# Patient Record
Sex: Male | Born: 1940
Health system: Southern US, Community
[De-identification: ages and names within clinical notes are randomized; demographics above are authoritative.]

## PROBLEM LIST (undated history)

## (undated) DIAGNOSIS — M545 Low back pain, unspecified: Secondary | ICD-10-CM

## (undated) DIAGNOSIS — R0981 Nasal congestion: Secondary | ICD-10-CM

## (undated) DIAGNOSIS — D369 Benign neoplasm, unspecified site: Secondary | ICD-10-CM

## (undated) DIAGNOSIS — K589 Irritable bowel syndrome without diarrhea: Secondary | ICD-10-CM

## (undated) DIAGNOSIS — K5732 Diverticulitis of large intestine without perforation or abscess without bleeding: Secondary | ICD-10-CM

## (undated) DIAGNOSIS — K625 Hemorrhage of anus and rectum: Secondary | ICD-10-CM

## (undated) DIAGNOSIS — H9313 Tinnitus, bilateral: Secondary | ICD-10-CM

## (undated) DIAGNOSIS — I10 Essential (primary) hypertension: Secondary | ICD-10-CM

## (undated) DIAGNOSIS — C44311 Basal cell carcinoma of skin of nose: Secondary | ICD-10-CM

## (undated) DIAGNOSIS — Z923 Personal history of irradiation: Secondary | ICD-10-CM

## (undated) DIAGNOSIS — M109 Gout, unspecified: Secondary | ICD-10-CM

## (undated) DIAGNOSIS — C4A39 Merkel cell carcinoma of other parts of face: Secondary | ICD-10-CM

## (undated) DIAGNOSIS — N400 Enlarged prostate without lower urinary tract symptoms: Secondary | ICD-10-CM

## (undated) DIAGNOSIS — C44329 Squamous cell carcinoma of skin of other parts of face: Secondary | ICD-10-CM

## (undated) DIAGNOSIS — E785 Hyperlipidemia, unspecified: Secondary | ICD-10-CM

## (undated) DIAGNOSIS — M1990 Unspecified osteoarthritis, unspecified site: Secondary | ICD-10-CM

## (undated) DIAGNOSIS — K5730 Diverticulosis of large intestine without perforation or abscess without bleeding: Secondary | ICD-10-CM

## (undated) DIAGNOSIS — R0602 Shortness of breath: Secondary | ICD-10-CM

## (undated) DIAGNOSIS — K429 Umbilical hernia without obstruction or gangrene: Secondary | ICD-10-CM

## (undated) HISTORY — DX: Hyperlipidemia, unspecified: E78.5

## (undated) HISTORY — DX: Merkel cell carcinoma of other parts of face: C4A.39

## (undated) HISTORY — DX: Essential (primary) hypertension: I10

## (undated) HISTORY — DX: Diverticulitis of large intestine without perforation or abscess without bleeding: K57.32

## (undated) HISTORY — DX: Low back pain, unspecified: M54.50

## (undated) HISTORY — DX: Benign neoplasm, unspecified site: D36.9

## (undated) HISTORY — DX: Low back pain: M54.5

## (undated) HISTORY — DX: Irritable bowel syndrome, unspecified: K58.9

## (undated) HISTORY — DX: Unspecified osteoarthritis, unspecified site: M19.90

## (undated) HISTORY — DX: Benign prostatic hyperplasia without lower urinary tract symptoms: N40.0

## (undated) HISTORY — DX: Hemorrhage of anus and rectum: K62.5

## (undated) HISTORY — DX: Diverticulosis of large intestine without perforation or abscess without bleeding: K57.30

## (undated) HISTORY — DX: Nasal congestion: R09.81

## (undated) HISTORY — DX: Squamous cell carcinoma of skin of other parts of face: C44.329

## (undated) HISTORY — PX: OTHER SURGICAL HISTORY: SHX169

---

## 2004-06-13 ENCOUNTER — Ambulatory Visit: Payer: Self-pay | Admitting: Internal Medicine

## 2004-06-19 ENCOUNTER — Ambulatory Visit: Payer: Self-pay | Admitting: Internal Medicine

## 2004-08-07 ENCOUNTER — Ambulatory Visit: Payer: Self-pay | Admitting: Internal Medicine

## 2004-12-06 ENCOUNTER — Ambulatory Visit (HOSPITAL_COMMUNITY): Admission: RE | Admit: 2004-12-06 | Discharge: 2004-12-06 | Payer: Self-pay | Admitting: Emergency Medicine

## 2005-07-31 ENCOUNTER — Ambulatory Visit: Payer: Self-pay | Admitting: Internal Medicine

## 2005-08-03 ENCOUNTER — Ambulatory Visit: Payer: Self-pay | Admitting: Internal Medicine

## 2006-08-29 ENCOUNTER — Ambulatory Visit: Payer: Self-pay | Admitting: Internal Medicine

## 2006-08-29 LAB — CONVERTED CEMR LAB
ALT: 15 units/L (ref 0–53)
AST: 21 units/L (ref 0–37)
Albumin: 4.1 g/dL (ref 3.5–5.2)
Alkaline Phosphatase: 103 units/L (ref 39–117)
BUN: 8 mg/dL (ref 6–23)
Basophils Absolute: 0 10*3/uL (ref 0.0–0.1)
Basophils Relative: 0.5 % (ref 0.0–1.0)
Bilirubin Urine: NEGATIVE
Bilirubin, Direct: 0.1 mg/dL (ref 0.0–0.3)
CO2: 28 meq/L (ref 19–32)
Calcium: 9.4 mg/dL (ref 8.4–10.5)
Chloride: 109 meq/L (ref 96–112)
Cholesterol: 226 mg/dL (ref 0–200)
Creatinine, Ser: 0.9 mg/dL (ref 0.4–1.5)
Direct LDL: 99.7 mg/dL
Eosinophils Absolute: 0.3 10*3/uL (ref 0.0–0.6)
Eosinophils Relative: 3.9 % (ref 0.0–5.0)
GFR calc Af Amer: 109 mL/min
GFR calc non Af Amer: 90 mL/min
Glucose, Bld: 105 mg/dL — ABNORMAL HIGH (ref 70–99)
HCT: 44.5 % (ref 39.0–52.0)
HDL: 30.7 mg/dL — ABNORMAL LOW (ref >39.0)
Hemoglobin: 15.2 g/dL (ref 13.0–17.0)
Ketones, ur: NEGATIVE mg/dL
Leukocytes, UA: NEGATIVE
Lymphocytes Relative: 19.5 % (ref 12.0–46.0)
MCHC: 34.2 g/dL (ref 30.0–36.0)
MCV: 91.6 fL (ref 78.0–100.0)
Monocytes Absolute: 0.7 10*3/uL (ref 0.2–0.7)
Monocytes Relative: 8.6 % (ref 3.0–11.0)
Neutro Abs: 5.4 10*3/uL (ref 1.4–7.7)
Neutrophils Relative %: 67.5 % (ref 43.0–77.0)
Nitrite: NEGATIVE
PSA: 3.33 ng/mL (ref 0.10–4.00)
Platelets: 215 10*3/uL (ref 150–400)
Potassium: 4.7 meq/L (ref 3.5–5.1)
RBC: 4.86 M/uL (ref 4.22–5.81)
RDW: 12.2 % (ref 11.5–14.6)
Sodium: 142 meq/L (ref 135–145)
Specific Gravity, Urine: 1.01 (ref 1.000–1.03)
TSH: 1.51 microintl units/mL (ref 0.35–5.50)
Total Bilirubin: 1.1 mg/dL (ref 0.3–1.2)
Total CHOL/HDL Ratio: 7.4
Total Protein, Urine: NEGATIVE mg/dL
Total Protein: 7 g/dL (ref 6.0–8.3)
Triglycerides: 468 mg/dL (ref 0–149)
Uric Acid, Serum: 7.6 mg/dL — ABNORMAL HIGH (ref 2.4–7.0)
Urine Glucose: NEGATIVE mg/dL
Urobilinogen, UA: 0.2 (ref 0.0–1.0)
VLDL: 94 mg/dL — ABNORMAL HIGH (ref 0–40)
WBC: 8 10*3/uL (ref 4.5–10.5)
pH: 8 (ref 5.0–8.0)

## 2006-10-30 ENCOUNTER — Encounter: Payer: Self-pay | Admitting: Internal Medicine

## 2006-10-30 DIAGNOSIS — K5730 Diverticulosis of large intestine without perforation or abscess without bleeding: Secondary | ICD-10-CM | POA: Insufficient documentation

## 2006-10-30 DIAGNOSIS — M109 Gout, unspecified: Secondary | ICD-10-CM

## 2006-10-30 DIAGNOSIS — I10 Essential (primary) hypertension: Secondary | ICD-10-CM | POA: Insufficient documentation

## 2006-10-30 DIAGNOSIS — E785 Hyperlipidemia, unspecified: Secondary | ICD-10-CM

## 2006-11-18 ENCOUNTER — Ambulatory Visit: Payer: Self-pay | Admitting: Internal Medicine

## 2006-11-18 DIAGNOSIS — E291 Testicular hypofunction: Secondary | ICD-10-CM | POA: Insufficient documentation

## 2006-11-18 DIAGNOSIS — D485 Neoplasm of uncertain behavior of skin: Secondary | ICD-10-CM

## 2006-11-18 DIAGNOSIS — F5102 Adjustment insomnia: Secondary | ICD-10-CM | POA: Insufficient documentation

## 2006-11-19 ENCOUNTER — Encounter: Payer: Self-pay | Admitting: Internal Medicine

## 2006-11-19 DIAGNOSIS — M545 Low back pain, unspecified: Secondary | ICD-10-CM | POA: Insufficient documentation

## 2006-11-19 DIAGNOSIS — M1990 Unspecified osteoarthritis, unspecified site: Secondary | ICD-10-CM | POA: Insufficient documentation

## 2006-12-18 ENCOUNTER — Ambulatory Visit: Payer: Self-pay | Admitting: Gastroenterology

## 2006-12-21 HISTORY — PX: SQUAMOUS CELL CARCINOMA EXCISION: SHX2433

## 2006-12-31 ENCOUNTER — Encounter: Payer: Self-pay | Admitting: Internal Medicine

## 2007-03-13 ENCOUNTER — Ambulatory Visit: Payer: Self-pay | Admitting: Internal Medicine

## 2007-03-13 DIAGNOSIS — J069 Acute upper respiratory infection, unspecified: Secondary | ICD-10-CM | POA: Insufficient documentation

## 2007-04-06 DIAGNOSIS — D369 Benign neoplasm, unspecified site: Secondary | ICD-10-CM

## 2007-04-06 HISTORY — DX: Benign neoplasm, unspecified site: D36.9

## 2007-04-07 ENCOUNTER — Ambulatory Visit: Payer: Self-pay | Admitting: Gastroenterology

## 2007-04-15 ENCOUNTER — Ambulatory Visit: Payer: Self-pay | Admitting: Gastroenterology

## 2007-04-15 ENCOUNTER — Encounter: Payer: Self-pay | Admitting: Gastroenterology

## 2007-05-06 DIAGNOSIS — Z87891 Personal history of nicotine dependence: Secondary | ICD-10-CM | POA: Insufficient documentation

## 2007-05-06 DIAGNOSIS — M129 Arthropathy, unspecified: Secondary | ICD-10-CM | POA: Insufficient documentation

## 2007-05-06 DIAGNOSIS — K5732 Diverticulitis of large intestine without perforation or abscess without bleeding: Secondary | ICD-10-CM

## 2007-05-06 DIAGNOSIS — M159 Polyosteoarthritis, unspecified: Secondary | ICD-10-CM

## 2007-05-06 DIAGNOSIS — N400 Enlarged prostate without lower urinary tract symptoms: Secondary | ICD-10-CM | POA: Insufficient documentation

## 2007-05-06 DIAGNOSIS — D126 Benign neoplasm of colon, unspecified: Secondary | ICD-10-CM | POA: Insufficient documentation

## 2007-05-06 DIAGNOSIS — Z85828 Personal history of other malignant neoplasm of skin: Secondary | ICD-10-CM | POA: Insufficient documentation

## 2007-05-06 HISTORY — DX: Diverticulitis of large intestine without perforation or abscess without bleeding: K57.32

## 2007-05-26 ENCOUNTER — Ambulatory Visit: Payer: Self-pay | Admitting: Internal Medicine

## 2007-08-11 ENCOUNTER — Telehealth: Payer: Self-pay | Admitting: Internal Medicine

## 2007-08-22 ENCOUNTER — Telehealth: Payer: Self-pay | Admitting: Internal Medicine

## 2007-08-26 ENCOUNTER — Telehealth (INDEPENDENT_AMBULATORY_CARE_PROVIDER_SITE_OTHER): Payer: Self-pay | Admitting: *Deleted

## 2007-09-03 ENCOUNTER — Ambulatory Visit: Payer: Self-pay | Admitting: Internal Medicine

## 2007-09-03 DIAGNOSIS — R350 Frequency of micturition: Secondary | ICD-10-CM

## 2007-09-08 ENCOUNTER — Telehealth: Payer: Self-pay | Admitting: Internal Medicine

## 2007-09-23 ENCOUNTER — Encounter: Payer: Self-pay | Admitting: Internal Medicine

## 2007-11-17 ENCOUNTER — Encounter: Payer: Self-pay | Admitting: Internal Medicine

## 2007-11-24 ENCOUNTER — Ambulatory Visit: Payer: Self-pay | Admitting: Internal Medicine

## 2007-12-15 ENCOUNTER — Encounter: Payer: Self-pay | Admitting: Internal Medicine

## 2007-12-16 ENCOUNTER — Ambulatory Visit: Payer: Self-pay | Admitting: Internal Medicine

## 2007-12-16 DIAGNOSIS — R21 Rash and other nonspecific skin eruption: Secondary | ICD-10-CM | POA: Insufficient documentation

## 2007-12-16 DIAGNOSIS — M79609 Pain in unspecified limb: Secondary | ICD-10-CM | POA: Insufficient documentation

## 2008-05-31 ENCOUNTER — Ambulatory Visit: Payer: Self-pay | Admitting: Internal Medicine

## 2008-06-03 LAB — CONVERTED CEMR LAB
ALT: 20 units/L (ref 0–53)
AST: 25 units/L (ref 0–37)
Albumin: 3.8 g/dL (ref 3.5–5.2)
Alkaline Phosphatase: 101 units/L (ref 39–117)
BUN: 13 mg/dL (ref 6–23)
Bilirubin Urine: NEGATIVE
Bilirubin, Direct: 0.1 mg/dL (ref 0.0–0.3)
CO2: 31 meq/L (ref 19–32)
Calcium: 9.5 mg/dL (ref 8.4–10.5)
Chloride: 109 meq/L (ref 96–112)
Creatinine, Ser: 1 mg/dL (ref 0.4–1.5)
GFR calc non Af Amer: 79.22 mL/min (ref >60)
Glucose, Bld: 117 mg/dL — ABNORMAL HIGH (ref 70–99)
Hemoglobin, Urine: NEGATIVE
Ketones, ur: NEGATIVE mg/dL
Leukocytes, UA: NEGATIVE
Nitrite: NEGATIVE
Potassium: 4.5 meq/L (ref 3.5–5.1)
Sodium: 144 meq/L (ref 135–145)
Specific Gravity, Urine: 1.015 (ref 1.000–1.030)
TSH: 1.16 microintl units/mL (ref 0.35–5.50)
Total Bilirubin: 1 mg/dL (ref 0.3–1.2)
Total Protein, Urine: NEGATIVE mg/dL
Total Protein: 6.8 g/dL (ref 6.0–8.3)
Uric Acid, Serum: 7.7 mg/dL (ref 4.0–7.8)
Urine Glucose: NEGATIVE mg/dL
Urobilinogen, UA: 0.2 (ref 0.0–1.0)
pH: 6 (ref 5.0–8.0)

## 2008-11-29 ENCOUNTER — Ambulatory Visit: Payer: Self-pay | Admitting: Internal Medicine

## 2009-05-23 ENCOUNTER — Ambulatory Visit: Payer: Self-pay | Admitting: Internal Medicine

## 2009-05-23 LAB — CONVERTED CEMR LAB
ALT: 24 units/L (ref 0–53)
AST: 27 units/L (ref 0–37)
Albumin: 4 g/dL (ref 3.5–5.2)
Alkaline Phosphatase: 87 units/L (ref 39–117)
BUN: 11 mg/dL (ref 6–23)
Basophils Absolute: 0 10*3/uL (ref 0.0–0.1)
Basophils Relative: 0.1 % (ref 0.0–3.0)
Bilirubin Urine: NEGATIVE
Bilirubin, Direct: 0.2 mg/dL (ref 0.0–0.3)
CO2: 33 meq/L — ABNORMAL HIGH (ref 19–32)
Calcium: 9.6 mg/dL (ref 8.4–10.5)
Chloride: 105 meq/L (ref 96–112)
Cholesterol: 221 mg/dL — ABNORMAL HIGH (ref 0–200)
Creatinine, Ser: 1.1 mg/dL (ref 0.4–1.5)
Direct LDL: 130.3 mg/dL
Eosinophils Absolute: 0.2 10*3/uL (ref 0.0–0.7)
Eosinophils Relative: 3.3 % (ref 0.0–5.0)
GFR calc non Af Amer: 70.76 mL/min (ref >60)
Glucose, Bld: 119 mg/dL — ABNORMAL HIGH (ref 70–99)
HCT: 45.8 % (ref 39.0–52.0)
HDL: 34.3 mg/dL — ABNORMAL LOW (ref >39.00)
Hemoglobin, Urine: NEGATIVE
Hemoglobin: 15.5 g/dL (ref 13.0–17.0)
Ketones, ur: NEGATIVE mg/dL
Leukocytes, UA: NEGATIVE
Lymphocytes Relative: 22.1 % (ref 12.0–46.0)
Lymphs Abs: 1.6 10*3/uL (ref 0.7–4.0)
MCHC: 33.9 g/dL (ref 30.0–36.0)
MCV: 94 fL (ref 78.0–100.0)
Monocytes Absolute: 0.6 10*3/uL (ref 0.1–1.0)
Monocytes Relative: 8.7 % (ref 3.0–12.0)
Neutro Abs: 4.8 10*3/uL (ref 1.4–7.7)
Neutrophils Relative %: 65.8 % (ref 43.0–77.0)
Nitrite: NEGATIVE
PSA: 2.43 ng/mL (ref 0.10–4.00)
Platelets: 175 10*3/uL (ref 150.0–400.0)
Potassium: 4.2 meq/L (ref 3.5–5.1)
RBC: 4.88 M/uL (ref 4.22–5.81)
RDW: 12 % (ref 11.5–14.6)
Sodium: 143 meq/L (ref 135–145)
Specific Gravity, Urine: 1.015 (ref 1.000–1.030)
TSH: 1.28 microintl units/mL (ref 0.35–5.50)
Total Bilirubin: 1.1 mg/dL (ref 0.3–1.2)
Total CHOL/HDL Ratio: 6
Total Protein: 7.4 g/dL (ref 6.0–8.3)
Triglycerides: 366 mg/dL — ABNORMAL HIGH (ref 0.0–149.0)
Uric Acid, Serum: 8.2 mg/dL — ABNORMAL HIGH (ref 4.0–7.8)
Urine Glucose: NEGATIVE mg/dL
Urobilinogen, UA: 0.2 (ref 0.0–1.0)
VLDL: 73.2 mg/dL — ABNORMAL HIGH (ref 0.0–40.0)
WBC: 7.2 10*3/uL (ref 4.5–10.5)
pH: 8 (ref 5.0–8.0)

## 2009-05-30 ENCOUNTER — Ambulatory Visit: Payer: Self-pay | Admitting: Internal Medicine

## 2009-11-28 ENCOUNTER — Ambulatory Visit: Payer: Self-pay | Admitting: Internal Medicine

## 2009-11-29 LAB — CONVERTED CEMR LAB
Hgb A1c MFr Bld: 5.6 % (ref 4.6–6.5)
PSA: 7.31 ng/mL — ABNORMAL HIGH (ref 0.10–4.00)
Uric Acid, Serum: 7.4 mg/dL (ref 4.0–7.8)

## 2009-12-05 ENCOUNTER — Ambulatory Visit: Payer: Self-pay | Admitting: Internal Medicine

## 2009-12-05 DIAGNOSIS — R972 Elevated prostate specific antigen [PSA]: Secondary | ICD-10-CM

## 2010-03-01 ENCOUNTER — Ambulatory Visit: Payer: Self-pay | Admitting: Internal Medicine

## 2010-03-02 ENCOUNTER — Encounter: Payer: Self-pay | Admitting: Internal Medicine

## 2010-03-02 LAB — CONVERTED CEMR LAB
ALT: 29 units/L (ref 0–53)
AST: 32 units/L (ref 0–37)
Albumin: 3.7 g/dL (ref 3.5–5.2)
Alkaline Phosphatase: 90 units/L (ref 39–117)
BUN: 14 mg/dL (ref 6–23)
Basophils Absolute: 0.1 10*3/uL (ref 0.0–0.1)
Basophils Relative: 0.6 % (ref 0.0–3.0)
Bilirubin Urine: NEGATIVE
Bilirubin, Direct: 0.1 mg/dL (ref 0.0–0.3)
CO2: 26 meq/L (ref 19–32)
Calcium: 9 mg/dL (ref 8.4–10.5)
Chloride: 104 meq/L (ref 96–112)
Cholesterol: 224 mg/dL — ABNORMAL HIGH (ref 0–200)
Creatinine, Ser: 0.9 mg/dL (ref 0.4–1.5)
Direct LDL: 152.3 mg/dL
Eosinophils Absolute: 0.4 10*3/uL (ref 0.0–0.7)
Eosinophils Relative: 4.8 % (ref 0.0–5.0)
GFR calc non Af Amer: 85.69 mL/min (ref >60.00)
Glucose, Bld: 96 mg/dL (ref 70–99)
HCT: 46.7 % (ref 39.0–52.0)
HDL: 29.2 mg/dL — ABNORMAL LOW (ref >39.00)
Hemoglobin: 15.8 g/dL (ref 13.0–17.0)
Ketones, ur: NEGATIVE mg/dL
Leukocytes, UA: NEGATIVE
Lymphocytes Relative: 26.6 % (ref 12.0–46.0)
Lymphs Abs: 2.2 10*3/uL (ref 0.7–4.0)
MCHC: 33.9 g/dL (ref 30.0–36.0)
MCV: 92.9 fL (ref 78.0–100.0)
Monocytes Absolute: 0.8 10*3/uL (ref 0.1–1.0)
Monocytes Relative: 9.9 % (ref 3.0–12.0)
Neutro Abs: 4.8 10*3/uL (ref 1.4–7.7)
Neutrophils Relative %: 58.1 % (ref 43.0–77.0)
Nitrite: NEGATIVE
Platelets: 179 10*3/uL (ref 150.0–400.0)
Potassium: 4.4 meq/L (ref 3.5–5.1)
RBC: 5.03 M/uL (ref 4.22–5.81)
RDW: 13 % (ref 11.5–14.6)
Sodium: 139 meq/L (ref 135–145)
Specific Gravity, Urine: 1.015 (ref 1.000–1.030)
TSH: 2.3 microintl units/mL (ref 0.35–5.50)
Total Bilirubin: 0.8 mg/dL (ref 0.3–1.2)
Total CHOL/HDL Ratio: 8
Total Protein, Urine: NEGATIVE mg/dL
Total Protein: 6.8 g/dL (ref 6.0–8.3)
Triglycerides: 285 mg/dL — ABNORMAL HIGH (ref 0.0–149.0)
Urine Glucose: NEGATIVE mg/dL
Urobilinogen, UA: 0.2 (ref 0.0–1.0)
VLDL: 57 mg/dL — ABNORMAL HIGH (ref 0.0–40.0)
WBC: 8.2 10*3/uL (ref 4.5–10.5)
pH: 6.5 (ref 5.0–8.0)

## 2010-03-04 LAB — CONVERTED CEMR LAB
PSA, Free Pct: 28 (ref >25)
PSA, Free: 0.7 ng/mL
PSA: 2.49 ng/mL (ref <=4.00)

## 2010-03-10 ENCOUNTER — Ambulatory Visit: Admit: 2010-03-10 | Payer: Self-pay | Admitting: Internal Medicine

## 2010-03-17 ENCOUNTER — Ambulatory Visit
Admission: RE | Admit: 2010-03-17 | Discharge: 2010-03-17 | Payer: Self-pay | Source: Home / Self Care | Attending: Internal Medicine | Admitting: Internal Medicine

## 2010-03-17 DIAGNOSIS — H6120 Impacted cerumen, unspecified ear: Secondary | ICD-10-CM | POA: Insufficient documentation

## 2010-04-06 NOTE — Assessment & Plan Note (Signed)
Summary: 6 mo rov /nws #   Vital Signs:  Patient profile:   70 year old male Height:      68 inches Weight:      184 pounds BMI:     28.08 Temp:     97.8 degrees F oral Pulse rate:   75 / minute BP sitting:   152 / 70  (left arm)  Vitals Entered By: Tora Perches (May 30, 2009 2:37 PM) CC: f/u Is Patient Diabetic? No   CC:  f/u.  History of Present Illness: The patient presents for a follow up of hypertension, IBS, hyperlipidemia   Preventive Screening-Counseling & Management  Alcohol-Tobacco     Smoking Status: never  Current Medications (verified): 1)  Lisinopril 20 Mg Tabs (Lisinopril) .... 2 Once Daily 2)  Indomethacin 50 Mg Caps (Indomethacin) .Marland Kitchen.. 1 By Mouth Three Times A Day Pc As Needed Gout 3)  Triamcinolone Acetonide 0.5 % Crea (Triamcinolone Acetonide) .... Apply Bid To Affected Area 4)  Calcium 1200mg  + Vitamin D .... Once Daily 5)  Omega 3 Fatty Acids 6)  Multi-Vitamin  Tabs (Multiple Vitamin) .... Once Daily 7)  Coenzyme Q10 60 Mg Tabs (Coenzyme Q10) .... Once Daily 8)  Oxymetazoline Hydrochloride .... Nasal Spray Two Times A Day  Allergies: 1)  ! Penicillin 2)  ! Sulfa 3)  ! Lipitor 4)  ! Lopid 5)  ! Lasix (Furosemide) 6)  Verapamil 7)  Lisinopril (Lisinopril) 8)  Metoprolol Tartrate (Metoprolol Tartrate) 9)  Hytrin  Past History:  Past Medical History: Diverticulitis, hx of Diverticulosis, colon Gout Hyperlipidemia Hypertension Dr Melburn Popper Low back pain Osteoarthritis Benign prostatic hypertrophy IBS  Dr Russella Dar C/D  Social History: Smoking Status:  never  Physical Exam  General:  Well-developed,well-nourished,in no acute distress; alert,appropriate and cooperative throughout examination Mouth:  Oral mucosa and oropharynx without lesions or exudates.  Teeth in good repair. Neck:  No deformities, masses, or tenderness noted. Lungs:  Normal respiratory effort, chest expands symmetrically. Lungs are clear to auscultation, no crackles  or wheezes. Heart:  Normal rate and regular rhythm. S1 and S2 normal without gallop, murmur, click, rub or other extra sounds. Abdomen:  Bowel sounds positive,abdomen soft and non-tender without masses, organomegaly or hernias noted. Msk:  No deformity or scoliosis noted of thoracic or lumbar spine.   Neurologic:  No cranial nerve deficits noted. Station and gait are normal. Plantar reflexes are down-going bilaterally. DTRs are symmetrical throughout. Sensory, motor and coordinative functions appear intact. Skin:  Intact without suspicious lesions or rashes Psych:  Cognition and judgment appear intact. Alert and cooperative with normal attention span and concentration. No apparent delusions, illusions, hallucinations   Impression & Recommendations:  Problem # 1:  IBS (ICD-564.1) Assessment Unchanged He is reading a "Gut solutions" book and planning a new diet He is going to sch a GI appt  Problem # 2:  HYPERTENSION (ICD-401.9) Assessment: Unchanged  His updated medication list for this problem includes:    Lisinopril 20 Mg Tabs (Lisinopril) .Marland Kitchen... 2 once daily (he was taking 1 a day)  Problem # 3:  OSTEOARTHRITIS, GENERALIZED (ICD-715.00) Assessment: Unchanged  His updated medication list for this problem includes:    Indomethacin 50 Mg Caps (Indomethacin) .Marland Kitchen... 1 by mouth three times a day pc as needed gout    Tramadol Hcl 50 Mg Tabs (Tramadol hcl) .Marland Kitchen... 1-2 tabs by mouth two times a day as needed pain  Problem # 4:  HYPERLIPIDEMIA (ICD-272.4) Assessment: Unchanged on  a diet  Problem #  5:  GOUT (ICD-274.9) Assessment: Unchanged  Complete Medication List: 1)  Lisinopril 20 Mg Tabs (Lisinopril) .... 2 once daily 2)  Indomethacin 50 Mg Caps (Indomethacin) .Marland Kitchen.. 1 by mouth three times a day pc as needed gout 3)  Triamcinolone Acetonide 0.5 % Crea (Triamcinolone acetonide) .... Apply bid to affected area 4)  Calcium 1200mg  + Vitamin D  .... Once daily 5)  Omega 3 Fatty Acids  6)   Multi-vitamin Tabs (Multiple vitamin) .... Once daily 7)  Coenzyme Q10 60 Mg Tabs (Coenzyme q10) .... Once daily 8)  Oxymetazoline Hydrochloride  .... Nasal spray two times a day 9)  Tramadol Hcl 50 Mg Tabs (Tramadol hcl) .Marland Kitchen.. 1-2 tabs by mouth two times a day as needed pain  Patient Instructions: 1)  Please schedule a follow-up appointment in 6 months. 2)  uric acid 995.20  v70.0 3)  HbgA1C prior to visit, ICD-9: 4)  Try a gluten free diet x 1-2 months 5)  PSA  Prescriptions: INDOMETHACIN 50 MG CAPS (INDOMETHACIN) 1 by mouth three times a day pc as needed gout  #60 x 3   Entered and Authorized by:   Tresa Garter MD   Signed by:   Tresa Garter MD on 05/30/2009   Method used:   Print then Give to Patient   RxID:   [id number] TRAMADOL HCL 50 MG TABS (TRAMADOL HCL) 1-2 tabs by mouth two times a day as needed pain  #120 x 3   Entered and Authorized by:   Tresa Garter MD   Signed by:   Tresa Garter MD on 05/30/2009   Method used:   Print then Give to Patient   RxID:   [id number] LISINOPRIL 20 MG TABS (LISINOPRIL) 2 once daily  #60 x 12   Entered and Authorized by:   Tresa Garter MD   Signed by:   Tresa Garter MD on 05/30/2009   Method used:   Print then Give to Patient   RxID:   [id number]

## 2010-04-06 NOTE — Assessment & Plan Note (Signed)
Summary: patient  r/s from 11/28/09   Vital Signs:  Patient profile:   70 year old male Height:      68 inches Weight:      184 pounds BMI:     28.08 O2 Sat:      98 % on Room air Temp:     98.2 degrees F oral Pulse rate:   69 / minute BP sitting:   200 / 70  (left arm) Cuff size:   regular  Vitals Entered By: Alysia Penna (December 05, 2009 2:55 PM)  O2 Flow:  Room air CC: pt here for 6 mth follow up. /cp sma   CC:  pt here for 6 mth follow up. /cp sma.  History of Present Illness: The patient presents for a follow up of hypertension, BPH, hyperlipidemia   Current Medications (verified): 1)  Lisinopril 20 Mg Tabs (Lisinopril) .... 2 Once Daily 2)  Indomethacin 50 Mg Caps (Indomethacin) .Marland Kitchen.. 1 By Mouth Three Times A Day Pc As Needed Gout 3)  Triamcinolone Acetonide 0.5 % Crea (Triamcinolone Acetonide) .... Apply Bid To Affected Area 4)  Calcium 1200mg  + Vitamin D .... Once Daily 5)  Omega 3 Fatty Acids 6)  Multi-Vitamin  Tabs (Multiple Vitamin) .... Once Daily 7)  Coenzyme Q10 60 Mg Tabs (Coenzyme Q10) .... Once Daily 8)  Oxymetazoline Hydrochloride .... Nasal Spray Two Times A Day 9)  Tramadol Hcl 50 Mg Tabs (Tramadol Hcl) .Marland Kitchen.. 1-2 Tabs By Mouth Two Times A Day As Needed Pain  Allergies (verified): 1)  ! Penicillin 2)  ! Sulfa 3)  ! Lipitor 4)  ! Lopid 5)  ! Lasix (Furosemide) 6)  Verapamil 7)  Lisinopril (Lisinopril) 8)  Metoprolol Tartrate (Metoprolol Tartrate) 9)  Hytrin  Past History:  Past Medical History: Last updated: 05/30/2009 Diverticulitis, hx of Diverticulosis, colon Gout Hyperlipidemia Hypertension Dr Melburn Popper Low back pain Osteoarthritis Benign prostatic hypertrophy IBS  Dr Russella Dar C/D  Social History: Last updated: 11/18/2006 Retired Single Former Smoker Alcohol use-no  Review of Systems  The patient denies fever, chest pain, and abdominal pain.    Physical Exam  General:  Well-developed,well-nourished,in no acute distress;  alert,appropriate and cooperative throughout examination Nose:  External nasal examination shows no deformity or inflammation. Nasal mucosa are pink and moist without lesions or exudates. Mouth:  Oral mucosa and oropharynx without lesions or exudates.  Teeth in good repair. Lungs:  Normal respiratory effort, chest expands symmetrically. Lungs are clear to auscultation, no crackles or wheezes. Heart:  Normal rate and regular rhythm. S1 and S2 normal without gallop, murmur, click, rub or other extra sounds. Abdomen:  Bowel sounds positive,abdomen soft and non-tender without masses, organomegaly or hernias noted. Rectal:  Refused Prostate:  Refused Skin:  Intact without suspicious lesions or rashes Psych:  Cognition and judgment appear intact. Alert and cooperative with normal attention span and concentration. No apparent delusions, illusions, hallucinations   Impression & Recommendations:  Problem # 1:  HYPERTENSION (ICD-401.9) Assessment Deteriorated Refusing any added Rx due to side effects His updated medication list for this problem includes:    Lisinopril 20 Mg Tabs (Lisinopril) .Marland Kitchen... 2 once daily    Tekturna 300 Mg Tabs (Aliskiren fumarate) .Marland Kitchen... 1 by mouth qd  Problem # 2:  IBS (ICD-564.1) Assessment: Unchanged  Problem # 3:  HYPERLIPIDEMIA (ICD-272.4) Assessment: Unchanged Refusing Rx  Problem # 4:  OSTEOARTHRITIS (ICD-715.90) Assessment: Unchanged  His updated medication list for this problem includes:    Indomethacin 50 Mg Caps (Indomethacin) .Marland KitchenMarland KitchenMarland KitchenMarland Kitchen  1 by mouth three times a day pc as needed gout    Tramadol Hcl 50 Mg Tabs (Tramadol hcl) .Marland Kitchen... 1-2 tabs by mouth two times a day as needed pain  Problem # 5:  PSA, INCREASED (ICD-790.93) Assessment: Unchanged Will monitor Urol ref adviced  Complete Medication List: 1)  Lisinopril 20 Mg Tabs (Lisinopril) .... 2 once daily 2)  Indomethacin 50 Mg Caps (Indomethacin) .Marland Kitchen.. 1 by mouth three times a day pc as needed gout 3)   Triamcinolone Acetonide 0.5 % Crea (Triamcinolone acetonide) .... Apply bid to affected area 4)  Calcium 1200mg  + Vitamin D  .... Once daily 5)  Omega 3 Fatty Acids  6)  Multi-vitamin Tabs (Multiple vitamin) .... Once daily 7)  Coenzyme Q10 60 Mg Tabs (Coenzyme q10) .... Once daily 8)  Oxymetazoline Hydrochloride  .... Nasal spray two times a day 9)  Tramadol Hcl 50 Mg Tabs (Tramadol hcl) .Marland Kitchen.. 1-2 tabs by mouth two times a day as needed pain 10)  Tekturna 300 Mg Tabs (Aliskiren fumarate) .Marland Kitchen.. 1 by mouth qd  Patient Instructions: 1)  Please schedule a follow-up appointment in 3 months well w/labs. 2)  Free PSA 596.0 Prescriptions: TEKTURNA 300 MG TABS (ALISKIREN FUMARATE) 1 by mouth qd  #30 x 11   Entered and Authorized by:   Tresa Garter MD   Signed by:   Tresa Garter MD on 12/05/2009   Method used:   Print then Give to Patient   RxID:   [id number]

## 2010-04-06 NOTE — Assessment & Plan Note (Signed)
Summary: 3 mos f/u #/cd   Vital Signs:  Patient profile:   70 year old male Height:      68 inches Weight:      186 pounds BMI:     28.38 Temp:     97.9 degrees F oral Pulse rate:   84 / minute Pulse rhythm:   regular Resp:     16 per minute BP sitting:   184 / 90  (right arm) Cuff size:   regular  Vitals Entered By: Lanier Prude, CMA(AAMA) (March 17, 2010 9:07 AM) CC: 3 mo f/u  Is Patient Diabetic? No Comments pt is not taking Tekturna   CC:  3 mo f/u .  History of Present Illness: The patient presents for a follow up of hypertension, OA, hyperlipidemia  The patient presents for a preventive health examination  Patient past medical history, social history, and family history reviewed in detail no significant changes.  Patient is physically active. Depression is negative and mood is good. Hearing is normal, and able to perform activities of daily living. Risk of falling is negligible and home safety has been reviewed and is appropriate. Patient has normal height,he is a little overweight, and visual acuity is nl w/glasses. Patient has been counseled on age-appropriate routine health concerns for screening and prevention. Education, counseling done. Cognition is nl.  Preventive Screening-Counseling & Management  Alcohol-Tobacco     Alcohol drinks/day: 1     Smoking Status: quit > 6 months  Caffeine-Diet-Exercise     Caffeine Counseling: not indicated; caffeine use is not excessive or problematic     Diet Counseling: not indicated; diet is assessed to be healthy     Does Patient Exercise: yes     Times/week: 3     Depression Counseling: not indicated; screening negative for depression  Hep-HIV-STD-Contraception     Hepatitis Risk: no risk noted     Sun Exposure-Excessive: no  Safety-Violence-Falls     Seat Belt Use: yes     Fall Risk Counseling: not indicated; no significant falls noted  Current Medications (verified): 1)  Lisinopril 20 Mg Tabs (Lisinopril) .... 2  Once Daily 2)  Indomethacin 50 Mg Caps (Indomethacin) .Marland Kitchen.. 1 By Mouth Three Times A Day Pc As Needed Gout 3)  Triamcinolone Acetonide 0.5 % Crea (Triamcinolone Acetonide) .... Apply Bid To Affected Area 4)  Calcium 1200mg  + Vitamin D .... Once Daily 5)  Omega 3 Fatty Acids 6)  Multi-Vitamin  Tabs (Multiple Vitamin) .... Once Daily 7)  Coenzyme Q10 60 Mg Tabs (Coenzyme Q10) .... Once Daily 8)  Oxymetazoline Hydrochloride .... Nasal Spray Two Times A Day 9)  Tramadol Hcl 50 Mg Tabs (Tramadol Hcl) .Marland Kitchen.. 1-2 Tabs By Mouth Two Times A Day As Needed Pain 10)  Tekturna 300 Mg Tabs (Aliskiren Fumarate) .Marland Kitchen.. 1 By Mouth Qd  Allergies (verified): 1)  ! Penicillin 2)  ! Sulfa 3)  ! Lipitor 4)  ! Lopid 5)  ! Lasix (Furosemide) 6)  Verapamil 7)  Lisinopril (Lisinopril) 8)  Metoprolol Tartrate (Metoprolol Tartrate) 9)  Hytrin  Social History: Smoking Status:  quit > 6 months Does Patient Exercise:  yes Hepatitis Risk:  no risk noted Sun Exposure-Excessive:  no Seat Belt Use:  yes  Physical Exam  General:  Well-developed,well-nourished,in no acute distress; alert,appropriate and cooperative throughout examination Eyes:  No corneal or conjunctival inflammation noted. EOMI. Perrla. Ears:  B wax Nose:  External nasal examination shows no deformity or inflammation. Nasal mucosa are pink and moist  without lesions or exudates. Mouth:  Oral mucosa and oropharynx without lesions or exudates.  Teeth in good repair. Abdomen:  Bowel sounds positive,abdomen soft and non-tender without masses, organomegaly or hernias noted. Msk:  No deformity or scoliosis noted of thoracic or lumbar spine.   Neurologic:  No cranial nerve deficits noted. Station and gait are normal. Plantar reflexes are down-going bilaterally. DTRs are symmetrical throughout. Sensory, motor and coordinative functions appear intact. Skin:  R cheek possible skin Ca, AKs Psych:  Cognition and judgment appear intact. Alert and cooperative with  normal attention span and concentration. No apparent delusions, illusions, hallucinations   Impression & Recommendations:  Problem # 1:  HYPERTENSION (ICD-401.9) Assessment Unchanged  The following medications were removed from the medication list:    Tekturna 300 Mg Tabs (Aliskiren fumarate) .Marland Kitchen... 1 by mouth qd His updated medication list for this problem includes:    Lisinopril 20 Mg Tabs (Lisinopril) .Marland Kitchen... 2 once daily    Bystolic 20 Mg Tabs (Nebivolol hcl) .Marland Kitchen... 1 by mouth once daily for blood pressure TO TRY - samples for 5 mg a day given  Problem # 2:  LOW BACK PAIN (ICD-724.2) Assessment: Unchanged  His updated medication list for this problem includes:    Indomethacin 50 Mg Caps (Indomethacin) .Marland Kitchen... 1 by mouth three times a day pc as needed gout    Tramadol Hcl 50 Mg Tabs (Tramadol hcl) .Marland Kitchen... 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 Compliance encouraged.   Problem # 7:  CERUMEN IMPACTION (ICD-380.4) B Assessment: Deteriorated He declined irrigation  Problem # 8:  HEALTH MAINTENANCE EXAM (ICD-V70.0) Assessment: New  Orders: Medicare -1st Annual Wellness Visit [phone]) Overall doing well, age appropriate education and counseling updated and referral for appropriate preventive services done unless declined, immunizations up to date or declined, diet counseling done if overweight, urged to quit smoking if smokes, most recent labs reviewed and current ordered if appropriate, ecg reviewed or declined (interpretation per ECG scanned in the EMR if done); information regarding Medicare Preventation  requirements given if appropriate.  I have personally reviewed the Medicare Annual Wellness questionnaire and have noted 1.   The patient's medical and social history 2.   Their use of alcohol, tobacco or illicit drugs 3.   Their current medications and supplements 4.   The patient's functional ability including ADL's, fall risks, home safety risks and hearing or visual             impairment. 5.   Diet and physical activities 6.   Evidence for depression or mood disorders The patients weight, height, BMI and visual acuity have been recorded in the chart I have made referrals, counseling and provided education to the patient based review of the above and I have provided the pt with a written personalized care plan for preventive services.  Complete Medication List: 1)  Calcium 1200mg  + Vitamin D  .... Once daily 2)  Lisinopril 20 Mg Tabs (Lisinopril) .... 2 once daily 3)  Indomethacin 50 Mg Caps (Indomethacin) .Marland Kitchen.. 1 by mouth three times a day pc as needed gout 4)  Triamcinolone Acetonide 0.5 % Crea (Triamcinolone acetonide) .... Apply bid to  affected area 5)  Multi-vitamin Tabs (Multiple vitamin) .... Once daily 6)  Coenzyme Q10 60 Mg Tabs (Coenzyme q10) .... Once daily 7)  Tramadol Hcl 50 Mg Tabs (Tramadol hcl) .Marland Kitchen.. 1-2 tabs by mouth two times a day as needed pain 8)  Oxymetazoline Hydrochloride  .... Nasal spray two times a day 9)  Omega 3 Fatty Acids  10)  Bystolic 20 Mg Tabs (Nebivolol hcl) .Marland Kitchen.. 1 by mouth once daily for blood pressure  Patient Instructions: 1)  Please schedule a follow-up appointment in 3-4 months. 2)  BMP prior to visit, ICD-9:401.1 3)  PSA prior to visit, ICD-9: 596.0 Prescriptions: BYSTOLIC 20 MG TABS (NEBIVOLOL HCL) 1 by mouth once daily for blood pressure  #90 x 3   Entered and Authorized by:   Tresa Garter MD   Signed by:   Tresa Garter MD on 03/17/2010   Method used:   Print then Give to Patient   RxID:   [id number]    Orders  Added: 1)  Medicare -1st Annual Wellness Visit [[id number]] 2)  Est. Patient Level IV [56213]

## 2010-06-23 ENCOUNTER — Other Ambulatory Visit: Payer: Self-pay | Admitting: Internal Medicine

## 2010-06-23 ENCOUNTER — Other Ambulatory Visit (INDEPENDENT_AMBULATORY_CARE_PROVIDER_SITE_OTHER): Payer: Self-pay

## 2010-06-23 DIAGNOSIS — I10 Essential (primary) hypertension: Secondary | ICD-10-CM

## 2010-06-23 DIAGNOSIS — N320 Bladder-neck obstruction: Secondary | ICD-10-CM

## 2010-06-23 LAB — BASIC METABOLIC PANEL
CO2: 29 mEq/L (ref 19–32)
Calcium: 9.4 mg/dL (ref 8.4–10.5)
Chloride: 104 mEq/L (ref 96–112)
Creatinine, Ser: 1 mg/dL (ref 0.4–1.5)
GFR: 78.74 mL/min (ref >60.00)
Glucose, Bld: 86 mg/dL (ref 70–99)
Potassium: 4.8 mEq/L (ref 3.5–5.1)

## 2010-06-23 LAB — PSA: PSA: 20.3 ng/mL — ABNORMAL HIGH (ref 0.10–4.00)

## 2010-06-24 ENCOUNTER — Telehealth: Payer: Self-pay | Admitting: Internal Medicine

## 2010-06-24 NOTE — Telephone Encounter (Signed)
Keep ROV 

## 2010-06-30 ENCOUNTER — Ambulatory Visit (INDEPENDENT_AMBULATORY_CARE_PROVIDER_SITE_OTHER): Payer: Medicare Other | Admitting: Internal Medicine

## 2010-06-30 ENCOUNTER — Encounter: Payer: Self-pay | Admitting: Internal Medicine

## 2010-06-30 DIAGNOSIS — R972 Elevated prostate specific antigen [PSA]: Secondary | ICD-10-CM

## 2010-06-30 DIAGNOSIS — R198 Other specified symptoms and signs involving the digestive system and abdomen: Secondary | ICD-10-CM

## 2010-06-30 DIAGNOSIS — I10 Essential (primary) hypertension: Secondary | ICD-10-CM

## 2010-06-30 DIAGNOSIS — R350 Frequency of micturition: Secondary | ICD-10-CM

## 2010-06-30 DIAGNOSIS — R194 Change in bowel habit: Secondary | ICD-10-CM

## 2010-06-30 MED ORDER — LISINOPRIL 20 MG PO TABS: 40.0000 mg | ORAL_TABLET | Freq: Every day | ORAL | Status: DC

## 2010-06-30 NOTE — Assessment & Plan Note (Signed)
Worse. We discussed it. Urology appt was advised. He declined.

## 2010-06-30 NOTE — Progress Notes (Signed)
  Subjective:    Patient ID: Mario Berg, male    DOB: 09-30-1940, 70 y.o.   MRN: [id number]  HPI  The patient presents for a follow-up of  chronic hypertension, chronic dyslipidemia, BPH controlled with medicines. He had stop Bystolic - bladder complications. C/o sence of incomplete evacuation x months.    Review of Systems  Constitutional: Negative for appetite change and fatigue.  HENT: Negative for rhinorrhea.   Eyes: Negative for pain.  Gastrointestinal: Positive for constipation. Negative for diarrhea, blood in stool, abdominal distention, anal bleeding and rectal pain.  Genitourinary: Positive for dysuria (burning). Negative for flank pain, discharge and penile pain.  Musculoskeletal: Negative for myalgias and joint swelling.  Psychiatric/Behavioral: Negative for confusion.       Objective:   Physical Exam  Constitutional: He is oriented to person, place, and time. He appears well-developed.  HENT:  Mouth/Throat: Oropharynx is clear and moist.  Eyes: Conjunctivae are normal. Pupils are equal, round, and reactive to light.  Neck: Normal range of motion. No JVD present. No thyromegaly present.  Cardiovascular: Normal rate, regular rhythm, normal heart sounds and intact distal pulses.  Exam reveals no gallop and no friction rub.   No murmur heard. Pulmonary/Chest: Effort normal and breath sounds normal. No respiratory distress. He has no wheezes. He has no rales. He exhibits no tenderness.  Abdominal: Soft. Bowel sounds are normal. He exhibits no distension and no mass. There is no tenderness. There is no rebound and no guarding.  Musculoskeletal: Normal range of motion. He exhibits no edema and no tenderness.  Lymphadenopathy:    He has no cervical adenopathy.  Neurological: He is alert and oriented to person, place, and time. He has normal reflexes. No cranial nerve deficit. He exhibits normal muscle tone. Coordination normal.  Skin: Skin is warm and dry. No rash noted.    Psychiatric: He has a normal mood and affect. His behavior is normal. Judgment and thought content normal.          Assessment & Plan:  HYPERTENSION He had to stop Bystolic due to side effects  PSA, INCREASED Worse. We discussed it. Urology appt was advised. He declined at first, then agreed.  FREQUENCY, URINARY It was worse while on Bystolic.  Bowel habit changes  Schedule GI appt

## 2010-06-30 NOTE — Assessment & Plan Note (Signed)
He had to stop Bystolic due to side effects

## 2010-06-30 NOTE — Assessment & Plan Note (Signed)
It was worse while on Bystolic.

## 2010-07-04 ENCOUNTER — Encounter: Payer: Self-pay | Admitting: Internal Medicine

## 2010-07-12 ENCOUNTER — Encounter: Payer: Self-pay | Admitting: Gastroenterology

## 2010-07-12 ENCOUNTER — Ambulatory Visit (INDEPENDENT_AMBULATORY_CARE_PROVIDER_SITE_OTHER): Payer: Medicare Other | Admitting: Gastroenterology

## 2010-07-12 VITALS — BP 190/80 | HR 56 | Ht 68.0 in | Wt 184.8 lb

## 2010-07-12 DIAGNOSIS — Z860100 Personal history of colon polyps, unspecified: Secondary | ICD-10-CM

## 2010-07-12 DIAGNOSIS — K5900 Constipation, unspecified: Secondary | ICD-10-CM

## 2010-07-12 NOTE — Patient Instructions (Addendum)
High fiber diet information given to patient. Constipation information given to patient. UJ:[id number] [name], MD

## 2010-07-12 NOTE — Progress Notes (Signed)
History of Present Illness: This is a 70 year old male who complains of alternating diarrhea and constipation associated with bloating. He complains of difficulty evacuating stool and difficulty cleaning his anal and perianal area due to anal folds. He has a history of recurrent diverticulitis but has experienced no problems with diverticulitis in the past few years. He has a long history of alternating diarrhea and constipation associated with abdominal bloating. His last colonoscopy was performed in February 2009 showing diverticulosis from the transverse to sigmoid colon, a 1 cm adenomatous colon polyp and moderate sized internal hemorrhoids. He relates small pellet-like stools, incomplete evacuation and occasional loose stools. He does not drink adequate amounts of water. He takes a fiber supplement regularly. He denies a change in his chronic bowel pattern melena, hematochezia, weight loss, nausea, vomiting, dysphagia.   Past Medical History  Diagnosis Date  . Diverticulitis, colon   . Diverticulosis of colon   . Gout   . Hyperlipidemia   . Hypertension     Dr. Melburn Popper  . LBP (low back pain)   . OA (osteoarthritis)   . BPH (benign prostatic hyperplasia)   . IBS (irritable bowel syndrome)     Dr. Russella Dar  C/D  . Hemorrhoids   . Adenomatous polyp 04/2007   Past Surgical History  Procedure Date  . Cardiac catheterization   . Skin cancer excision 12-21-2006    Nose    reports that he quit smoking about 22 years ago. He has never used smokeless tobacco. He reports that he does not drink alcohol or use illicit drugs. family history includes Heart failure in his father and mother and Hypertension in his father and mother.  There is no history of Colon cancer. Allergies  Allergen Reactions  . Atorvastatin   . Bystolic (Nebivolol Hcl) Other (See Comments)    Bladder burning  . Clonidine Derivatives Other (See Comments)    weak  . Furosemide   . Gemfibrozil   . Metoprolol Tartrate    REACTION: urinating too much  . Penicillins   . Sulfonamide Derivatives   . Tekturna (Aliskiren Fumarate)   . Terazosin Hcl     REACTION: ringing in the ears  . Verapamil     REACTION: Wt gain   Outpatient Encounter Prescriptions as of 07/12/2010  Medication Sig Dispense Refill  . AMBULATORY NON FORMULARY MEDICATION Take 2 tablets by mouth daily. Medication Name: Blood Pressure Factors       . Calcium Carbonate-Vit D-Min (CALCIUM 1200 PO) Take 1 each by mouth daily.        . Coenzyme Q10 60 MG TABS Take 1 tablet by mouth daily.        . indomethacin (INDOCIN) 50 MG capsule Take 25-50 mg by mouth as needed.       Marland Kitchen lisinopril (PRINIVIL,ZESTRIL) 20 MG tablet Take 2 tablets (40 mg total) by mouth daily.  180 tablet  3  . methylcellulose (CITRUCEL) oral powder Take 1 packet by mouth at bedtime.        . Multiple Vitamin (MULTIVITAMIN) tablet Take 1 tablet by mouth daily.        . Omega-3 Fatty Acids (FISH OIL) 1200 MG CAPS Take 1 capsule by mouth daily.        Marland Kitchen DISCONTD: oxymetazoline (AFRIN) 0.05 % nasal spray 1 spray by Nasal route 2 (two) times daily.        . traMADol (ULTRAM) 50 MG tablet Take 50-100 mg by mouth 2 (two) times daily as needed.        Marland Kitchen  DISCONTD: fish oil-omega-3 fatty acids 1000 MG capsule Take by mouth daily.        Marland Kitchen DISCONTD: triamcinolone (KENALOG) 0.5 % cream Apply 1 application topically 2 (two) times daily.         Review of Systems:  Allergies, arthritis, back pain, vision changes, fatigue, hearing problems. Pertinent positive and negative review of systems were noted in the above HPI section. All other review of systems were otherwise negative.  Physical Exam: General: Well developed , well nourished, no acute distress Head: Normocephalic and atraumatic Eyes:  sclerae anicteric, EOMI Ears: Normal auditory acuity Mouth: No deformity or lesions Neck: Supple, no masses or thyromegaly Lungs: Clear throughout to auscultation Heart: Regular rate and rhythm; no  murmurs, rubs or bruits Abdomen: Soft, non tender and non distended. No masses, hepatosplenomegaly or hernias noted. Normal Bowel sounds Rectal: Small external hemorrhoids, no internal lesions, Hemoccult negative hard, brown pellet-like stool in vault  Musculoskeletal: Symmetrical with no gross deformities  Skin: No lesions on visible extremities Pulses:  Normal pulses noted Extremities: No clubbing, cyanosis, edema or deformities noted Neurological: Alert oriented x 4, grossly nonfocal Cervical Nodes:  No significant cervical adenopathy Inguinal Nodes: No significant inguinal adenopathy Psychological:  Alert and cooperative. Normal mood and affect  Assessment and Recommendations:  1. IBS with predominantly constipation. This is characterized by small, pellet like stools, incomplete evacuation and abdominal bloating. Occasional episodes of diarrhea usually follow straining with constipated bowel movements. All of these problems may not be able to be fully corrected however increasing his dietary fiber and substantially increasing his water intake will likely help. If the above measures are not effective consider a trial of probiotics. His difficulties with easily cleaning his anal area are related to constipation, external and internal hemorrhoids. This situation may be helped by better management of his constipation.  2. Diverticulosis. Prior history of diverticulitis but none in the past few years. Management as per problem #1.  3. Personal history of adenomatous colon polyps. Surveillance colonoscopy recommended February 2014.

## 2010-07-18 NOTE — Assessment & Plan Note (Signed)
[facility]                         GASTROENTEROLOGY OFFICE NOTE   FRENCHIE, Mario Berg                       MRN:          [id number]  DATE:04/07/2007                            DOB:          [birth year]    Mr. Sermeno has ongoing problems with alternating constipation and  diarrhea, but his symptoms are under much better control since taking  Citrucel and eating yogurt on a regular basis.  He had not been able to  keep prior appointments for colonoscopy, as his driver cancelled on both  occasions.  He would like to reschedule his colonoscopy.   CURRENT MEDICATIONS:  Listed on the chart, updated and reviewed.   MEDICATION ALLERGIES:  PENICILLIN, SULFA, LIPITOR, LOPID and LASIX.   PHYSICAL EXAMINATION:  In no acute distress.  Weight 180 pounds.  Blood pressure is 148/66, pulse 60 and regular.  CHEST:  Clear to auscultation bilaterally.  CARDIAC:  Regular rate and rhythm without murmurs.  ABDOMEN:  Soft and nontender with normoactive bowel sounds.   ASSESSMENT AND PLAN:  History of diverticulosis with recurrent  diverticulitis.  Alternating diarrhea and constipation.  These symptoms  have improved with the regular use of a fiber supplement and yogurt.  Rule out colorectal neoplasms, inflammatory bowel disease and other  disorders.  Risks, benefits and alternatives to colonoscopy with  possible biopsy and possible polypectomy were discussed with the patient  and he consents to proceed; this will be scheduled electively.     Venita Lick. Russella Dar, MD, [facility]  Electronically Signed    MTS/MedQ  DD: 04/07/2007  DT: 04/08/2007  Job #: [id number]   cc:   Georgina Quint. [name], MD

## 2010-07-18 NOTE — Assessment & Plan Note (Signed)
[facility]                         GASTROENTEROLOGY OFFICE NOTE   Mario Berg, Mario Berg                       MRN:          [id number]  DATE:12/18/2006                            DOB:          [birth year]    REASON FOR CONSULTATION:  Recurrent diverticulitis and episodic  diarrhea.   HISTORY:  Mario Berg is a 70 year old white male who I saw previously in  April, 1999.  Please refer to that note, included on the chart.  He had  a change in bowel habits, associated with fecal urgency and occasional  difficulty evacuating stool.  He related intermittent lower abdominal  discomfort.  He had a history of lactose intolerance.  I advised  proceeding with a colonoscopy, and he did not proceed.  I have not seen  him back since that time.  He relates off and on difficulties with lower  abdominal pain associated with alternating diarrhea and constipation.  Over the past two years, he has had fairly severe episodic diarrhea with  occasional severe fecal urgency.  He has had 1-2 episodes of  incontinence.  He has been treated for diverticulitis about 8-10 times  over the past 12 years, by his report.  He generally sees Dr. Lesle Chris for his diverticulitis problems, as they have tended to occur on  the weekends and evenings.  He underwent a CT scan of the abdomen and  pelvis on December 06, 2004 with findings showing changes compatible with  acute sigmoid colon diverticulitis and an enlarged prostate, measuring  4.9 cm.  In addition, diverticulosis was noted in the descending colon  and sigmoid colon.  He relates no melena, hematochezia, change in bowel  habits, change in stool caliber, or weight loss.  He states that his  mother was diagnosed with colon polyps at about age 2.  No other family  members with colon polyps.  No family members with colon cancer or  inflammatory bowel disease.   PAST MEDICAL HISTORY:  1. Diverticulosis.  2. Diverticulitis.  3.  Hypertension.  4. Hyperlipidemia.  5. Hypogonadism.  6. Gout.  7. Enlarged prostate.  8. Elevated PSA.  9. Low back pain.  10.Osteoarthritis.  11.Skin cancer.  12.Allergic rhinitis.   CURRENT MEDICATIONS:  Current medications listed on the chart, updated,  and reviewed.   MEDICATION ALLERGIES:  PENICILLIN, SULFA DRUGS, LIPITOR, LOPID.   SOCIAL HISTORY:  Per the handwritten form.   REVIEW OF SYSTEMS:  Per the handwritten form.   PHYSICAL EXAMINATION:  A well-developed, well-nourished, anxious male.  Height 5 feet 8 inches.  Weight 177 pounds.  Blood pressure is 162/62.  Pulse is 60 with occasional irregular beat.  HEENT:  Anicteric sclerae.  Oropharynx clear.  CHEST:  Clear to auscultation bilaterally.  CARDIAC:  Regular rate and rhythm without murmurs appreciated.  ABDOMEN:  Soft, nontender, nondistended.  Normoactive bowel sounds.  No  palpable organomegaly, masses, or hernias.  RECTAL:  Deferred to the time of colonoscopy.  An examination performed  by Dr. Posey Rea in September showed no external or internal  abnormalities.  Normal sphincter tone.  A minimally enlarged  prostate.  EXTREMITIES:  Without clubbing, cyanosis or edema.  NEUROLOGIC:  Anxious, alert and oriented x3.  Grossly nonfocal.   ASSESSMENT/PLAN:  1. Diverticulosis with recurrent diverticulitis.  Long term high fiber      diet with increase daily water intake.  2. Alternating diarrhea and constipation associated with lower      abdominal discomfort and urgency.  His symptoms are certainly      compatible with irritable bowel syndrome.  Need to exclude      colorectal neoplasms, inflammatory bowel disease, and other      disorders.  Risks, benefits and alternatives to colonoscopy with      possible biopsy and possible polypectomy discussed with the      patient, and he consents to proceed.  This will be scheduled      electively.  Pending findings at colonoscopy, we will likely begin      an  antispasmodic for long term, as needed usage.     Venita Lick. Russella Dar, MD, [facility]  Electronically Signed    MTS/MedQ  DD: 12/18/2006  DT: 12/18/2006  Job #: [id number]   cc:   Georgina Quint. [name], MD

## 2010-12-29 ENCOUNTER — Ambulatory Visit (INDEPENDENT_AMBULATORY_CARE_PROVIDER_SITE_OTHER): Payer: Medicare Other | Admitting: Internal Medicine

## 2010-12-29 ENCOUNTER — Encounter: Payer: Self-pay | Admitting: Internal Medicine

## 2010-12-29 DIAGNOSIS — E785 Hyperlipidemia, unspecified: Secondary | ICD-10-CM

## 2010-12-29 DIAGNOSIS — R972 Elevated prostate specific antigen [PSA]: Secondary | ICD-10-CM

## 2010-12-29 DIAGNOSIS — I10 Essential (primary) hypertension: Secondary | ICD-10-CM

## 2010-12-29 DIAGNOSIS — E291 Testicular hypofunction: Secondary | ICD-10-CM

## 2010-12-29 NOTE — Progress Notes (Signed)
  Subjective:    Patient ID: Mario Berg, male    DOB: 02-27-1941, 70 y.o.   MRN: [id number]  HPI  The patient presents for a follow-up of  chronic hypertension, chronic dyslipidemia, OA controlled with medicines sub optimally    Review of Systems  Constitutional: Negative for appetite change, fatigue and unexpected weight change.  HENT: Negative for nosebleeds, congestion, sore throat, sneezing, trouble swallowing and neck pain.   Eyes: Negative for itching and visual disturbance.  Respiratory: Negative for cough.   Cardiovascular: Negative for chest pain, palpitations and leg swelling.  Gastrointestinal: Negative for nausea, diarrhea, blood in stool and abdominal distention.  Genitourinary: Negative for frequency and hematuria.  Musculoskeletal: Negative for back pain, joint swelling and gait problem.  Skin: Negative for rash.  Neurological: Negative for dizziness, tremors, speech difficulty and weakness.  Psychiatric/Behavioral: Negative for sleep disturbance, dysphoric mood and agitation. The patient is not nervous/anxious.        Objective:   Physical Exam  Constitutional: He is oriented to person, place, and time. He appears well-developed.  HENT:  Mouth/Throat: Oropharynx is clear and moist.  Eyes: Conjunctivae are normal. Pupils are equal, round, and reactive to light.  Neck: Normal range of motion. No JVD present. No thyromegaly present.  Cardiovascular: Normal rate, regular rhythm, normal heart sounds and intact distal pulses.  Exam reveals no gallop and no friction rub.   No murmur heard. Pulmonary/Chest: Effort normal and breath sounds normal. No respiratory distress. He has no wheezes. He has no rales. He exhibits no tenderness.  Abdominal: Soft. Bowel sounds are normal. He exhibits no distension and no mass. There is no tenderness. There is no rebound and no guarding.  Musculoskeletal: Normal range of motion. He exhibits no edema and no tenderness.  Lymphadenopathy:     He has no cervical adenopathy.  Neurological: He is alert and oriented to person, place, and time. He has normal reflexes. No cranial nerve deficit. He exhibits normal muscle tone. Coordination normal.  Skin: Skin is warm and dry. No rash noted.  Psychiatric: He has a normal mood and affect. His behavior is normal. Judgment and thought content normal.          Assessment & Plan:

## 2011-01-01 ENCOUNTER — Encounter: Payer: Self-pay | Admitting: Internal Medicine

## 2011-01-01 NOTE — Assessment & Plan Note (Signed)
Chronic. Multiple drugs intolerance. The best we can do. Continue with current prescription therapy as reflected on the Med list.  

## 2011-01-01 NOTE — Assessment & Plan Note (Signed)
F/u w/Urol. Is pending

## 2011-01-01 NOTE — Assessment & Plan Note (Signed)
Per u rology 

## 2011-01-01 NOTE — Assessment & Plan Note (Signed)
Declined statins. On Fish oil and diet

## 2011-03-23 ENCOUNTER — Encounter: Payer: Self-pay | Admitting: Internal Medicine

## 2011-03-23 ENCOUNTER — Ambulatory Visit (INDEPENDENT_AMBULATORY_CARE_PROVIDER_SITE_OTHER): Payer: Medicare Other | Admitting: Internal Medicine

## 2011-03-23 ENCOUNTER — Ambulatory Visit (INDEPENDENT_AMBULATORY_CARE_PROVIDER_SITE_OTHER)
Admission: RE | Admit: 2011-03-23 | Discharge: 2011-03-23 | Disposition: A | Discharge status: Home / Self Care | Payer: Medicare Other | Source: Ambulatory Visit | Attending: Internal Medicine | Admitting: Internal Medicine

## 2011-03-23 ENCOUNTER — Telehealth: Payer: Self-pay | Admitting: Internal Medicine

## 2011-03-23 VITALS — BP 178/94 | HR 76 | Temp 98.5°F | Wt 185.0 lb

## 2011-03-23 DIAGNOSIS — K648 Other hemorrhoids: Secondary | ICD-10-CM

## 2011-03-23 DIAGNOSIS — M25519 Pain in unspecified shoulder: Secondary | ICD-10-CM

## 2011-03-23 DIAGNOSIS — I10 Essential (primary) hypertension: Secondary | ICD-10-CM | POA: Diagnosis not present

## 2011-03-23 DIAGNOSIS — K649 Unspecified hemorrhoids: Secondary | ICD-10-CM

## 2011-03-23 MED ORDER — DICLOFENAC SODIUM 1.5 % TD SOLN: 5.0000 [drp] | Freq: Four times a day (QID) | TRANSDERMAL | Status: DC | PRN

## 2011-03-23 NOTE — Assessment & Plan Note (Signed)
Surg consult Discussed 1/13 worse

## 2011-03-23 NOTE — Patient Instructions (Addendum)
Stretch Contour pillow, heat Take your Indomethacin x 1-2 wks Call if rash

## 2011-03-23 NOTE — Assessment & Plan Note (Signed)
Chronic. Multiple drugs intolerance. The best we can do. Continue with current prescription therapy as reflected on the Med list.  

## 2011-03-23 NOTE — Progress Notes (Signed)
  Subjective:    Patient ID: Mario Berg, male    DOB: 03-16-1940, 71 y.o.   MRN: [id number]  HPI  C/o R shoulder pain x 1 wk 5-7/10, worse at night; no irrad. No injury C/o bad hemorrhoid x months, wants to have it removed  Review of Systems  Constitutional: Negative for fever and chills.  HENT: Negative for neck pain and neck stiffness.   Respiratory: Negative for cough and chest tightness.   Musculoskeletal: Negative for myalgias, back pain and arthralgias.  Skin: Negative for rash.       Objective:   Physical Exam  Constitutional: He is oriented to person, place, and time. He appears well-developed. No distress.  HENT:  Mouth/Throat: Oropharynx is clear and moist.  Eyes: Conjunctivae are normal. Pupils are equal, round, and reactive to light.  Neck: Normal range of motion. No JVD present. No thyromegaly present.  Cardiovascular: Normal rate, regular rhythm, normal heart sounds and intact distal pulses.  Exam reveals no gallop and no friction rub.   No murmur heard. Pulmonary/Chest: Effort normal and breath sounds normal. No respiratory distress. He has no wheezes. He has no rales. He exhibits no tenderness.  Abdominal: Soft. Bowel sounds are normal. He exhibits no distension and no mass. There is no tenderness. There is no rebound and no guarding.  Genitourinary:       NE  Musculoskeletal: Normal range of motion. He exhibits tenderness (R post shoulder is tender to palp in the mid-teres muscles; ROM is ok). He exhibits no edema.  Lymphadenopathy:    He has no cervical adenopathy.  Neurological: He is alert and oriented to person, place, and time. He has normal reflexes. No cranial nerve deficit. He exhibits normal muscle tone. Coordination normal.  Skin: Skin is warm and dry. No rash noted. He is not diaphoretic.  Psychiatric: He has a normal mood and affect. His behavior is normal. Judgment and thought content normal.          Assessment & Plan:

## 2011-03-23 NOTE — Telephone Encounter (Signed)
  I did let him know - Xray was ok. Watch for rash If rash - call us asap

## 2011-03-23 NOTE — Assessment & Plan Note (Addendum)
Stretch Pennsaid tid Indomethacin x 1-2 wkx\s Xray Heat, massage Call if rash

## 2011-04-09 ENCOUNTER — Encounter (INDEPENDENT_AMBULATORY_CARE_PROVIDER_SITE_OTHER): Payer: Self-pay | Admitting: Surgery

## 2011-04-09 ENCOUNTER — Ambulatory Visit (INDEPENDENT_AMBULATORY_CARE_PROVIDER_SITE_OTHER): Payer: Medicare Other | Admitting: Surgery

## 2011-04-09 VITALS — BP 166/82 | HR 72 | Temp 98.1°F | Resp 18 | Ht 68.0 in | Wt 186.0 lb

## 2011-04-09 DIAGNOSIS — K648 Other hemorrhoids: Secondary | ICD-10-CM

## 2011-04-09 DIAGNOSIS — K429 Umbilical hernia without obstruction or gangrene: Secondary | ICD-10-CM | POA: Diagnosis not present

## 2011-04-09 DIAGNOSIS — K589 Irritable bowel syndrome without diarrhea: Secondary | ICD-10-CM | POA: Diagnosis not present

## 2011-04-09 NOTE — Progress Notes (Signed)
Subjective:     Patient ID: Mario Berg, male   DOB: [birth year], 71 y.o.   MRN: [id number]  HPI  Mario Berg  03/29/1940 [id number]  Patient Care Team: Sonda Primes, MD as PCP - General Eliezer Bottom., MD,FACG as Consulting Physician (Gastroenterology)  This patient is a 71 y.o.male who presents today for surgical evaluation at the request of Dr. Posey Rea.   Reason for visit: Hemorrhoids with prolapse and bleeding.  The patient is an active male who is struggling with hemorrhoids for decades. They use to happen with bouts of constipation and diarrhea. With the help of his primary care physician and Dr. Russella Dar, he increased water intake and a fiber regimen. Now his bowels are more regular.  Bowel movements about every one to 2 days now. No explosive diarrhea.   Over these past few years he is still getting worsening bleeding and prolapse of his hemorrhoids. It is progressed to the point where it happens every day now. He often has to push them back in. He has read a lot of this on the internet. He was thinking perhaps a PPH stapling would be the solution. He has never had any injections or bandings et Karie Soda. He had a colonoscopy in 2009 that showed diverticulosis. He is due for followup next year. He used to exercise weekly. However with the increasing anorectal pain, he cannot tolerate sitting long periods of time & stoppped kayaking, etc.  He is frustrated & wants something done.  Patient Active Problem List  Diagnoses  . COLONIC POLYPS  . NEOP, UB, SKIN  . HYPOGONADISM, MALE  . HYPERLIPIDEMIA  . GOUT  . INSOMNIA, TRANSIENT  . HYPERTENSION  . UPPER RESPIRATORY INFECTION (URI)  . DIVERTICULOSIS, COLON  . IBS  . BPH w/o Urinary Obs/LUTS  . OSTEOARTHRITIS, GENERALIZED  . OSTEOARTHRITIS  . ARTHRITIS  . LOW BACK PAIN  . FOOT PAIN  . RASH AND OTHER NONSPECIFIC SKIN ERUPTION  . FREQUENCY, URINARY  . PSA, INCREASED  . SKIN CANCER, HX OF  . TOBACCO USE, QUIT  . CERUMEN  IMPACTION  . Shoulder pain  . Internal hemorrhoids with prolapse & bleeding    Past Medical History  Diagnosis Date  . Diverticulitis, colon   . Diverticulosis of colon   . Gout   . Hyperlipidemia   . Hypertension     Dr. Melburn Popper  . LBP (low back pain)   . OA (osteoarthritis)   . BPH (benign prostatic hyperplasia)   . IBS (irritable bowel syndrome)     Dr. Russella Dar  C/D  . Hemorrhoids   . Adenomatous polyp 04/2007  . Nasal congestion   . Rectal bleeding   . DIVERTICULITIS OF COLON 05/06/2007    Past Surgical History  Procedure Date  . Cardiac catheterization   . Skin cancer excision 12-21-2006    Nose    History   Social History  . Marital Status: Single    Spouse Name: N/A    Number of Children: N/A  . Years of Education: N/A   Occupational History  . retired     Engineer, civil (consulting)   Social History Main Topics  . Smoking status: Former Smoker    Quit date: 06/03/1988  . Smokeless tobacco: Never Used  . Alcohol Use: No  . Drug Use: No  . Sexually Active: Not on file   Other Topics Concern  . Not on file   Social History Narrative  . No narrative on file  Family History  Problem Relation Age of Onset  . Heart failure Mother   . Hypertension Mother   . Hypertension Father   . Heart failure Father   . Colon cancer Neg Hx     Current outpatient prescriptions:AMBULATORY NON FORMULARY MEDICATION, Take 2 tablets by mouth daily. Medication Name: Blood Pressure Factors , Disp: , Rfl: ;  Calcium Carbonate-Vit D-Min (CALCIUM 1200 PO), Take 1 each by mouth daily.  , Disp: , Rfl: ;  Coenzyme Q10 60 MG TABS, Take 1 tablet by mouth daily.  , Disp: , Rfl: ;  Diclofenac Sodium (PENNSAID) 1.5 % SOLN, Apply 0.3 mLs topically 4 (four) times daily as needed., Disp: 150 mL, Rfl: 1 indomethacin (INDOCIN) 50 MG capsule, Take 25-50 mg by mouth as needed. , Disp: , Rfl: ;  lisinopril (PRINIVIL,ZESTRIL) 20 MG tablet, Take 2 tablets (40 mg total) by mouth daily., Disp: 180 tablet, Rfl:  3;  methylcellulose (CITRUCEL) oral powder, Take 1 packet by mouth at bedtime.  , Disp: , Rfl: ;  Multiple Vitamin (MULTIVITAMIN) tablet, Take 1 tablet by mouth daily.  , Disp: , Rfl:  Omega-3 Fatty Acids (FISH OIL) 1200 MG CAPS, Take 1 capsule by mouth daily.  , Disp: , Rfl: ;  traMADol (ULTRAM) 50 MG tablet, Take 50-100 mg by mouth 2 (two) times daily as needed.  , Disp: , Rfl:   Allergies  Allergen Reactions  . Atorvastatin Other (See Comments)    Diverticulosis per patient  . Furosemide Other (See Comments)    Diverticulosis per patient  . Bystolic (Nebivolol Hcl) Other (See Comments)    Bladder burning  . Clonidine Derivatives Other (See Comments)    weak  . Gemfibrozil   . Metoprolol Tartrate     REACTION: urinating too much  . Penicillins   . Sulfonamide Derivatives   . Tekturna (Aliskiren Fumarate)   . Terazosin Hcl     REACTION: ringing in the ears  . Verapamil     REACTION: Wt gain    BP 166/82  Pulse 72  Temp(Src) 98.1 F (36.7 C) (Temporal)  Resp 18  Ht 5\' 8"  (1.727 m)  Wt 186 lb (84.369 kg)  BMI 28.28 kg/m2    Review of Systems  Constitutional: Negative for fever, chills and diaphoresis.  HENT: Positive for congestion. Negative for nosebleeds, sore throat, facial swelling, mouth sores, trouble swallowing and ear discharge.   Eyes: Negative for photophobia, discharge and visual disturbance.  Respiratory: Negative for choking, chest tightness, shortness of breath and stridor.   Cardiovascular: Negative for chest pain and palpitations.  Gastrointestinal: Positive for anal bleeding and rectal pain. Negative for nausea, vomiting, abdominal pain, blood in stool and abdominal distention.  Genitourinary: Negative for dysuria, urgency, difficulty urinating and testicular pain.  Musculoskeletal: Negative for myalgias, back pain, arthralgias and gait problem.  Skin: Negative for color change, pallor, rash and wound.  Neurological: Negative for dizziness, speech  difficulty, weakness, numbness and headaches.  Hematological: Negative for adenopathy. Does not bruise/bleed easily.  Psychiatric/Behavioral: Negative for hallucinations, confusion and agitation.       Objective:   Physical Exam  Constitutional: He is oriented to person, place, and time. He appears well-developed and well-nourished. No distress.  HENT:  Head: Normocephalic.  Mouth/Throat: Oropharynx is clear and moist. No oropharyngeal exudate.  Eyes: Conjunctivae and EOM are normal. Pupils are equal, round, and reactive to light. No scleral icterus.  Neck: Normal range of motion. Neck supple. No tracheal deviation present.  Cardiovascular: Normal  rate, regular rhythm and intact distal pulses.   Pulmonary/Chest: Effort normal and breath sounds normal. No respiratory distress.  Abdominal: Soft. He exhibits no distension. There is no tenderness. There is no rebound and no guarding. Hernia confirmed negative in the right inguinal area and confirmed negative in the left inguinal area.       1cm umb hernia soft, reducible  Genitourinary: Penis normal. No penile tenderness.       Perianal skin clean with good hygiene.  No pruritis.  No pilonidal disease.  No fissure.  No abscess/fistula.    Tolerates digital and anoscopic rectal exam.  Normal sphincter tone.  No rectal masses.    Hemorrhoids L lat & R post (2/3 piles) quite engorged & easily prolapsing.  R ant hem moderately enlarged.   Musculoskeletal: Normal range of motion. He exhibits no tenderness.  Lymphadenopathy:    He has no cervical adenopathy.       Right: No inguinal adenopathy present.       Left: No inguinal adenopathy present.  Neurological: He is alert and oriented to person, place, and time. No cranial nerve deficit. He exhibits normal muscle tone. Coordination normal.  Skin: Skin is warm and dry. No rash noted. He is not diaphoretic. No erythema. No pallor.  Psychiatric: He has a normal mood and affect. His behavior is  normal. Judgment and thought content normal.       Assessment:     Hemorrhoids with daily bleeding & prolapse    Plan:     Given that he is having daily symptoms despite a good bowel regimen, I think he requires surgery. Because of 2 of the piles are prolapsing, I think he would be a good THD candidate. He would like to think about things first. I did offer to inject the piles a few times before proceeding to surgery, however, with daily symptoms I am skeptical that injections only will make them resolve. They are too low and sensitive to tolerate banding.  If he prefers PPH, I will send him to one of my partners whom does that more regularly.  The anatomy & physiology of the anorectal region was discussed.  The pathophysiology of hemorrhoids and differential diagnosis was discussed.  Natural history risks without surgery was discussed.   I stressed the importance of a bowel regimen to have daily soft bowel movements to minimize progression of disease.  Interventions such as sclerotherapy & banding were discussed.  The patient's symptoms are not adequately controlled by medicines and other non-operative treatments.  I feel the risks & problems of no surgery outweigh the operative risks; therefore, I recommended surgery to treat the hemorrhoids by ligation, pexy, and possible resection.  Risks such as bleeding, infection, need for further treatment, heart attack, death, and other risks were discussed.   I noted a good likelihood this will help address the problem.  Goals of post-operative recovery were discussed as well.  Possibility that this will not correct all symptoms was explained.  Post-operative pain, bleeding, constipation, and other problems after surgery were discussed.  We will work to minimize complications.   Educational handouts further explaining the pathology, treatment options, and bowel regimen were given as well.  Questions were answered.  The patient expresses understanding &  wishes to proceed with surgery.

## 2011-04-09 NOTE — Patient Instructions (Signed)

## 2011-04-17 ENCOUNTER — Encounter (HOSPITAL_COMMUNITY): Payer: Self-pay | Admitting: Pharmacy Technician

## 2011-04-18 ENCOUNTER — Other Ambulatory Visit (INDEPENDENT_AMBULATORY_CARE_PROVIDER_SITE_OTHER): Payer: Self-pay | Admitting: Surgery

## 2011-04-19 ENCOUNTER — Other Ambulatory Visit: Payer: Self-pay

## 2011-04-19 ENCOUNTER — Encounter (HOSPITAL_COMMUNITY): Payer: Self-pay

## 2011-04-19 ENCOUNTER — Encounter (HOSPITAL_COMMUNITY)
Admission: RE | Admit: 2011-04-19 | Discharge: 2011-04-19 | Disposition: A | Discharge status: Home / Self Care | Payer: Medicare Other | Source: Ambulatory Visit | Attending: Surgery | Admitting: Surgery

## 2011-04-19 ENCOUNTER — Ambulatory Visit (HOSPITAL_COMMUNITY)
Admission: RE | Admit: 2011-04-19 | Discharge: 2011-04-19 | Disposition: A | Discharge status: Home / Self Care | Payer: Medicare Other | Source: Ambulatory Visit | Attending: Surgery | Admitting: Surgery

## 2011-04-19 DIAGNOSIS — Z01818 Encounter for other preprocedural examination: Secondary | ICD-10-CM | POA: Insufficient documentation

## 2011-04-19 DIAGNOSIS — R0602 Shortness of breath: Secondary | ICD-10-CM | POA: Insufficient documentation

## 2011-04-19 DIAGNOSIS — I517 Cardiomegaly: Secondary | ICD-10-CM | POA: Diagnosis not present

## 2011-04-19 DIAGNOSIS — Z01811 Encounter for preprocedural respiratory examination: Secondary | ICD-10-CM | POA: Diagnosis not present

## 2011-04-19 DIAGNOSIS — Z01812 Encounter for preprocedural laboratory examination: Secondary | ICD-10-CM | POA: Insufficient documentation

## 2011-04-19 DIAGNOSIS — Z01810 Encounter for preprocedural cardiovascular examination: Secondary | ICD-10-CM | POA: Diagnosis not present

## 2011-04-19 DIAGNOSIS — K649 Unspecified hemorrhoids: Secondary | ICD-10-CM | POA: Diagnosis not present

## 2011-04-19 DIAGNOSIS — R091 Pleurisy: Secondary | ICD-10-CM | POA: Diagnosis not present

## 2011-04-19 HISTORY — DX: Umbilical hernia without obstruction or gangrene: K42.9

## 2011-04-19 HISTORY — DX: Shortness of breath: R06.02

## 2011-04-19 LAB — BASIC METABOLIC PANEL
BUN: 13 mg/dL (ref 6–23)
Creatinine, Ser: 0.82 mg/dL (ref 0.50–1.35)
GFR calc Af Amer: >90 mL/min (ref >90)
GFR calc non Af Amer: 88 mL/min — ABNORMAL LOW (ref >90)
Glucose, Bld: 114 mg/dL — ABNORMAL HIGH (ref 70–99)

## 2011-04-19 LAB — CBC
HCT: 46.3 % (ref 39.0–52.0)
MCHC: 34.6 g/dL (ref 30.0–36.0)
MCV: 90.4 fL (ref 78.0–100.0)
RDW: 13.1 % (ref 11.5–15.5)

## 2011-04-19 NOTE — Patient Instructions (Signed)
20 Mario Berg  04/19/2011   Your procedure is scheduled on Thursday 2/21  AT 11:40 AM  Report to [facility] at  9:30 AM.  Call this number if you have problems the morning of surgery: [phone]   Remember:FLEETS ENEMA THE AM OF YOUR SURGERY BEFORE YOU COME TO THE HOSPITAL   Do not eat food OR DRINK ANYTHING AFTER MIDNIGHT THE NIGHT BEFORE YOUR SURGERY.    Take these medicines the morning of surgery with A SIP OF WATER: INDOMETHACIN IF NEEDED,  MAY USE EYE WASH AND USE YOUR AFRIN   Do not wear jewelry  Do not wear lotions  .  Do not bring valuables to the hospital.  Contacts, dentures or bridgework may not be worn into surgery.  Leave suitcase in the car. After surgery it may be brought to your room.  For patients admitted to the hospital, checkout time is 11:00 AM the day of discharge.   Patients discharged the day of surgery will not be allowed to drive home.    Special Instructions: CHG Shower Use Special Wash: 1/2 bottle night before surgery and 1/2 bottle morning of surgery.   Please read over the following fact sheets that you were given: MRSA Information

## 2011-04-19 NOTE — Pre-Procedure Instructions (Signed)
EKG AND CXR AND CBC AND BMET WERE DONE TODAY IN PREOP AT [facility] PER ANESTHESIOLOGIST'S GUIDELINES

## 2011-05-17 ENCOUNTER — Encounter (HOSPITAL_COMMUNITY): Payer: Self-pay | Admitting: Anesthesiology

## 2011-05-17 ENCOUNTER — Ambulatory Visit (HOSPITAL_COMMUNITY): Payer: Medicare Other | Admitting: Anesthesiology

## 2011-05-17 ENCOUNTER — Ambulatory Visit (HOSPITAL_COMMUNITY)
Admission: RE | Admit: 2011-05-17 | Discharge: 2011-05-17 | Disposition: A | Discharge status: Home / Self Care | Payer: Medicare Other | Source: Ambulatory Visit | Attending: Surgery | Admitting: Surgery

## 2011-05-17 ENCOUNTER — Encounter (HOSPITAL_COMMUNITY)
Admission: RE | Disposition: A | Discharge status: Home / Self Care | Payer: Self-pay | Source: Ambulatory Visit | Attending: Surgery

## 2011-05-17 ENCOUNTER — Encounter (HOSPITAL_COMMUNITY): Payer: Self-pay | Admitting: *Deleted

## 2011-05-17 DIAGNOSIS — E785 Hyperlipidemia, unspecified: Secondary | ICD-10-CM | POA: Insufficient documentation

## 2011-05-17 DIAGNOSIS — N400 Enlarged prostate without lower urinary tract symptoms: Secondary | ICD-10-CM | POA: Diagnosis not present

## 2011-05-17 DIAGNOSIS — K5732 Diverticulitis of large intestine without perforation or abscess without bleeding: Secondary | ICD-10-CM | POA: Diagnosis not present

## 2011-05-17 DIAGNOSIS — R0602 Shortness of breath: Secondary | ICD-10-CM | POA: Diagnosis not present

## 2011-05-17 DIAGNOSIS — K648 Other hemorrhoids: Secondary | ICD-10-CM | POA: Insufficient documentation

## 2011-05-17 DIAGNOSIS — I10 Essential (primary) hypertension: Secondary | ICD-10-CM | POA: Insufficient documentation

## 2011-05-17 DIAGNOSIS — K649 Unspecified hemorrhoids: Secondary | ICD-10-CM | POA: Diagnosis not present

## 2011-05-17 HISTORY — PX: HEMORRHOID SURGERY: SHX153

## 2011-05-17 SURGERY — TRANSANAL HEMORRHOIDAL DEARTERIALIZATION
Anesthesia: General | Site: Rectum | Wound class: Clean Contaminated

## 2011-05-17 MED ORDER — HYDROMORPHONE HCL PF 1 MG/ML IJ SOLN
INTRAMUSCULAR | Status: AC
  Filled 2011-05-17: qty 1

## 2011-05-17 MED ORDER — LACTATED RINGERS IV SOLN
INTRAVENOUS | Status: DC | PRN
  Administered 2011-05-17: 13:00:00 via INTRAVENOUS

## 2011-05-17 MED ORDER — GLYCOPYRROLATE 0.2 MG/ML IJ SOLN
INTRAMUSCULAR | Status: DC | PRN
  Administered 2011-05-17: .6 mg via INTRAVENOUS

## 2011-05-17 MED ORDER — FENTANYL CITRATE 0.05 MG/ML IJ SOLN
INTRAMUSCULAR | Status: DC | PRN
  Administered 2011-05-17 (×2): 50 ug via INTRAVENOUS
  Administered 2011-05-17: 100 ug via INTRAVENOUS

## 2011-05-17 MED ORDER — ONDANSETRON HCL 4 MG/2ML IJ SOLN: 4.0000 mg | Freq: Four times a day (QID) | INTRAMUSCULAR | Status: DC | PRN

## 2011-05-17 MED ORDER — SODIUM CHLORIDE 0.9 % IJ SOLN: 3.0000 mL | INTRAMUSCULAR | Status: DC | PRN

## 2011-05-17 MED ORDER — NEOSTIGMINE METHYLSULFATE 1 MG/ML IJ SOLN
INTRAMUSCULAR | Status: DC | PRN
  Administered 2011-05-17: 4 mg via INTRAVENOUS

## 2011-05-17 MED ORDER — LIDOCAINE HCL (CARDIAC) 20 MG/ML IV SOLN
INTRAVENOUS | Status: DC | PRN
  Administered 2011-05-17: 100 mg via INTRAVENOUS

## 2011-05-17 MED ORDER — PROPOFOL 10 MG/ML IV EMUL
INTRAVENOUS | Status: DC | PRN
  Administered 2011-05-17: 150 mg via INTRAVENOUS

## 2011-05-17 MED ORDER — CIPROFLOXACIN IN D5W 400 MG/200ML IV SOLN
INTRAVENOUS | Status: AC
  Filled 2011-05-17: qty 200

## 2011-05-17 MED ORDER — ACETAMINOPHEN 650 MG RE SUPP: 650.0000 mg | RECTAL | Status: DC | PRN

## 2011-05-17 MED ORDER — OXYCODONE HCL 5 MG PO TABS: 5.0000 mg | ORAL_TABLET | ORAL | Status: DC | PRN

## 2011-05-17 MED ORDER — SODIUM CHLORIDE 0.9 % IJ SOLN: 3.0000 mL | Freq: Two times a day (BID) | INTRAMUSCULAR | Status: DC

## 2011-05-17 MED ORDER — LACTATED RINGERS IV SOLN
INTRAVENOUS | Status: DC
  Administered 2011-05-17: 1000 mL via INTRAVENOUS

## 2011-05-17 MED ORDER — METRONIDAZOLE IN NACL 5-0.79 MG/ML-% IV SOLN
500.0000 mg | INTRAVENOUS | Status: AC
  Administered 2011-05-17: .5 g via INTRAVENOUS
  Filled 2011-05-17: qty 100

## 2011-05-17 MED ORDER — SODIUM CHLORIDE 0.9 % IV SOLN: 250.0000 mL | INTRAVENOUS | Status: DC | PRN

## 2011-05-17 MED ORDER — OXYCODONE HCL 5 MG PO TABS: 5.0000 mg | ORAL_TABLET | ORAL | Status: AC | PRN

## 2011-05-17 MED ORDER — MIDAZOLAM HCL 5 MG/5ML IJ SOLN
INTRAMUSCULAR | Status: DC | PRN
  Administered 2011-05-17: 2 mg via INTRAVENOUS

## 2011-05-17 MED ORDER — FLEET ENEMA 7-19 GM/118ML RE ENEM: 1.0000 | ENEMA | Freq: Once | RECTAL | Status: DC

## 2011-05-17 MED ORDER — FENTANYL CITRATE 0.05 MG/ML IJ SOLN: 25.0000 ug | INTRAMUSCULAR | Status: DC | PRN

## 2011-05-17 MED ORDER — ACETAMINOPHEN 325 MG PO TABS: 650.0000 mg | ORAL_TABLET | ORAL | Status: DC | PRN

## 2011-05-17 MED ORDER — ROCURONIUM BROMIDE 100 MG/10ML IV SOLN
INTRAVENOUS | Status: DC | PRN
  Administered 2011-05-17: 40 mg via INTRAVENOUS

## 2011-05-17 MED ORDER — BUPIVACAINE LIPOSOME 1.3 % IJ SUSP
20.0000 mL | Freq: Once | INTRAMUSCULAR | Status: AC
  Administered 2011-05-17: 20 mL
  Filled 2011-05-17: qty 20

## 2011-05-17 MED ORDER — HYDROMORPHONE HCL PF 1 MG/ML IJ SOLN
0.2500 mg | INTRAMUSCULAR | Status: DC | PRN
  Administered 2011-05-17 (×2): 0.5 mg via INTRAVENOUS

## 2011-05-17 MED ORDER — OXYCODONE HCL 5 MG PO TABS
ORAL_TABLET | ORAL | Status: AC
  Filled 2011-05-17: qty 1

## 2011-05-17 MED ORDER — PROMETHAZINE HCL 25 MG/ML IJ SOLN: 6.2500 mg | INTRAMUSCULAR | Status: DC | PRN

## 2011-05-17 MED ORDER — ONDANSETRON HCL 4 MG/2ML IJ SOLN
INTRAMUSCULAR | Status: DC | PRN
  Administered 2011-05-17: 4 mg via INTRAVENOUS

## 2011-05-17 MED ORDER — CIPROFLOXACIN IN D5W 400 MG/200ML IV SOLN
400.0000 mg | INTRAVENOUS | Status: AC
  Administered 2011-05-17: 400 mg via INTRAVENOUS

## 2011-05-17 SURGICAL SUPPLY — 48 items
BLADE EXTENDED COATED 6.5IN (ELECTRODE) IMPLANT
BLADE HEX COATED 2.75 (ELECTRODE) ×3 IMPLANT
BLADE SURG 15 STRL LF DISP TIS (BLADE) ×4 IMPLANT
BLADE SURG 15 STRL SS (BLADE) ×2
BRIEF STRETCH FOR OB PAD LRG (UNDERPADS AND DIAPERS) IMPLANT
CANISTER SUCTION 2500CC (MISCELLANEOUS) ×3 IMPLANT
CLOTH BEACON ORANGE TIMEOUT ST (SAFETY) ×3 IMPLANT
DECANTER SPIKE VIAL GLASS SM (MISCELLANEOUS) ×3 IMPLANT
DRAPE LAPAROTOMY T 102X78X121 (DRAPES) ×3 IMPLANT
DRSG PAD ABDOMINAL 8X10 ST (GAUZE/BANDAGES/DRESSINGS) IMPLANT
ELECT REM PT RETURN 9FT ADLT (ELECTROSURGICAL) ×3
ELECTRODE REM PT RTRN 9FT ADLT (ELECTROSURGICAL) ×2 IMPLANT
GAUZE SPONGE 4X4 16PLY XRAY LF (GAUZE/BANDAGES/DRESSINGS) ×3 IMPLANT
GLOVE BIOGEL PI IND STRL 7.0 (GLOVE) ×2 IMPLANT
GLOVE BIOGEL PI INDICATOR 7.0 (GLOVE) ×1
GLOVE ECLIPSE 8.0 STRL XLNG CF (GLOVE) ×3 IMPLANT
GLOVE INDICATOR 8.0 STRL GRN (GLOVE) ×6 IMPLANT
GOWN STRL NON-REIN LRG LVL3 (GOWN DISPOSABLE) ×3 IMPLANT
GOWN STRL REIN XL XLG (GOWN DISPOSABLE) ×6 IMPLANT
HEMOSTAT SURGICEL 4X8 (HEMOSTASIS) ×3 IMPLANT
KIT BASIN OR (CUSTOM PROCEDURE TRAY) ×3 IMPLANT
KIT SLIDE ONE PROLAPS HEMORR (KITS) ×3 IMPLANT
LEGGING LITHOTOMY PAIR STRL (DRAPES) IMPLANT
LUBRICANT JELLY K Y 4OZ (MISCELLANEOUS) ×3 IMPLANT
NDL SAFETY ECLIPSE 18X1.5 (NEEDLE) ×2 IMPLANT
NEEDLE HYPO 18GX1.5 SHARP (NEEDLE) ×1
NEEDLE HYPO 22GX1.5 SAFETY (NEEDLE) ×3 IMPLANT
NS IRRIG 1000ML POUR BTL (IV SOLUTION) ×3 IMPLANT
PACK BASIC VI WITH GOWN DISP (CUSTOM PROCEDURE TRAY) ×3 IMPLANT
PACK LITHOTOMY IV (CUSTOM PROCEDURE TRAY) IMPLANT
PENCIL BUTTON HOLSTER BLD 10FT (ELECTRODE) ×3 IMPLANT
SPONGE GAUZE 4X4 12PLY (GAUZE/BANDAGES/DRESSINGS) IMPLANT
SPONGE SURGIFOAM ABS GEL 100 (HEMOSTASIS) ×3 IMPLANT
SPONGE SURGIFOAM ABS GEL 12-7 (HEMOSTASIS) IMPLANT
STAPLER PROXIMATE HCS (STAPLE) IMPLANT
SUT CHROMIC 2 0 SH (SUTURE) IMPLANT
SUT CHROMIC 3 0 SH 27 (SUTURE) IMPLANT
SUT PROLENE 2 0 BLUE (SUTURE) ×3 IMPLANT
SUT VIC AB 2-0 SH 27 (SUTURE)
SUT VIC AB 2-0 SH 27X BRD (SUTURE) IMPLANT
SUT VIC AB 2-0 UR6 27 (SUTURE) IMPLANT
SUT VIC AB 3-0 SH 18 (SUTURE) IMPLANT
SUT VIC AB 3-0 SH 27 (SUTURE)
SUT VIC AB 3-0 SH 27XBRD (SUTURE) IMPLANT
SUT VIC AB 4-0 SH 18 (SUTURE) IMPLANT
SYR 20CC LL (SYRINGE) ×3 IMPLANT
TOWEL OR 17X26 10 PK STRL BLUE (TOWEL DISPOSABLE) ×3 IMPLANT
YANKAUER SUCT BULB TIP 10FT TU (MISCELLANEOUS) ×3 IMPLANT

## 2011-05-17 NOTE — H&P (Signed)
Mario Berg  [birth year] [id number]  Patient Care Team: Tresa Garter, MD as PCP - General Meryl Dare, MD,FACG as Consulting Physician (Gastroenterology)  This patient is a 71 y.o.male who presents today for surgical evaluation at the request of Dr. Posey Rea.  Reason for visit: Hemorrhoids with prolapse and bleeding.   The patient is an active male who is struggling with hemorrhoids for decades. They use to happen with bouts of constipation and diarrhea. With the help of his primary care physician and Dr. Russella Dar, he increased water intake and a fiber regimen. Now his bowels are more regular. Bowel movements about every one to 2 days now. No explosive diarrhea.   Over these past few years he is still getting worsening bleeding and prolapse of his hemorrhoids. It is progressed to the point where it happens every day now. He often has to push them back in. He has read a lot of this on the internet. He was thinking perhaps a PPH stapling would be the solution. He has never had any injections or bandings et Karie Soda. He had a colonoscopy in 2009 that showed diverticulosis. He is due for followup next year. He used to exercise weekly. However with the increasing anorectal pain, he cannot tolerate sitting long periods of time & stoppped kayaking, etc. He is frustrated & wants something done.   No new events.  Still with symptoms    Patient Active Problem List  Diagnoses  . COLONIC POLYPS  . NEOP, UB, SKIN  . HYPOGONADISM, MALE  . HYPERLIPIDEMIA  . GOUT  . INSOMNIA, TRANSIENT  . HYPERTENSION  . UPPER RESPIRATORY INFECTION (URI)  . DIVERTICULOSIS, COLON  . IBS  . BPH w/o Urinary Obs/LUTS  . OSTEOARTHRITIS, GENERALIZED  . OSTEOARTHRITIS  . ARTHRITIS  . LOW BACK PAIN  . FOOT PAIN  . RASH AND OTHER NONSPECIFIC SKIN ERUPTION  . FREQUENCY, URINARY  . PSA, INCREASED  . SKIN CANCER, HX OF  . TOBACCO USE, QUIT  . CERUMEN IMPACTION  . Shoulder pain  . Internal hemorrhoids with  prolapse & bleeding  . Umbilical hernia    Past Medical History  Diagnosis Date  . Diverticulitis, colon   . Diverticulosis of colon   . Gout   . Hyperlipidemia   . Hypertension     Dr. Melburn Popper  . LBP (low back pain)   . OA (osteoarthritis)   . BPH (benign prostatic hyperplasia)   . IBS (irritable bowel syndrome)     Dr. Russella Dar  C/D  . Hemorrhoids   . Adenomatous polyp 04/2007  . Nasal congestion   . Rectal bleeding   . DIVERTICULITIS OF COLON 05/06/2007  . Gout   . Shortness of breath     [place]  . Umbilical hernia     NO PAIN-BUT PT WORRIES ABOUT IT--WEARS A BACK BRACE FOR SUPPORT AT TIMES    Past Surgical History  Procedure Date  . Skin cancer excision 12-21-2006    Nose    History   Social History  . Marital Status: Single    Spouse Name: N/A    Number of Children: N/A  . Years of Education: N/A   Occupational History  . retired     Engineer, civil (consulting)   Social History Main Topics  . Smoking status: Former Smoker    Quit date: 06/03/1988  . Smokeless tobacco: Never Used  . Alcohol Use: No  . Drug Use: Yes    Special: Marijuana  MOST EVERY NIGHT FOR SLEEP  . Sexually Active: Not on file   Other Topics Concern  . Not on file   Social History Narrative  . No narrative on file    Family History  Problem Relation Age of Onset  . Heart failure Mother   . Hypertension Mother   . Hypertension Father   . Heart failure Father   . Colon cancer Neg Hx     Current facility-administered medications:ciprofloxacin (CIPRO) IVPB 400 mg, 400 mg, Intravenous, 120 min pre-op, Ardeth Sportsman, MD;  lactated ringers infusion, , Intravenous, Continuous, Eilene Ghazi, MD, Last Rate: 100 mL/hr at 05/17/11 1112, 1,000 mL at 05/17/11 1112;  metroNIDAZOLE (FLAGYL) IVPB 500 mg, 500 mg, Intravenous, On Call to OR, Ardeth Sportsman, MD sodium phosphate (FLEET) 7-19 GM/118ML enema 1 enema, 1 enema, Rectal, Once, Ardeth Sportsman, MD  Allergies  Allergen Reactions  .  Atorvastatin Other (See Comments)    Diverticulosis per patient  . Furosemide Other (See Comments)    Diverticulosis per patient  . Bystolic (Nebivolol Hcl) Other (See Comments)    Bladder burning  . Clams (Shellfish Allergy) Other (See Comments)    Poison the body   . Clonidine Derivatives Other (See Comments)    weak  . Gemfibrozil Other (See Comments)    Diverticulitis  . Metoprolol Tartrate     REACTION: urinating too much  . Penicillins Hives and Swelling  . Sulfonamide Derivatives Hives and Swelling  . Tekturna (Aliskiren Fumarate) Other (See Comments)    Gout   . Terazosin Hcl     REACTION: ringing in the ears  . Verapamil     REACTION: Wt gain    BP 193/73  Pulse 63  Temp(Src) 98.1 F (36.7 C) (Oral)  Resp 18  Ht 5\' 8"  (1.727 m)  Wt 183 lb 4 oz (83.122 kg)  BMI 27.86 kg/m2  SpO2 99%   Review of Systems  Constitutional: Negative for fever, chills and diaphoresis.  HENT: Positive for congestion. Negative for nosebleeds, sore throat, facial swelling, mouth sores, trouble swallowing and ear discharge.  Eyes: Negative for photophobia, discharge and visual disturbance.  Respiratory: Negative for choking, chest tightness, shortness of breath and stridor.  Cardiovascular: Negative for chest pain and palpitations.  Gastrointestinal: Positive for anal bleeding and rectal pain. Negative for nausea, vomiting, abdominal pain, blood in stool and abdominal distention.  Genitourinary: Negative for dysuria, urgency, difficulty urinating and testicular pain.  Musculoskeletal: Negative for myalgias, back pain, arthralgias and gait problem.  Skin: Negative for color change, pallor, rash and wound.  Neurological: Negative for dizziness, speech difficulty, weakness, numbness and headaches.  Hematological: Negative for adenopathy. Does not bruise/bleed easily.  Psychiatric/Behavioral: Negative for hallucinations, confusion and agitation.    Objective:   Physical Exam    Constitutional: He is oriented to person, place, and time. He appears well-developed and well-nourished. No distress.  HENT:  Head: Normocephalic.  Mouth/Throat: Oropharynx is clear and moist. No oropharyngeal exudate.  Eyes: Conjunctivae and EOM are normal. Pupils are equal, round, and reactive to light. No scleral icterus.  Neck: Normal range of motion. Neck supple. No tracheal deviation present.  Cardiovascular: Normal rate, regular rhythm and intact distal pulses.  Pulmonary/Chest: Effort normal and breath sounds normal. No respiratory distress.  Abdominal: Soft. He exhibits no distension. There is no tenderness. There is no rebound and no guarding. Hernia confirmed negative in the right inguinal area and confirmed negative in the left inguinal area.  1cm umb hernia  soft, reducible  Genitourinary: Penis normal. No penile tenderness.  Perianal skin clean with good hygiene. No pruritis. No pilonidal disease. No fissure. No abscess/fistula.   Tolerates digital and anoscopic rectal exam. Normal sphincter tone. No rectal masses.   Hemorrhoids L lat & R post (2/3 piles) quite engorged & easily prolapsing. R ant hem moderately enlarged.  Musculoskeletal: Normal range of motion. He exhibits no tenderness.  Lymphadenopathy:  He has no cervical adenopathy.  Right: No inguinal adenopathy present.  Left: No inguinal adenopathy present.  Neurological: He is alert and oriented to person, place, and time. No cranial nerve deficit. He exhibits normal muscle tone. Coordination normal.  Skin: Skin is warm and dry. No rash noted. He is not diaphoretic. No erythema. No pallor.  Psychiatric: He has a normal mood and affect. His behavior is normal. Judgment and thought content normal.    Assessment:    Hemorrhoids with daily bleeding & prolapse   Plan:    Given that he is having daily symptoms despite a good bowel regimen, I think he requires surgery. Because of 2 of the piles are prolapsing, I  think he would be a good THD candidate. He would like to think about things first. I did offer to inject the piles a few times before proceeding to surgery, however, with daily symptoms I am skeptical that injections only will make them resolve. They are too low and sensitive to tolerate banding. If he prefers PPH, I will send him to one of my partners whom does that more regularly.  The anatomy & physiology of the anorectal region was discussed. The pathophysiology of hemorrhoids and differential diagnosis was discussed. Natural history risks without surgery was discussed. I stressed the importance of a bowel regimen to have daily soft bowel movements to minimize progression of disease. Interventions such as sclerotherapy & banding were discussed.  The patient's symptoms are not adequately controlled by medicines and other non-operative treatments. I feel the risks & problems of no surgery outweigh the operative risks; therefore, I recommended surgery to treat the hemorrhoids by ligation, pexy, and possible resection.  Risks such as bleeding, infection, need for further treatment, heart attack, death, and other risks were discussed. I noted a good likelihood this will help address the problem. Goals of post-operative recovery were discussed as well. Possibility that this will not correct all symptoms was explained. Post-operative pain, bleeding, constipation, and other problems after surgery were discussed. We will work to minimize complications. Educational handouts further explaining the pathology, treatment options, and bowel regimen were given as well. Questions were answered. The patient expresses understanding & wishes to proceed with surgery.   I have re-reviewed the the patient's records, history, medications, and allergies.  I have re-examined the patient.  I again discussed intraoperative plans and goals of post-operative recovery.  The patient agrees to proceed.

## 2011-05-17 NOTE — Anesthesia Postprocedure Evaluation (Signed)
  Anesthesia Post-op Note  Patient: Mario Berg  Procedure(s) Performed: Procedure(s) (LRB): TRANSANAL HEMORRHOIDAL DEARTERIALIZATION (N/A) PEXY (N/A) HEMORRHOIDECTOMY PROLAPSED (N/A)  Patient Location: PACU  Anesthesia Type: General  Level of Consciousness: awake and alert   Airway and Oxygen Therapy: Patient Spontanous Breathing  Post-op Pain: mild  Post-op Assessment: Post-op Vital signs reviewed, Patient's Cardiovascular Status Stable, Respiratory Function Stable, Patent Airway and No signs of Nausea or vomiting  Post-op Vital Signs: stable  Complications: No apparent anesthesia complications

## 2011-05-17 NOTE — Anesthesia Preprocedure Evaluation (Signed)
Anesthesia Evaluation  Patient identified by MRN, date of birth, ID band Patient awake    Reviewed: Allergy & Precautions, H&P , NPO status , Patient's Chart, lab work & pertinent test results  Airway Mallampati: III TM Distance: <3 FB Neck ROM: Full    Dental No notable dental hx.    Pulmonary shortness of breath and with exertion,  breath sounds clear to auscultation  Pulmonary exam normal       Cardiovascular hypertension, Rhythm:Regular Rate:Normal     Neuro/Psych negative neurological ROS  negative psych ROS   GI/Hepatic negative GI ROS, Neg liver ROS,   Endo/Other  negative endocrine ROS  Renal/GU negative Renal ROS  negative genitourinary   Musculoskeletal negative musculoskeletal ROS (+)   Abdominal   Peds negative pediatric ROS (+)  Hematology negative hematology ROS (+)   Anesthesia Other Findings   Reproductive/Obstetrics negative OB ROS                           Anesthesia Physical Anesthesia Plan  ASA: II  Anesthesia Plan: General   Post-op Pain Management:    Induction: Intravenous  Airway Management Planned: Oral ETT  Additional Equipment:   Intra-op Plan:   Post-operative Plan: Extubation in OR  Informed Consent: I have reviewed the patients History and Physical, chart, labs and discussed the procedure including the risks, benefits and alternatives for the proposed anesthesia with the patient or authorized representative who has indicated his/her understanding and acceptance.   Dental advisory given  Plan Discussed with: CRNA  Anesthesia Plan Comments:         Anesthesia Quick Evaluation

## 2011-05-17 NOTE — Discharge Instructions (Signed)
ANORECTAL SURGERY: POST OP INSTRUCTIONS  1. Take your usually prescribed home medications unless otherwise directed. 2. DIET: Follow a light bland diet the first 24 hours after arrival home, such as soup, liquids, crackers, etc.  Be sure to include lots of fluids daily.  Avoid fast food or heavy meals as your are more likely to get nauseated.  Eat a low fat the next few days after surgery.   3. PAIN CONTROL: a. Pain is best controlled by a usual combination of three different methods TOGETHER: i. Ice/Heat ii. Over the counter pain medication iii. Prescription pain medication b. Most patients will experience some swelling and discomfort in the anus/rectal area. and incisions.  Ice packs or heat (30-60 minutes up to 6 times a day) will help. Use ice for the first few days to help decrease swelling and bruising, then switch to heat such as warm towels, sitz baths, warm baths, etc to help relax tight/sore spots and speed recovery.  Some people prefer to use ice alone, heat alone, alternating between ice & heat.  Experiment to what works for you.  Swelling and bruising can take several weeks to resolve.   c. It is helpful to take an over-the-counter pain medication regularly for the first few weeks.  Choose one of the following that works best for you: i. Naproxen (Aleve, etc)  Two 220mg tabs twice a day ii. Ibuprofen (Advil, etc) Three 200mg tabs four times a day (every meal & bedtime) iii. Acetaminophen (Tylenol, etc) 500-650mg four times a day (every meal & bedtime) d. A  prescription for pain medication (such as oxycodone, hydrocodone, etc) should be given to you upon discharge.  Take your pain medication as prescribed.  i. If you are having problems/concerns with the prescription medicine (does not control pain, nausea, vomiting, rash, itching, etc), please call us [phone] to see if we need to switch you to a different pain medicine that will work better for you and/or control your side effect  better. ii. If you need a refill on your pain medication, please contact your pharmacy.  They will contact our office to request authorization. Prescriptions will not be filled after 5 pm or on week-ends. 4. KEEP YOUR BOWELS REGULAR a. The goal is one bowel movement a day b. Avoid getting constipated.  Between the surgery and the pain medications, it is common to experience some constipation.  Increasing fluid intake and taking a fiber supplement (such as Metamucil, Citrucel, FiberCon, MiraLax, etc) 1-2 times a day regularly will usually help prevent this problem from occurring.  A mild laxative (prune juice, Milk of Magnesia, MiraLax, etc) should be taken according to package directions if there are no bowel movements after 48 hours. c. Watch out for diarrhea.  If you have many loose bowel movements, simplify your diet to bland foods & liquids for a few days.  Stop any stool softeners and decrease your fiber supplement.  Switching to mild anti-diarrheal medications (Kayopectate, Pepto Bismol) can help.  If this worsens or does not improve, please call us.  5. Wound Care a. Remove your bandages the day after surgery.  Unless discharge instructions indicate otherwise, leave your bandage dry and in place overnight.  Remove the bandage during your first bowel movement.   b. Allow the wound packing to fall out over the next few days.  You can trim exposed gauze / ribbon as it falls out.  You do not need to repack the wound unless instructed otherwise.  Wear an   absorbent pad or soft cotton gauze in your underwear as needed to catch any drainage and help keep the area  c. Keep the area clean and dry.  Bathe / shower every day.  Keep the area clean by showering / bathing over the incision / wound.   It is okay to soak an open wound to help wash it.  Wet wipes or showers / gentle washing after bowel movements is often less traumatic than regular toilet paper. d. You may have some styrofoam-like soft packing in  the rectum which will come out with the first bowel movement.  e. You will often notice bleeding with bowel movements.  This should slow down by the end of the first week of surgery f. Expect some drainage.  This should slow down, too, by the end of the first week of surgery.  Wear an absorbent pad or soft cotton gauze in your underwear until the drainage stops. 6. ACTIVITIES as tolerated:   a. You may resume regular (light) daily activities beginning the next day--such as daily self-care, walking, climbing stairs--gradually increasing activities as tolerated.  If you can walk 30 minutes without difficulty, it is safe to try more intense activity such as jogging, treadmill, bicycling, low-impact aerobics, swimming, etc. b. Save the most intensive and strenuous activity for last such as sit-ups, heavy lifting, contact sports, etc  Refrain from any heavy lifting or straining until you are off narcotics for pain control.   c. DO NOT PUSH THROUGH PAIN.  Let pain be your guide: If it hurts to do something, don't do it.  Pain is your body warning you to avoid that activity for another week until the pain goes down. d. You may drive when you are no longer taking prescription pain medication, you can comfortably sit for long periods of time, and you can safely maneuver your car and apply brakes. e. You may have sexual intercourse when it is comfortable.  7. FOLLOW UP in our office a. Please call [facility] at [phone] to set up an appointment to see your surgeon in the office for a follow-up appointment approximately 2 weeks after your surgery. b. Make sure that you call for this appointment the day you arrive home to insure a convenient appointment time. 10. IF YOU HAVE DISABILITY OR FAMILY LEAVE FORMS, BRING THEM TO THE OFFICE FOR PROCESSING.  DO NOT GIVE THEM TO YOUR DOCTOR.        WHEN TO CALL US [phone]: 1. Poor pain control 2. Reactions / problems with new medications (rash/itching, nausea,  etc)  3. Fever over 101.5 F (38.5 C) 4. Inability to urinate 5. Nausea and/or vomiting 6. Worsening swelling or bruising 7. Continued bleeding from incision. 8. Increased pain, redness, or drainage from the incision  The clinic staff is available to answer your questions during regular business hours (8:30am-5pm).  Please don't hesitate to call and ask to speak to one of our nurses for clinical concerns.   A surgeon from [facility] is always on call at the hospitals   If you have a medical emergency, go to the nearest emergency room or call 911.    [facility] [address], [address], [place], [place]  [address] ? MAIN: [phone] ? TOLL FREE: [phone] ? FAX [phone] [email or web address]    

## 2011-05-17 NOTE — Op Note (Signed)
05/17/2011  2:38 PM  PATIENT:  Mario Berg  71 y.o. male  Patient Care Team: Tresa Garter, MD as PCP - General Meryl Dare, MD,FACG as Consulting Physician (Gastroenterology)  PRE-OPERATIVE DIAGNOSIS:  prolapsed bleeding hemorrhoids  POST-OPERATIVE DIAGNOSIS:  prolapsed bleeding hemorrhoids  PROCEDURE:  Procedure(s): TRANSANAL HEMORRHOIDAL DEARTERIALIZATION & PEXY   SURGEON:  Surgeon(s): Ardeth Sportsman, MD  ASSISTANTS: none   ANESTHESIA:   local and general  EBL:     DRAINS: none   SPECIMEN:  No Specimen  DISPOSITION OF SPECIMEN:  N/A  COUNTS:  YES  PLAN OF CARE: Discharge to home after PACU  PATIENT DISPOSITION:  PACU - hemodynamically stable.  Delay start of Pharmacological VTE agent (>24hrs) due to surgical blood loss or risk of bleeding:  no  Indications: Patient is a 71 year old male with prolapsing hemorrhoids that numerous piles. Bleeding is well. I do not feel he is to be managed in the office alone. I recommended THD hemorrhoidal ligation and pexy. Technique risks benefits alternatives discussed. Questions answered he agreed to see.  Findings left lateral greater than right posterior greater than right anterior pile prolapse. No other rectal abnormalities.  DICTATION:   Informed consent confirmed. The patient was brought the operating room under general anesthesia. He was positioned prone. His perineum was prepped and draped in a sterile fashion. A surgical timeout confirmed our plan. I did a gentle anal dilation and placed the THD Doppler endoscope.  I identified the arterial supply to the hemorrhoidal piles. I ligated them about 5 cm proximal to the white line (6-1/2 cm proximal to the anal verge.). I did a figure of eight ligation. I then would run the stitch distally to the white line and tied down for pexy as well. I did this at 6 locations for vertical hemrrhoidopexy with the ligations: Right posterior lateral and anterior and similar on  the left side.  I did reexamination with Doppler. I found subtle arterial signals at the left anterior and right lateral. I controlled those with figure-of-eight ligation. Hemostasis was excellent. I placed a Gelfoam cylinder into the rectum. He was sent to recovery room in stable condition I discussed postop care the patient prior to surgery. About discussed with his friend as well

## 2011-05-17 NOTE — Transfer of Care (Signed)
Immediate Anesthesia Transfer of Care Note  Patient: Mario Berg  Procedure(s) Performed: Procedure(s) (LRB): TRANSANAL HEMORRHOIDAL DEARTERIALIZATION (N/A) PEXY (N/A) HEMORRHOIDECTOMY PROLAPSED (N/A)  Patient Location: PACU  Anesthesia Type: General  Level of Consciousness: awake and alert   Airway & Oxygen Therapy: Patient Spontanous Breathing and Patient connected to face mask oxygen  Post-op Assessment: Report given to PACU RN and Post -op Vital signs reviewed and stable  Post vital signs: Reviewed and stable  Complications: No apparent anesthesia complications

## 2011-05-18 ENCOUNTER — Telehealth (INDEPENDENT_AMBULATORY_CARE_PROVIDER_SITE_OTHER): Payer: Self-pay | Admitting: General Surgery

## 2011-05-18 NOTE — Telephone Encounter (Signed)
Pt calling in to discuss his indwelling catheter.  He states he would like it removed, as he has filled the collection bag fully 3 times since yesterday.  Instructed pt that Dr. Michaell Cowing usually leaves them in for a full 3 days, and offered him a nurse only visit on Monday morning to discontinue it.  He stated he would remove it himself on Sunday, as the nurse at the hospital had shown him how to do it.

## 2011-05-20 ENCOUNTER — Telehealth (INDEPENDENT_AMBULATORY_CARE_PROVIDER_SITE_OTHER): Payer: Self-pay | Admitting: General Surgery

## 2011-05-20 NOTE — Telephone Encounter (Signed)
Called with constipation after hemorrhoid surgery wanting to know if enema would be advisable.  I counseled against the enema and recommended some oral laxatives and stool softeners and continue with fiber supplementation and water.

## 2011-05-21 ENCOUNTER — Telehealth (INDEPENDENT_AMBULATORY_CARE_PROVIDER_SITE_OTHER): Payer: Self-pay

## 2011-05-21 NOTE — Telephone Encounter (Signed)
Callled pt to inform that Dr Michaell Cowing advised pt to double his metamucil,increase prune juice,warm sitz bath,walk 1hr day, and if pt could get Miralax to double dose. The pt might have to repeat the Miralax if he doesn't get any response the first time. The pt was advised to stay on liquids until he has complete BM. The pt understands.

## 2011-05-21 NOTE — Telephone Encounter (Signed)
The patient called to report that he has not had a bm since the am of surgery when he did a fleets enema.  He has been drinking plenty of water.  He only has tried what he had in the office.  He tried prune juice and bid metamucil.  He said he couldn't get out and drive somewhere to get laxatives, etc.  I asked if he had someone he could call to get it for him and he says he could call around.  He states his packing is still in and is painful.  He requested to speak to Dr Michaell Cowing but I told him I would inform the Dr and nurse to see what is advised.

## 2011-05-22 ENCOUNTER — Telehealth (INDEPENDENT_AMBULATORY_CARE_PROVIDER_SITE_OTHER): Payer: Self-pay | Admitting: General Surgery

## 2011-05-22 NOTE — Telephone Encounter (Signed)
Pt is calling to report had THD surgery 5 days ago and still no BM.  He is passing gas regularly.  He is taking Miralax, drinking prune juice, drinking up to a gallon of water daily, and walking around the house a bit.  He is taking pain medicine regularly since Sunday.  I recommended he try to take slightly less pain medicine if possible and try to walk more.  He can also increased the amount of Miralax if needed.  Reassured pt that passing gas is a good sign that there is movement in the intestinal tract.

## 2011-05-23 ENCOUNTER — Telehealth (INDEPENDENT_AMBULATORY_CARE_PROVIDER_SITE_OTHER): Payer: Self-pay

## 2011-05-23 NOTE — Telephone Encounter (Signed)
Called pt to check on him per Dr Michaell Cowing. The pt still has not had a BM since surgery. The pt was advised to double the Miralax yesterday but he didn't double the dose. The pt is having some bleeding from his bottom. I advised pt that I would call Dr Michaell Cowing a and call him back.  I spoke to Dr Michaell Cowing he advised the pt to double or triple the Miralax until the pt has a BM. The pt might need to repeat the dose again in a few hours if nothing happens. The pt was advised to stay on liquids until he has a BM. The pt was Lowella Grip that he could use an enema if he chooses per Dr Michaell Cowing. The pt was advised if he didn't have a BM to call me back so I can notify Dr Michaell Cowing and get a different plan.The pt understands.

## 2011-05-24 ENCOUNTER — Telehealth (INDEPENDENT_AMBULATORY_CARE_PROVIDER_SITE_OTHER): Payer: Self-pay

## 2011-05-24 ENCOUNTER — Telehealth (INDEPENDENT_AMBULATORY_CARE_PROVIDER_SITE_OTHER): Payer: Self-pay | Admitting: General Surgery

## 2011-05-24 NOTE — Telephone Encounter (Signed)
Pt calling back after attempting to give himself and enema.  He states he is unsure how much (if any) of the enema went where it was directed, secondary to the packing in his rectum.  Most of it ran down his leg.  The pressure in his bowels is immense, but he is unable to move them.  He gets minimal relief by taking pain meds and lying on his side.  He is also limited to asking a friend to transport him to the ER, and may not be able to go until tomorrow, but will try to get there then.

## 2011-05-24 NOTE — Telephone Encounter (Signed)
The patient called back regarding his bowel movement.  He has not had one yet. He took 4 doses of Miralax yesterday.  He is wanting to take an enema.  I told him to push fluids, take a dose of Miralax and try the enema.  Call us back in 2 hours if no relief.

## 2011-05-25 ENCOUNTER — Telehealth (INDEPENDENT_AMBULATORY_CARE_PROVIDER_SITE_OTHER): Payer: Self-pay | Admitting: General Surgery

## 2011-05-25 ENCOUNTER — Telehealth (INDEPENDENT_AMBULATORY_CARE_PROVIDER_SITE_OTHER): Payer: Self-pay

## 2011-05-25 ENCOUNTER — Encounter (HOSPITAL_COMMUNITY): Payer: Self-pay | Admitting: Emergency Medicine

## 2011-05-25 ENCOUNTER — Emergency Department (HOSPITAL_COMMUNITY)
Admission: EM | Admit: 2011-05-25 | Discharge: 2011-05-25 | Disposition: A | Discharge status: Home Health | Payer: Medicare Other | Attending: Emergency Medicine | Admitting: Emergency Medicine

## 2011-05-25 DIAGNOSIS — E785 Hyperlipidemia, unspecified: Secondary | ICD-10-CM | POA: Diagnosis not present

## 2011-05-25 DIAGNOSIS — K649 Unspecified hemorrhoids: Secondary | ICD-10-CM | POA: Diagnosis not present

## 2011-05-25 DIAGNOSIS — I10 Essential (primary) hypertension: Secondary | ICD-10-CM | POA: Diagnosis not present

## 2011-05-25 DIAGNOSIS — Z79899 Other long term (current) drug therapy: Secondary | ICD-10-CM | POA: Insufficient documentation

## 2011-05-25 DIAGNOSIS — K589 Irritable bowel syndrome without diarrhea: Secondary | ICD-10-CM | POA: Diagnosis not present

## 2011-05-25 DIAGNOSIS — R339 Retention of urine, unspecified: Secondary | ICD-10-CM | POA: Diagnosis not present

## 2011-05-25 DIAGNOSIS — K5900 Constipation, unspecified: Secondary | ICD-10-CM | POA: Insufficient documentation

## 2011-05-25 MED ORDER — POLYETHYLENE GLYCOL 3350 17 GM/SCOOP PO POWD: 17.0000 g | Freq: Every day | ORAL | Status: AC | PRN

## 2011-05-25 MED ORDER — BISACODYL 10 MG RE SUPP: 10.0000 mg | Freq: Every day | RECTAL | Status: DC | PRN

## 2011-05-25 MED ORDER — BISACODYL 10 MG RE SUPP
10.0000 mg | Freq: Once | RECTAL | Status: AC
  Administered 2011-05-25: 10 mg via RECTAL
  Filled 2011-05-25: qty 1

## 2011-05-25 MED ORDER — DOCUSATE SODIUM 100 MG PO CAPS: 100.0000 mg | ORAL_CAPSULE | Freq: Two times a day (BID) | ORAL | Status: DC | PRN

## 2011-05-25 NOTE — Discharge Instructions (Signed)
We discussed your case with your general surgeons. Drink plenty of fluids, get adequate fiber in diet, take stool softener (colace), limit narcotic pain medication use (and instead try your anti-inflammatory pain medication use), take miralax as need. You may also try dulcolax suppository and fleets enemas as need. Follow up with your surgeon in the office in the next couple days for recheck - call office today to arrange follow up with them.  Return to ER if worse, worsening or severe abdominal pain, persistent vomiting, fevers, other concern.        Constipation in Adults Constipation is having fewer than 2 bowel movements per week. Usually, the stools are hard. As we grow older, constipation is more common. If you try to fix constipation with laxatives, the problem may get worse. This is because laxatives taken over a long period of time make the colon muscles weaker. A low-fiber diet, not taking in enough fluids, and taking some medicines may make these problems worse. MEDICATIONS THAT MAY CAUSE CONSTIPATION  Water pills (diuretics).   Calcium channel blockers (used to control blood pressure and for the heart).   Certain pain medicines (narcotics).   Anticholinergics.   Anti-inflammatory agents.   Antacids that contain aluminum.  DISEASES THAT CONTRIBUTE TO CONSTIPATION  Diabetes.   Parkinson's disease.   Dementia.   Stroke.   Depression.   Illnesses that cause problems with salt and water metabolism.  HOME CARE INSTRUCTIONS   Constipation is usually best cared for without medicines. Increasing dietary fiber and eating more fruits and vegetables is the best way to manage constipation.   Slowly increase fiber intake to 25 to 38 grams per day. Whole grains, fruits, vegetables, and legumes are good sources of fiber. A dietitian can further help you incorporate high-fiber foods into your diet.   Drink enough water and fluids to keep your urine clear or pale yellow.   A  fiber supplement may be added to your diet if you cannot get enough fiber from foods.   Increasing your activities also helps improve regularity.   Suppositories, as suggested by your caregiver, will also help. If you are using antacids, such as aluminum or calcium containing products, it will be helpful to switch to products containing magnesium if your caregiver says it is okay.   If you have been given a liquid injection (enema) today, this is only a temporary measure. It should not be relied on for treatment of longstanding (chronic) constipation.   Stronger measures, such as magnesium sulfate, should be avoided if possible. This may cause uncontrollable diarrhea. Using magnesium sulfate may not allow you time to make it to the bathroom.  SEEK IMMEDIATE MEDICAL CARE IF:   There is bright red blood in the stool.   The constipation stays for more than 4 days.   There is belly (abdominal) or rectal pain.   You do not seem to be getting better.   You have any questions or concerns.  MAKE SURE YOU:   Understand these instructions.   Will watch your condition.   Will get help right away if you are not doing well or get worse.  Document Released: 11/18/2003 Document Revised: 02/08/2011 Document Reviewed: 01/23/2011 [facility] Patient Information 2012 [place], [place].

## 2011-05-25 NOTE — Telephone Encounter (Signed)
Pt advised this morning to go to ED, which he did.  He reports they gave him a suppository and a "water enema," both with minimal results and no real relief.  He is calling to ask if a "complete colon prep, like for a colonoscopy" would be something he could try next.  He was strongly admonished NOT to take with mag citrate, both by Iona Hansen, LPN, and the ED physician.  But he is still looking for relief of a bowel movement.  Please advise.

## 2011-05-25 NOTE — ED Provider Notes (Signed)
History     CSN: [id number]  Arrival date & time 05/25/11  1005   First MD Initiated Contact with Patient 05/25/11 1017      Chief Complaint  Patient presents with  . Constipation    (Consider location/radiation/quality/duration/timing/severity/associated sxs/prior treatment) Patient is a 71 y.o. male presenting with constipation. The history is provided by the patient.  Constipation  Pertinent negatives include no fever, no vomiting and no coughing.  pt c/o constipation for past several days since hemorrhoid surgery (transanal hemorrhoid dearterialization) 3/14 by Dr Michaell Cowing. Is taking oxycodone prn for pain. Is eating/drinking. No vomiting. Intermittent abd crampy pain, no constant/focal pain. No fever or chills. Has tried miralax at home without relief. States tried a single enema yesterday, but states not sure if did it correctly. Is passing gas regularly. Also passing small amt blood tinged mucous/tiny amts stool. States prior to surgery has had chronic intermittent problems w constipation for years. No abrupt increase in rectal pain, states slowly better since surgery, other than urge to defecate.   Past Medical History  Diagnosis Date  . Diverticulitis, colon   . Diverticulosis of colon   . Gout   . Hyperlipidemia   . Hypertension     Dr. Melburn Popper  . LBP (low back pain)   . OA (osteoarthritis)   . BPH (benign prostatic hyperplasia)   . IBS (irritable bowel syndrome)     Dr. Russella Dar  C/D  . Hemorrhoids   . Adenomatous polyp 04/2007  . Nasal congestion   . Rectal bleeding   . DIVERTICULITIS OF COLON 05/06/2007  . Gout   . Shortness of breath     [place]  . Umbilical hernia     NO PAIN-BUT PT WORRIES ABOUT IT--WEARS A BACK BRACE FOR SUPPORT AT TIMES    Past Surgical History  Procedure Date  . Skin cancer excision 12-21-2006    Nose    Family History  Problem Relation Age of Onset  . Heart failure Mother   . Hypertension Mother   . Hypertension Father   .  Heart failure Father   . Colon cancer Neg Hx     History  Substance Use Topics  . Smoking status: Former Smoker    Quit date: 06/03/1988  . Smokeless tobacco: Never Used  . Alcohol Use: No      Review of Systems  Constitutional: Negative for fever and chills.  Respiratory: Negative for cough and shortness of breath.   Gastrointestinal: Positive for constipation. Negative for vomiting and abdominal distention.  Genitourinary: Negative for decreased urine volume.  Musculoskeletal: Negative for back pain.    Allergies  Atorvastatin; Furosemide; Bystolic; Clams; Clonidine derivatives; Gemfibrozil; Metoprolol tartrate; Penicillins; Sulfonamide derivatives; Tekturna; Terazosin hcl; and Verapamil  Home Medications   Current Outpatient Rx  Name Route Sig Dispense Refill  . CALCIUM 1200 PO Oral Take 0.5 tablets by mouth daily.     Marland Kitchen COENZYME Q10 60 MG PO TABS Oral Take 1 tablet by mouth daily.     Marland Kitchen DICLOFENAC SODIUM 1.5 % TD SOLN Apply externally Apply 5 drops topically 4 (four) times daily as needed. Pain     . INDOMETHACIN 25 MG PO CAPS Oral Take 25 mg by mouth 2 (two) times daily as needed. Gout. Pt will take first before the 50 mg capsule.    . INDOMETHACIN 50 MG PO CAPS Oral Take 50 mg by mouth 2 (two) times daily as needed. Gout     . LISINOPRIL 20 MG  PO TABS Oral Take 20 mg by mouth 2 (two) times daily.    . METHYLCELLULOSE (LAXATIVE) PO POWD Oral Take 1 packet by mouth at bedtime.     Marland Kitchen ONE-DAILY MULTI VITAMINS PO TABS Oral Take 1 tablet by mouth daily.     Marland Kitchen FISH OIL 1200 MG PO CAPS Oral Take 1 capsule by mouth daily.     Marland Kitchen EYE WASH OP SOLN Both Eyes Place 2-4 drops into both eyes every morning.    . OXYCODONE HCL 5 MG PO TABS Oral Take 1-2 tablets (5-10 mg total) by mouth every 4 (four) hours as needed for pain. 50 tablet 0  . OXYMETAZOLINE HCL 0.05 % NA SOLN Nasal Place 2 sprays into the nose 2 (two) times daily.    . TRAMADOL HCL 50 MG PO TABS Oral Take 50-100 mg by  mouth 2 (two) times daily as needed. Pain     . TRIAMCINOLONE ACETONIDE 0.5 % EX CREA Topical Apply 1 application topically 3 (three) times daily as needed. Rash       BP 169/81  Pulse 96  Temp(Src) 98.1 F (36.7 C) (Oral)  Resp 18  SpO2 100%  Physical Exam  Nursing note and vitals reviewed. Constitutional: He is oriented to person, place, and time. He appears well-developed and well-nourished. No distress.  HENT:  Head: Atraumatic.  Eyes: Pupils are equal, round, and reactive to light.  Neck: Neck supple. No tracheal deviation present.  Cardiovascular: Normal rate.   Pulmonary/Chest: Effort normal. No accessory muscle usage. No respiratory distress.  Abdominal: Soft. Bowel sounds are normal. He exhibits no distension. There is no tenderness.  Genitourinary:       Mod-large amt soft formed stool in rectum, brown, no melena. Streaks brb noted, no active/heavy bleeding.   Musculoskeletal: Normal range of motion.  Neurological: He is alert and oriented to person, place, and time.  Skin: Skin is warm and dry.    ED Course  Procedures (including critical care time)     MDM  Pt unable to tolerate manual disimpaction. Pt w mod-large amount soft formed stool in rectum. Discussed trial of combination stool softener, po fluids, fiber, miralax, limiting narcotic pain med use, w enema prn.   abd soft nt. No emesis. No fevers.   [facility] called - discussed pt their provider for Dr Michaell Cowing, Marlyne Beards - he indicates rec dec oxycodone, motrin prn, inc miralax, f/u in office as outpt.     Suzi Roots, MD 05/25/11 1128

## 2011-05-25 NOTE — Telephone Encounter (Signed)
After speaking with Dr.Gross, pt updated with the MD recommendation to try the [facility] one-day home colon prep.  I explained to Mario Berg how to mix and take the Miralax with Gatorade and really push clear liquids in-between the doses.  He was able to tell me the instructions to satisfy me that he understands.  He reiterated that he is passing gas and is not distended. Mario Berg stated he will try the Miralax and Gatorade prep.

## 2011-05-25 NOTE — Telephone Encounter (Signed)
Pt called stating he did not go to ER as he was directed yesterday by our office. Pt states he is becoming more uncomfortable and now having difficulty voiding. Pt states a friend came by and suggested magnesium citrate. I advised pt that this is a harsh laxative and should not be taken if pt is possibly impacted. Pt advised that he should do as recommended yesterday, go to ER be evaluated and treated asap. Pt states he will call his ride now and go to ER.

## 2011-05-25 NOTE — ED Notes (Signed)
Pt states he is not had a bowel movement in 9 days, he had at [facility] surgery on March 14, (ligation of a 3 degree prolapsed hemorrhoid).   )

## 2011-05-26 ENCOUNTER — Telehealth (INDEPENDENT_AMBULATORY_CARE_PROVIDER_SITE_OTHER): Payer: Self-pay | Admitting: General Surgery

## 2011-05-26 ENCOUNTER — Encounter (HOSPITAL_COMMUNITY): Payer: Self-pay

## 2011-05-26 ENCOUNTER — Emergency Department (HOSPITAL_COMMUNITY): Payer: Medicare Other

## 2011-05-26 ENCOUNTER — Emergency Department (HOSPITAL_COMMUNITY)
Admission: EM | Admit: 2011-05-26 | Discharge: 2011-05-26 | Disposition: A | Discharge status: Home / Self Care | Payer: Medicare Other | Attending: Emergency Medicine | Admitting: Emergency Medicine

## 2011-05-26 DIAGNOSIS — Z8639 Personal history of other endocrine, nutritional and metabolic disease: Secondary | ICD-10-CM | POA: Insufficient documentation

## 2011-05-26 DIAGNOSIS — R339 Retention of urine, unspecified: Secondary | ICD-10-CM | POA: Insufficient documentation

## 2011-05-26 DIAGNOSIS — Z862 Personal history of diseases of the blood and blood-forming organs and certain disorders involving the immune mechanism: Secondary | ICD-10-CM | POA: Insufficient documentation

## 2011-05-26 DIAGNOSIS — R141 Gas pain: Secondary | ICD-10-CM | POA: Insufficient documentation

## 2011-05-26 DIAGNOSIS — I10 Essential (primary) hypertension: Secondary | ICD-10-CM | POA: Insufficient documentation

## 2011-05-26 DIAGNOSIS — K921 Melena: Secondary | ICD-10-CM | POA: Insufficient documentation

## 2011-05-26 DIAGNOSIS — R110 Nausea: Secondary | ICD-10-CM | POA: Insufficient documentation

## 2011-05-26 DIAGNOSIS — K589 Irritable bowel syndrome without diarrhea: Secondary | ICD-10-CM | POA: Insufficient documentation

## 2011-05-26 DIAGNOSIS — E785 Hyperlipidemia, unspecified: Secondary | ICD-10-CM | POA: Insufficient documentation

## 2011-05-26 DIAGNOSIS — R109 Unspecified abdominal pain: Secondary | ICD-10-CM | POA: Insufficient documentation

## 2011-05-26 DIAGNOSIS — Z79899 Other long term (current) drug therapy: Secondary | ICD-10-CM | POA: Insufficient documentation

## 2011-05-26 DIAGNOSIS — K6289 Other specified diseases of anus and rectum: Secondary | ICD-10-CM | POA: Diagnosis not present

## 2011-05-26 DIAGNOSIS — R142 Eructation: Secondary | ICD-10-CM | POA: Insufficient documentation

## 2011-05-26 DIAGNOSIS — K649 Unspecified hemorrhoids: Secondary | ICD-10-CM | POA: Diagnosis not present

## 2011-05-26 DIAGNOSIS — K5900 Constipation, unspecified: Secondary | ICD-10-CM | POA: Diagnosis not present

## 2011-05-26 DIAGNOSIS — K5641 Fecal impaction: Secondary | ICD-10-CM | POA: Diagnosis not present

## 2011-05-26 DIAGNOSIS — M1990 Unspecified osteoarthritis, unspecified site: Secondary | ICD-10-CM | POA: Insufficient documentation

## 2011-05-26 LAB — BASIC METABOLIC PANEL
BUN: 16 mg/dL (ref 6–23)
CO2: 25 mEq/L (ref 19–32)
Calcium: 9.6 mg/dL (ref 8.4–10.5)
Creatinine, Ser: 1.34 mg/dL (ref 0.50–1.35)
GFR calc non Af Amer: 52 mL/min — ABNORMAL LOW (ref >90)
Glucose, Bld: 117 mg/dL — ABNORMAL HIGH (ref 70–99)

## 2011-05-26 LAB — CBC
HCT: 43.1 % (ref 39.0–52.0)
MCH: 31.6 pg (ref 26.0–34.0)
MCV: 88 fL (ref 78.0–100.0)
RBC: 4.9 MIL/uL (ref 4.22–5.81)
WBC: 15.6 10*3/uL — ABNORMAL HIGH (ref 4.0–10.5)

## 2011-05-26 LAB — DIFFERENTIAL
Eosinophils Absolute: 0.1 10*3/uL (ref 0.0–0.7)
Eosinophils Relative: 0 % (ref 0–5)
Lymphocytes Relative: 6 % — ABNORMAL LOW (ref 12–46)
Lymphs Abs: 0.9 10*3/uL (ref 0.7–4.0)
Monocytes Absolute: 1.9 10*3/uL — ABNORMAL HIGH (ref 0.1–1.0)

## 2011-05-26 MED ORDER — FLEET ENEMA 7-19 GM/118ML RE ENEM
ENEMA | RECTAL | Status: AC
  Filled 2011-05-26: qty 1

## 2011-05-26 MED ORDER — FLEET ENEMA 7-19 GM/118ML RE ENEM
1.0000 | ENEMA | Freq: Once | RECTAL | Status: AC
  Administered 2011-05-26: 1 via RECTAL

## 2011-05-26 NOTE — ED Notes (Signed)
Pt. Denies pain

## 2011-05-26 NOTE — Telephone Encounter (Signed)
He had hemorrhoid surgery by Dr. Michaell Cowing 1-1.5 weeks ago.  He had acute urinary retention postop and went home with a foley catheter which was eventually removed.  He has been having problems with constipation ever since the operation.  He has had 5-6 small soft BMs a day recently but still feels full.  He was told to take Gatorade a Miralax yesterday and has been drinking water.  However, he has not voided since last night.  I told him I was most concerned with his lack of urine output (none in over 12 hours by his report) and am concerned he might have acute urinary retention again or acute renal insufficiency.  I instructed him to go back to the ED (he was there yesterday because of the concern for constipation) and be evaluated for acute renal insufficiency.

## 2011-05-26 NOTE — ED Notes (Signed)
Fleets enema administered with minimal results: soft, formed, brown stool with blood noted in bedside commode.

## 2011-05-26 NOTE — ED Provider Notes (Addendum)
History     CSN: [id number]  Arrival date & time 05/26/11  1816   First MD Initiated Contact with Patient 05/26/11 1833      Chief Complaint  Patient presents with  . Urinary Retention  . Constipation    (Consider location/radiation/quality/duration/timing/severity/associated sxs/prior treatment) HPI  Past Medical History  Diagnosis Date  . Diverticulitis, colon   . Diverticulosis of colon   . Gout   . Hyperlipidemia   . Hypertension     Dr. Melburn Popper  . LBP (low back pain)   . OA (osteoarthritis)   . BPH (benign prostatic hyperplasia)   . IBS (irritable bowel syndrome)     Dr. Russella Dar  C/D  . Hemorrhoids   . Adenomatous polyp 04/2007  . Nasal congestion   . Rectal bleeding   . DIVERTICULITIS OF COLON 05/06/2007  . Gout   . Shortness of breath     [place]  . Umbilical hernia     NO PAIN-BUT PT WORRIES ABOUT IT--WEARS A BACK BRACE FOR SUPPORT AT TIMES    Past Surgical History  Procedure Date  . Skin cancer excision 12-21-2006    Nose  . Thd     05/17/2011    Family History  Problem Relation Age of Onset  . Heart failure Mother   . Hypertension Mother   . Hypertension Father   . Heart failure Father   . Colon cancer Neg Hx     History  Substance Use Topics  . Smoking status: Former Smoker    Quit date: 06/03/1988  . Smokeless tobacco: Never Used  . Alcohol Use: No      Review of Systems  Allergies  Atorvastatin; Furosemide; Bystolic; Clams; Clonidine derivatives; Gemfibrozil; Metoprolol tartrate; Penicillins; Sulfonamide derivatives; Tekturna; Terazosin hcl; and Verapamil  Home Medications   Current Outpatient Rx  Name Route Sig Dispense Refill  . CALCIUM 1200 PO Oral Take 0.5 tablets by mouth daily.     Marland Kitchen COENZYME Q10 60 MG PO TABS Oral Take 1 tablet by mouth daily.     Marland Kitchen DICLOFENAC SODIUM 1.5 % TD SOLN Apply externally Apply 5 drops topically 4 (four) times daily as needed. Pain     . INDOMETHACIN 25 MG PO CAPS Oral Take 25 mg by  mouth 2 (two) times daily as needed. Gout. Pt will take first before the 50 mg capsule.    . INDOMETHACIN 50 MG PO CAPS Oral Take 50 mg by mouth 2 (two) times daily as needed. Gout     . LISINOPRIL 20 MG PO TABS Oral Take 20 mg by mouth 2 (two) times daily.    Marland Kitchen ONE-DAILY MULTI VITAMINS PO TABS Oral Take 1 tablet by mouth daily.     Marland Kitchen FISH OIL 1200 MG PO CAPS Oral Take 1 capsule by mouth daily.     . OXYCODONE HCL 5 MG PO TABS Oral Take 1-2 tablets (5-10 mg total) by mouth every 4 (four) hours as needed for pain. 50 tablet 0  . OXYMETAZOLINE HCL 0.05 % NA SOLN Nasal Place 2 sprays into the nose 2 (two) times daily.    Marland Kitchen POLYETHYLENE GLYCOL 3350 PO POWD Oral Take 17 g by mouth daily as needed. 255 g 0  . PRAMOX-PE-GLYCERIN-PETROLATUM 1-0.25-14.4-15 % RE CREA Rectal Place 1 application rectally daily.    . PSYLLIUM 95 % PO PACK Oral Take 1 packet by mouth daily.      BP 182/90  Pulse 91  Temp(Src) 98 F (36.7 C) (  Oral)  Resp 20  SpO2 98%  Physical Exam  ED Course  Procedures (including critical care time)  Labs Reviewed  CBC - Abnormal; Notable for the following:    WBC 15.6 (*)    All other components within normal limits  DIFFERENTIAL - Abnormal; Notable for the following:    Neutrophils Relative 82 (*)    Neutro Abs 12.8 (*)    Lymphocytes Relative 6 (*)    Monocytes Absolute 1.9 (*)    All other components within normal limits  BASIC METABOLIC PANEL - Abnormal; Notable for the following:    Sodium 133 (*)    Glucose, Bld 117 (*)    GFR calc non Af Amer 52 (*)    GFR calc Af Amer 61 (*)    All other components within normal limits   Dg Abd 2 Views  05/26/2011  *RADIOLOGY REPORT*  Clinical Data: Evaluate stool burden.  Post hemorrhoids surgery. Unable to void.  ABDOMEN - 2 VIEW  Comparison: 12/06/2004 CT.  Findings: Moderate amount of stool rectosigmoid region.  Gas filled nondistended colon and small bowel loops otherwise noted.  No free intraperitoneal air.  IMPRESSION:  Moderate amount of stool rectosigmoid region.  Original Report Authenticated By: Fuller Canada, M.D.     1. Urinary retention   2. Constipation       MDM  Medical screening examination/treatment/procedure(s) were conducted as a shared visit with non-physician practitioner(s) and myself.  I personally evaluated the patient during the encounter  Patient has a large amount of urinary output after Foley catheter inserted. Digital disimpaction performed by physician's assistant. No acute abdomen. I discussed patient's situation in great detail. He will follow up with Gen. surgery on Monday.  He also knows to return the emergency department if his urinary retention return       Donnetta Hutching, MD 05/26/11 2224  Donnetta Hutching, MD 05/26/11 2225

## 2011-05-26 NOTE — ED Provider Notes (Signed)
Medical screening examination/treatment/procedure(s) were conducted as a shared visit with non-physician practitioner(s) and myself.  I personally evaluated the patient during the encounter.  Patient is status post hemorrhoidectomy approximately 10 days ago. He has been severely constipated. Today he has had severe urinary retention.  Digital disimpaction by physician's assistant..  Foley catheter inserted with large amount of urine output.  Patient feeling much better. See general surgeon on Monday. Increase fluids, stool softeners, fleets enema, small amount of Mag citrate  Donnetta Hutching, MD 05/26/11 2332

## 2011-05-26 NOTE — Discharge Instructions (Signed)
Constipation in Adults Constipation is having fewer than 2 bowel movements per week. Usually, the stools are hard. As we grow older, constipation is more common. If you try to fix constipation with laxatives, the problem may get worse. This is because laxatives taken over a long period of time make the colon muscles weaker. A low-fiber diet, not taking in enough fluids, and taking some medicines may make these problems worse. MEDICATIONS THAT MAY CAUSE CONSTIPATION  Water pills (diuretics).   Calcium channel blockers (used to control blood pressure and for the heart).   Certain pain medicines (narcotics).   Anticholinergics.   Anti-inflammatory agents.   Antacids that contain aluminum.  DISEASES THAT CONTRIBUTE TO CONSTIPATION  Diabetes.   Parkinson's disease.   Dementia.   Stroke.   Depression.   Illnesses that cause problems with salt and water metabolism.  HOME CARE INSTRUCTIONS   Constipation is usually best cared for without medicines. Increasing dietary fiber and eating more fruits and vegetables is the best way to manage constipation.   Slowly increase fiber intake to 25 to 38 grams per day. Whole grains, fruits, vegetables, and legumes are good sources of fiber. A dietitian can further help you incorporate high-fiber foods into your diet.   Drink enough water and fluids to keep your urine clear or pale yellow.   A fiber supplement may be added to your diet if you cannot get enough fiber from foods.   Increasing your activities also helps improve regularity.   Suppositories, as suggested by your caregiver, will also help. If you are using antacids, such as aluminum or calcium containing products, it will be helpful to switch to products containing magnesium if your caregiver says it is okay.   If you have been given a liquid injection (enema) today, this is only a temporary measure. It should not be relied on for treatment of longstanding (chronic) constipation.    Stronger measures, such as magnesium sulfate, should be avoided if possible. This may cause uncontrollable diarrhea. Using magnesium sulfate may not allow you time to make it to the bathroom.  SEEK IMMEDIATE MEDICAL CARE IF:   There is bright red blood in the stool.   The constipation stays for more than 4 days.   There is belly (abdominal) or rectal pain.   You do not seem to be getting better.   You have any questions or concerns.  MAKE SURE YOU:   Understand these instructions.   Will watch your condition.   Will get help right away if you are not doing well or get worse.  Document Released: 11/18/2003 Document Revised: 02/08/2011 Document Reviewed: 01/23/2011 [facility] Patient Information 2012 [place], [place].  Increase fluids, fleets enema with left side down. Also recommend magnesium citrate and Colace which is a stool softener.  Try to see Dr. Michaell Cowing on Monday for followup.  Return to the emergency Department if you're unable to pass your urine

## 2011-05-26 NOTE — ED Provider Notes (Signed)
History     CSN: [id number]  Arrival date & time 05/26/11  1816   First MD Initiated Contact with Patient 05/26/11 1833      Chief Complaint  Patient presents with  . Urinary Retention  . Constipation    (Consider location/radiation/quality/duration/timing/severity/associated sxs/prior treatment) HPI Comments: Patient presents with complaint of urinary retention and constipation 10 days after having surgery for hemorrhoids. Patient states he was seen in emergency department yesterday for constipation. Records show surgery give recommendations to give MiraLAX and decreased amount of narcotic medication patient was taking. Patient returns today with inability to urinate for 24 hours as well as suprapubic pain and tenderness. Today patient did a "purged" which consisted of Gatorade and half a bottle of MiraLAX. Patient did not have a bowel movement. He states he is passing some liquid mixed with blood. Nausea but no vomiting. No fever.  Patient is a 71 y.o. male presenting with constipation. The history is provided by the patient.  Constipation  The current episode started more than 1 week ago. The onset was gradual. The patient is experiencing no pain. The stool is described as liquid. There was no prior successful therapy. Prior unsuccessful therapies include stool softeners, enemas and laxatives. Associated symptoms include abdominal pain, nausea and rectal pain. Pertinent negatives include no fever, no diarrhea, no vomiting, no hematuria, no chest pain, no headaches, no coughing and no rash.    Past Medical History  Diagnosis Date  . Diverticulitis, colon   . Diverticulosis of colon   . Gout   . Hyperlipidemia   . Hypertension     Dr. Melburn Popper  . LBP (low back pain)   . OA (osteoarthritis)   . BPH (benign prostatic hyperplasia)   . IBS (irritable bowel syndrome)     Dr. Russella Dar  C/D  . Hemorrhoids   . Adenomatous polyp 04/2007  . Nasal congestion   . Rectal bleeding   .  DIVERTICULITIS OF COLON 05/06/2007  . Gout   . Shortness of breath     [place]  . Umbilical hernia     NO PAIN-BUT PT WORRIES ABOUT IT--WEARS A BACK BRACE FOR SUPPORT AT TIMES    Past Surgical History  Procedure Date  . Skin cancer excision 12-21-2006    Nose  . Thd     05/17/2011    Family History  Problem Relation Age of Onset  . Heart failure Mother   . Hypertension Mother   . Hypertension Father   . Heart failure Father   . Colon cancer Neg Hx     History  Substance Use Topics  . Smoking status: Former Smoker    Quit date: 06/03/1988  . Smokeless tobacco: Never Used  . Alcohol Use: No      Review of Systems  Constitutional: Negative for fever.  HENT: Negative for sore throat and rhinorrhea.   Eyes: Negative for redness.  Respiratory: Negative for cough.   Cardiovascular: Negative for chest pain.  Gastrointestinal: Positive for nausea, abdominal pain, constipation, blood in stool, abdominal distention and rectal pain. Negative for vomiting and diarrhea.  Genitourinary: Positive for decreased urine volume and difficulty urinating. Negative for dysuria, hematuria, flank pain and testicular pain.  Musculoskeletal: Negative for myalgias.  Skin: Negative for rash.  Neurological: Negative for headaches.    Allergies  Atorvastatin; Furosemide; Bystolic; Clams; Clonidine derivatives; Gemfibrozil; Metoprolol tartrate; Penicillins; Sulfonamide derivatives; Tekturna; Terazosin hcl; and Verapamil  Home Medications   Current Outpatient Rx  Name Route Sig  Dispense Refill  . BISACODYL 10 MG RE SUPP Rectal Place 1 suppository (10 mg total) rectally daily as needed for constipation. 12 suppository 0  . CALCIUM 1200 PO Oral Take 0.5 tablets by mouth daily.     Marland Kitchen COENZYME Q10 60 MG PO TABS Oral Take 1 tablet by mouth daily.     Marland Kitchen DICLOFENAC SODIUM 1.5 % TD SOLN Apply externally Apply 5 drops topically 4 (four) times daily as needed. Pain     . DOCUSATE SODIUM 100 MG  PO CAPS Oral Take 1 capsule (100 mg total) by mouth 2 (two) times daily as needed for constipation. 30 capsule 0  . INDOMETHACIN 50 MG PO CAPS Oral Take 50 mg by mouth 2 (two) times daily as needed. Gout     . LISINOPRIL 20 MG PO TABS Oral Take 20 mg by mouth 2 (two) times daily.    Marland Kitchen ONE-DAILY MULTI VITAMINS PO TABS Oral Take 1 tablet by mouth daily.     Marland Kitchen FISH OIL 1200 MG PO CAPS Oral Take 1 capsule by mouth daily.     . OXYCODONE HCL 5 MG PO TABS Oral Take 1-2 tablets (5-10 mg total) by mouth every 4 (four) hours as needed for pain. 50 tablet 0  . INDOMETHACIN 25 MG PO CAPS Oral Take 25 mg by mouth 2 (two) times daily as needed. Gout. Pt will take first before the 50 mg capsule.    Marland Kitchen OXYMETAZOLINE HCL 0.05 % NA SOLN Nasal Place 2 sprays into the nose 2 (two) times daily.    Marland Kitchen POLYETHYLENE GLYCOL 3350 PO POWD Oral Take 17 g by mouth daily as needed. 255 g 0  . TRAMADOL HCL 50 MG PO TABS Oral Take 50-100 mg by mouth 2 (two) times daily as needed. Pain     . TRIAMCINOLONE ACETONIDE 0.5 % EX CREA Topical Apply 1 application topically 3 (three) times daily as needed. Rash       BP 182/90  Pulse 91  Temp(Src) 98 F (36.7 C) (Oral)  Resp 20  SpO2 98%  Physical Exam  Nursing note and vitals reviewed. Constitutional: He is oriented to person, place, and time. He appears well-developed and well-nourished.  HENT:  Head: Normocephalic and atraumatic.  Eyes: Conjunctivae are normal. Right eye exhibits no discharge. Left eye exhibits no discharge.  Neck: Normal range of motion. Neck supple.  Cardiovascular: Normal rate, regular rhythm and normal heart sounds.   Pulmonary/Chest: Effort normal and breath sounds normal.  Abdominal: Soft. He exhibits distension. Bowel sounds are decreased. There is generalized tenderness. There is no rebound, no guarding and negative Murphy's sign.  Genitourinary: Rectal exam shows tenderness. Rectal exam shows no external hemorrhoid, no internal hemorrhoid and  anal tone normal.       Patient is impacted with a moderate to large amount of soft stool in rectal vault. Areas of surgery palpated and appear to be healing well. Minimal tenderness. There is some red blood mixed in with the soft stool.  Neurological: He is alert and oriented to person, place, and time.  Skin: Skin is warm and dry.  Psychiatric: He has a normal mood and affect.    ED Course  Procedures (including critical care time)  Labs Reviewed  CBC - Abnormal; Notable for the following:    WBC 15.6 (*)    All other components within normal limits  DIFFERENTIAL - Abnormal; Notable for the following:    Neutrophils Relative 82 (*)    Neutro Abs  12.8 (*)    Lymphocytes Relative 6 (*)    Monocytes Absolute 1.9 (*)    All other components within normal limits  BASIC METABOLIC PANEL   Dg Abd 2 Views  05/26/2011  *RADIOLOGY REPORT*  Clinical Data: Evaluate stool burden.  Post hemorrhoids surgery. Unable to void.  ABDOMEN - 2 VIEW  Comparison: 12/06/2004 CT.  Findings: Moderate amount of stool rectosigmoid region.  Gas filled nondistended colon and small bowel loops otherwise noted.  No free intraperitoneal air.  IMPRESSION: Moderate amount of stool rectosigmoid region.  Original Report Authenticated By: Fuller Canada, M.D.     1. Urinary retention   2. Constipation     7:21 PM Patient seen and examined. Work-up initiated.   Vital signs reviewed and are as follows: Filed Vitals:   05/26/11 1827  BP: 182/90  Pulse: 91  Temp: 98 F (36.7 C)  Resp: 20   Patient was discussed with Donnetta Hutching, MD  Manual disimpaction attempted. Moderate amount of soft stool mixed with some blood removed from rectal vault.   Approx 1500cc out after foley placement.   Patient stated he felt like he needed to try to have a bowel movement. I asked the nurse to bring a bedside commode into the room.  8:21 PM Handoff to Dr. Adriana Simas who will see and dispo appropriately.    MDM  Urinary retention  2/2 to stool burden. Manual disimpaction attempted.        Renne Crigler, [place] 05/26/11 2024

## 2011-05-26 NOTE — ED Notes (Addendum)
Pt. Attempted to have BM with minimal output. Per pt. He had the urge but was unable to put out much. His stool was formed.

## 2011-05-26 NOTE — ED Notes (Signed)
Patient transported to X-ray 

## 2011-05-26 NOTE — ED Notes (Signed)
Pt presents with c/o of urinary retention and constipation- urinary retention x 1 day and no BM for 10 days post enema in [facility].  Denies N/V/D and fever

## 2011-05-28 ENCOUNTER — Telehealth (INDEPENDENT_AMBULATORY_CARE_PROVIDER_SITE_OTHER): Payer: Self-pay

## 2011-05-28 NOTE — Telephone Encounter (Signed)
Pt calling in to give a report on his bowel situation. The pt ended up back in the ER over the weekend still with not having a BM after taking all the Miralax. The pt had a distended belly and was having so much pain with no relief of a BM. The pt got disempacted at the ER and has had several BM's now. The pt feels better but is still having problems with slow urination. The pt is able to urinate on his own but its slow per the pt. I advised that if the urination did not get better he needed to call me back b/c we will need to refer him to urology. The pt was given his f/u appt. The pt understands.

## 2011-05-30 ENCOUNTER — Encounter (HOSPITAL_COMMUNITY): Payer: Self-pay | Admitting: Surgery

## 2011-05-31 ENCOUNTER — Telehealth (INDEPENDENT_AMBULATORY_CARE_PROVIDER_SITE_OTHER): Payer: Self-pay

## 2011-05-31 NOTE — Telephone Encounter (Signed)
Pt calling in b/c still not happy after surgery from the [facility]. The pt is still having discomfort, leakage, and stools the shape of small lasagna noodles. The pt feels he can't leave his home b/c of the leakage. I did advise the pt that this surgery is a slow healing surgery and it could take up to 6-8wks for this to heal. I asked the pt if he using the fiber supliments and sitting in a warm sitz baths. The pt said he is using the fiber supliments but not taking a sitz bath b/c he doesn't have a bath tub. I advised the pt that I would notify Dr Michaell Cowing about his call but the pt just needed time for this to heal.

## 2011-06-11 ENCOUNTER — Telehealth (INDEPENDENT_AMBULATORY_CARE_PROVIDER_SITE_OTHER): Payer: Self-pay | Admitting: General Surgery

## 2011-06-11 NOTE — Telephone Encounter (Signed)
Patient called back and got transferred to my voicemail. Please call and check on patient tomorrow.

## 2011-06-11 NOTE — Telephone Encounter (Signed)
Called patient per Dr Michaell Cowing to check and see how he is feeling. No answer. Left message for patient to call back.

## 2011-06-12 ENCOUNTER — Telehealth (INDEPENDENT_AMBULATORY_CARE_PROVIDER_SITE_OTHER): Payer: Self-pay

## 2011-06-12 NOTE — Telephone Encounter (Signed)
Called pt to check on him. The pt is doing ok he has an appt with Dr Michaell Cowing tomorrow. The pt said he is still dealing with constipation but working on the problem. The pt said the gas with leakage has gotten a little better but not gone a way completely.

## 2011-06-13 ENCOUNTER — Ambulatory Visit (INDEPENDENT_AMBULATORY_CARE_PROVIDER_SITE_OTHER): Payer: Medicare Other | Admitting: Surgery

## 2011-06-13 ENCOUNTER — Encounter (INDEPENDENT_AMBULATORY_CARE_PROVIDER_SITE_OTHER): Payer: Self-pay | Admitting: Surgery

## 2011-06-13 VITALS — BP 172/82 | HR 70 | Temp 98.2°F | Resp 18 | Ht 68.0 in | Wt 172.4 lb

## 2011-06-13 DIAGNOSIS — K5909 Other constipation: Secondary | ICD-10-CM | POA: Insufficient documentation

## 2011-06-13 DIAGNOSIS — K648 Other hemorrhoids: Secondary | ICD-10-CM

## 2011-06-13 DIAGNOSIS — K5900 Constipation, unspecified: Secondary | ICD-10-CM

## 2011-06-13 NOTE — Patient Instructions (Signed)

## 2011-06-13 NOTE — Progress Notes (Signed)
Subjective:     Patient ID: Mario Berg, male   DOB: 1941/01/13, 71 y.o.   MRN: [id number]  HPI  Mario Berg  Jul 19, 1940 [id number]  Patient Care Team: Tresa Garter, MD as PCP - General Meryl Dare, MD,FACG as Consulting Physician (Gastroenterology)  This patient is a 71 y.o.male who presents today for surgical evaluation.   Procedure: [facility] hemorrhoidal ligation and pexy March 2013  Reason for visit: Followup  Patient comes today finally feeling better. He struggled with constipation. He did not have a bowel movement for the first 10 days. He weaned himself off narcotics rather rapidly. We recommended clear liquids and trying MiraLAX.  That did not help.  Since he was having flatus, we've recommended he try a mini bowel prep with half a bottle of MiraLAX. He then switched to mag citrate.Marland Kitchen He was a challenge to try and set up a postoperative visit in the office. Eventually he went to the emergency room. He had some disimpaction done. After that he had a lot of diarrhea. Clogged up his toilet with diapers. Plumbing was out for a while. His appetite was down as he felt very bloated. That his work itself out and he started eating better. His sister came in town to help him out as well. He's done to purges with mag citrate. He walks 3-1/2 miles this weekend. He is back to regular activity. He notes that marijuana helps control his pain the best  Patient Active Problem List  Diagnoses  . COLONIC POLYPS  . NEOP, UB, SKIN  . HYPOGONADISM, MALE  . HYPERLIPIDEMIA  . GOUT  . INSOMNIA, TRANSIENT  . HYPERTENSION  . UPPER RESPIRATORY INFECTION (URI)  . DIVERTICULOSIS, COLON  . IBS  . BPH w/o Urinary Obs/LUTS  . OSTEOARTHRITIS, GENERALIZED  . OSTEOARTHRITIS  . ARTHRITIS  . LOW BACK PAIN  . FOOT PAIN  . RASH AND OTHER NONSPECIFIC SKIN ERUPTION  . FREQUENCY, URINARY  . PSA, INCREASED  . SKIN CANCER, HX OF  . TOBACCO USE, QUIT  . CERUMEN IMPACTION  . Shoulder pain  . Internal  hemorrhoids with prolapse & bleeding  . Umbilical hernia  . Chronic constipation    Past Medical History  Diagnosis Date  . Diverticulitis, colon   . Diverticulosis of colon   . Gout   . Hyperlipidemia   . Hypertension     Dr. Melburn Popper  . LBP (low back pain)   . OA (osteoarthritis)   . BPH (benign prostatic hyperplasia)   . IBS (irritable bowel syndrome)     Dr. Russella Dar  C/D  . Hemorrhoids   . Adenomatous polyp 04/2007  . Nasal congestion   . Rectal bleeding   . DIVERTICULITIS OF COLON 05/06/2007  . Gout   . Shortness of breath     [place]  . Umbilical hernia     NO PAIN-BUT PT WORRIES ABOUT IT--WEARS A BACK BRACE FOR SUPPORT AT TIMES    Past Surgical History  Procedure Date  . Skin cancer excision 12-21-2006    Nose  . Thd     05/17/2011  . Hemorrhoid surgery 05/17/2011    Procedure: HEMORRHOIDECTOMY PROLAPSED;  Surgeon: Ardeth Sportsman, MD;  Location: [facility];  Service: General;  Laterality: N/A;  THD Hemorrhoidal Ligation Pexy     History   Social History  . Marital Status: Single    Spouse Name: N/A    Number of Children: N/A  . Years of Education: N/A  Occupational History  . retired     Engineer, civil (consulting)   Social History Main Topics  . Smoking status: Former Smoker    Quit date: 06/03/1988  . Smokeless tobacco: Never Used  . Alcohol Use: No  . Drug Use: Yes    Special: Marijuana     MOST EVERY NIGHT FOR SLEEP  . Sexually Active: Not on file   Other Topics Concern  . Not on file   Social History Narrative  . No narrative on file    Family History  Problem Relation Age of Onset  . Heart failure Mother   . Hypertension Mother   . Hypertension Father   . Heart failure Father   . Colon cancer Neg Hx     Current Outpatient Prescriptions  Medication Sig Dispense Refill  . Calcium Carbonate-Vit D-Min (CALCIUM 1200 PO) Take 0.5 tablets by mouth daily.       . Coenzyme Q10 60 MG TABS Take 1 tablet by mouth daily.       . Diclofenac Sodium 1.5  % SOLN Apply 5 drops topically 4 (four) times daily as needed. Pain       . indomethacin (INDOCIN) 25 MG capsule Take 25 mg by mouth 2 (two) times daily as needed. Gout. Pt will take first before the 50 mg capsule.      . indomethacin (INDOCIN) 50 MG capsule Take 50 mg by mouth 2 (two) times daily as needed. Gout       . lisinopril (PRINIVIL,ZESTRIL) 20 MG tablet Take 20 mg by mouth 2 (two) times daily.      . Multiple Vitamin (MULTIVITAMIN) tablet Take 1 tablet by mouth daily.       . Omega-3 Fatty Acids (FISH OIL) 1200 MG CAPS Take 1 capsule by mouth daily.       Marland Kitchen oxymetazoline (AFRIN) 0.05 % nasal spray Place 2 sprays into the nose 2 (two) times daily.      . Pramox-PE-Glycerin-Petrolatum (PREPARATION H) 1-0.25-14.4-15 % CREA Place 1 application rectally daily.      . psyllium (HYDROCIL/METAMUCIL) 95 % PACK Take 1 packet by mouth daily.         Allergies  Allergen Reactions  . Atorvastatin Other (See Comments)    Diverticulosis per patient  . Furosemide Other (See Comments)    Diverticulosis per patient  . Bystolic (Nebivolol Hcl) Other (See Comments)    Bladder burning  . Clams (Shellfish Allergy) Other (See Comments)    Poison the body   . Clonidine Derivatives Other (See Comments)    weak  . Gemfibrozil Other (See Comments)    Diverticulitis  . Metoprolol Tartrate     REACTION: urinating too much  . Penicillins Hives and Swelling  . Sulfonamide Derivatives Hives and Swelling  . Tekturna (Aliskiren Fumarate) Other (See Comments)    Gout   . Terazosin Hcl     REACTION: ringing in the ears  . Verapamil     REACTION: Wt gain    BP 172/82  Pulse 70  Temp(Src) 98.2 F (36.8 C) (Temporal)  Resp 18  Ht 5\' 8"  (1.727 m)  Wt 172 lb 6 oz (78.189 kg)  BMI 26.21 kg/m2     Review of Systems  Constitutional: Negative for fever, chills and diaphoresis.  HENT: Negative for sore throat, trouble swallowing and neck pain.   Eyes: Negative for photophobia and visual  disturbance.  Respiratory: Negative for choking and shortness of breath.   Cardiovascular: Negative for chest pain and  palpitations.  Gastrointestinal: Positive for constipation and anal bleeding. Negative for nausea, vomiting, abdominal pain, diarrhea, blood in stool, abdominal distention and rectal pain.  Genitourinary: Negative for dysuria, urgency, difficulty urinating and testicular pain.  Musculoskeletal: Negative for myalgias, arthralgias and gait problem.  Skin: Negative for color change and rash.  Neurological: Negative for dizziness, speech difficulty, weakness and numbness.  Hematological: Negative for adenopathy.  Psychiatric/Behavioral: Negative for hallucinations, confusion and agitation.       Objective:   Physical Exam  Constitutional: He is oriented to person, place, and time. He appears well-developed and well-nourished. No distress.  HENT:  Head: Normocephalic.  Mouth/Throat: Oropharynx is clear and moist. No oropharyngeal exudate.  Eyes: Conjunctivae and EOM are normal. Pupils are equal, round, and reactive to light. No scleral icterus.  Neck: Normal range of motion. No tracheal deviation present.  Cardiovascular: Normal rate, normal heart sounds and intact distal pulses.   Pulmonary/Chest: Effort normal. No respiratory distress.  Abdominal: Soft. He exhibits no distension. There is no tenderness. Hernia confirmed negative in the right inguinal area and confirmed negative in the left inguinal area.       Incisions clean with normal healing ridges.  No hernias  Genitourinary:       Perianal skin clean with good hygiene.  No pruritis.  No external skin tags / hemorrhoids of significance.  No pilonidal disease.  No fissure.  No abscess/fistula.    Normal sphincter tone.  Because of the recent surgery and pain, I did not do a digital or anoscopic rectal exam today  Musculoskeletal: Normal range of motion. He exhibits no tenderness.  Neurological: He is alert and oriented  to person, place, and time. No cranial nerve deficit. He exhibits normal muscle tone. Coordination normal.  Skin: Skin is warm and dry. No rash noted. He is not diaphoretic.  Psychiatric: He has a normal mood and affect. His behavior is normal.       Assessment:     One month status post THD hemorrhoidal ligation and pexy complicated by urinary retention and severe initial constipation. Finally improving.    Plan:     Increase activity as tolerated.  Do not push through pain.  Advanced on diet as tolerated. Bowel regimen to avoid problems. He seemed to be hesitant to do anything. I recommended trying flaxseed if the Metamucil so bloating. He did not like to MiraLAX. I noted some people do 30 mL of mag citrate or GoLYTELY as a final option, however, I would not go to this first. He was not interested in pursuing that just yet.  Continue drinking plenty of fluids. Probably up that intake since his stools are small and hard. I expect there will but get better as his solid by mouth intake is improving as well.  Continue walking more to help his bowels be more regular as well.  Return to clinic p.r.n. The patient expressed understanding and appreciation

## 2011-06-27 ENCOUNTER — Other Ambulatory Visit (INDEPENDENT_AMBULATORY_CARE_PROVIDER_SITE_OTHER): Payer: Medicare Other

## 2011-06-27 ENCOUNTER — Other Ambulatory Visit: Payer: Self-pay | Admitting: *Deleted

## 2011-06-27 DIAGNOSIS — I10 Essential (primary) hypertension: Secondary | ICD-10-CM

## 2011-06-27 DIAGNOSIS — N400 Enlarged prostate without lower urinary tract symptoms: Secondary | ICD-10-CM

## 2011-06-27 LAB — BASIC METABOLIC PANEL
BUN: 13 mg/dL (ref 6–23)
Calcium: 9.6 mg/dL (ref 8.4–10.5)
Creatinine, Ser: 0.9 mg/dL (ref 0.4–1.5)
GFR: 86.43 mL/min (ref >60.00)
Glucose, Bld: 134 mg/dL — ABNORMAL HIGH (ref 70–99)

## 2011-06-27 LAB — PSA: PSA: 5.47 ng/mL — ABNORMAL HIGH (ref 0.10–4.00)

## 2011-06-29 ENCOUNTER — Telehealth: Payer: Self-pay | Admitting: Internal Medicine

## 2011-06-29 ENCOUNTER — Ambulatory Visit (INDEPENDENT_AMBULATORY_CARE_PROVIDER_SITE_OTHER): Payer: Medicare Other | Admitting: Internal Medicine

## 2011-06-29 ENCOUNTER — Encounter: Payer: Self-pay | Admitting: Internal Medicine

## 2011-06-29 VITALS — BP 180/100 | HR 80 | Temp 97.7°F | Resp 16 | Wt 176.0 lb

## 2011-06-29 DIAGNOSIS — R972 Elevated prostate specific antigen [PSA]: Secondary | ICD-10-CM

## 2011-06-29 DIAGNOSIS — Z0000 Encounter for general adult medical examination without abnormal findings: Secondary | ICD-10-CM

## 2011-06-29 DIAGNOSIS — N400 Enlarged prostate without lower urinary tract symptoms: Secondary | ICD-10-CM

## 2011-06-29 DIAGNOSIS — M109 Gout, unspecified: Secondary | ICD-10-CM

## 2011-06-29 DIAGNOSIS — E785 Hyperlipidemia, unspecified: Secondary | ICD-10-CM

## 2011-06-29 DIAGNOSIS — R7309 Other abnormal glucose: Secondary | ICD-10-CM

## 2011-06-29 DIAGNOSIS — R739 Hyperglycemia, unspecified: Secondary | ICD-10-CM | POA: Insufficient documentation

## 2011-06-29 DIAGNOSIS — I10 Essential (primary) hypertension: Secondary | ICD-10-CM | POA: Diagnosis not present

## 2011-06-29 MED ORDER — INDOMETHACIN 25 MG PO CAPS: 25.0000 mg | ORAL_CAPSULE | Freq: Two times a day (BID) | ORAL | Status: DC | PRN

## 2011-06-29 MED ORDER — LISINOPRIL 20 MG PO TABS: 20.0000 mg | ORAL_TABLET | Freq: Two times a day (BID) | ORAL | Status: DC

## 2011-06-29 NOTE — Assessment & Plan Note (Signed)
Better than prior

## 2011-06-29 NOTE — Progress Notes (Signed)
Patient ID: Mario Berg, male   DOB: May 13, 1940, 71 y.o.   MRN: [id number]  Subjective:    Patient ID: Mario Berg, male    DOB: 11/17/1940, 71 y.o.   MRN: [id number]  HPI  He had hemorrhoid surgery w/post-op BPH complications - he had a catheter The patient presents for a follow-up of  chronic hypertension, chronic dyslipidemia, OA controlled with medicines sub optimally  BP Readings from Last 3 Encounters:  06/29/11 180/100  06/13/11 172/82  05/26/11 173/76   Wt Readings from Last 3 Encounters:  06/29/11 176 lb (79.833 kg)  06/13/11 172 lb 6 oz (78.189 kg)  05/17/11 183 lb 4 oz (83.122 kg)      Review of Systems  Constitutional: Negative for appetite change, fatigue and unexpected weight change.  HENT: Negative for nosebleeds, congestion, sore throat, sneezing, trouble swallowing and neck pain.   Eyes: Negative for itching and visual disturbance.  Respiratory: Negative for cough.   Cardiovascular: Negative for chest pain, palpitations and leg swelling.  Gastrointestinal: Negative for nausea, diarrhea, blood in stool and abdominal distention.  Genitourinary: Negative for frequency and hematuria.  Musculoskeletal: Negative for back pain, joint swelling and gait problem.  Skin: Negative for rash.  Neurological: Negative for dizziness, tremors, speech difficulty and weakness.  Psychiatric/Behavioral: Negative for sleep disturbance, dysphoric mood and agitation. The patient is not nervous/anxious.        Objective:   Physical Exam  Constitutional: He is oriented to person, place, and time. He appears well-developed.  HENT:  Mouth/Throat: Oropharynx is clear and moist.  Eyes: Conjunctivae are normal. Pupils are equal, round, and reactive to light.  Neck: Normal range of motion. No JVD present. No thyromegaly present.  Cardiovascular: Normal rate, regular rhythm, normal heart sounds and intact distal pulses.  Exam reveals no gallop and no friction rub.   No murmur  heard. Pulmonary/Chest: Effort normal and breath sounds normal. No respiratory distress. He has no wheezes. He has no rales. He exhibits no tenderness.  Abdominal: Soft. Bowel sounds are normal. He exhibits no distension and no mass. There is no tenderness. There is no rebound and no guarding.  Musculoskeletal: Normal range of motion. He exhibits no edema and no tenderness.  Lymphadenopathy:    He has no cervical adenopathy.  Neurological: He is alert and oriented to person, place, and time. He has normal reflexes. No cranial nerve deficit. He exhibits normal muscle tone. Coordination normal.  Skin: Skin is warm and dry. No rash noted.  Psychiatric: He has a normal mood and affect. His behavior is normal. Judgment and thought content normal.   Lab Results  Component Value Date   WBC 15.6* 05/26/2011   HGB 15.5 05/26/2011   HCT 43.1 05/26/2011   PLT 244 05/26/2011   GLUCOSE 134* 06/27/2011   CHOL 224* 03/02/2010   TRIG 285.0* 03/02/2010   HDL 29.20* 03/02/2010   LDLDIRECT 152.3 03/02/2010   ALT 29 03/02/2010   AST 32 03/02/2010   NA 142 06/27/2011   K 5.0 06/27/2011   CL 107 06/27/2011   CREATININE 0.9 06/27/2011   BUN 13 06/27/2011   CO2 28 06/27/2011   TSH 2.30 03/02/2010   PSA 5.47* 06/27/2011   HGBA1C 5.6 11/28/2009          Assessment & Plan:

## 2011-06-29 NOTE — Telephone Encounter (Signed)
Mr. Mario Berg scheduled his Medicare Wellness for Nov. 1.  He wanted to let Dr. Posey Rea know he will do the labs after the appointment to be sure Medicare will pay for them.

## 2011-06-29 NOTE — Telephone Encounter (Signed)
Ok Thx 

## 2011-06-29 NOTE — Assessment & Plan Note (Signed)
Chronic. Multiple drugs intolerance. The best we can do.

## 2011-06-29 NOTE — Assessment & Plan Note (Signed)
Declined statins. On Fish oil and diet  

## 2011-06-29 NOTE — Assessment & Plan Note (Signed)
Will watch labs 

## 2011-06-29 NOTE — Assessment & Plan Note (Signed)
Doing well 

## 2011-06-29 NOTE — Assessment & Plan Note (Signed)
He had to be catheterized  In 2013 several times

## 2011-07-06 ENCOUNTER — Telehealth: Payer: Self-pay | Admitting: Internal Medicine

## 2011-07-06 MED ORDER — LISINOPRIL 20 MG PO TABS: 20.0000 mg | ORAL_TABLET | Freq: Two times a day (BID) | ORAL | Status: DC

## 2011-07-06 NOTE — Telephone Encounter (Signed)
Done

## 2011-07-06 NOTE — Telephone Encounter (Signed)
Pt says he need lisinipril for 180 quanity---[facility] on [address]  Pt [phone] quanity was sent--

## 2011-07-16 ENCOUNTER — Encounter (INDEPENDENT_AMBULATORY_CARE_PROVIDER_SITE_OTHER): Payer: Self-pay | Admitting: Surgery

## 2011-07-16 ENCOUNTER — Ambulatory Visit (INDEPENDENT_AMBULATORY_CARE_PROVIDER_SITE_OTHER): Payer: Medicare Other | Admitting: Surgery

## 2011-07-16 VITALS — BP 156/80 | HR 61 | Temp 98.9°F | Ht 68.0 in | Wt 176.8 lb

## 2011-07-16 DIAGNOSIS — K648 Other hemorrhoids: Secondary | ICD-10-CM

## 2011-07-16 DIAGNOSIS — K5900 Constipation, unspecified: Secondary | ICD-10-CM

## 2011-07-16 DIAGNOSIS — K5909 Other constipation: Secondary | ICD-10-CM

## 2011-07-16 NOTE — Progress Notes (Signed)
Subjective:     Patient ID: Mario Berg, male   DOB: [birth year], 71 y.o.   MRN: [id number]  HPI  Mario Berg  1940/04/12 [id number]  Patient Care Team: Tresa Garter, MD as PCP - General Meryl Dare, MD,FACG as Consulting Physician (Gastroenterology)  This patient is a 71 y.o.male who presents today for surgical evaluation.   Procedure: [facility] hemorrhoidal ligation pexy: 05/17/2011  The patient comes today feeling better but still concerned. He is moving his bowels without 3-4 times a day. Back to Metamucil only. Less issues with gassiness. Notes he has a smaller bowel movement in the morning. Larger one after a walk. Then a few more. No severe constipation nor severe diarrhea. Some occasional fecal urgency.  He spent some time talking about his bowel movements in detail. No bleeding. No particularly hard BMs. He says he is constipated in the morning but what he really means he has a small bowel movement in the morning  Pain down. No bleeding. No fevers or chills. He wanted to check and make sure things are healing up.  Patient Active Problem List  Diagnoses  . COLONIC POLYPS  . NEOP, UB, SKIN  . HYPOGONADISM, MALE  . HYPERLIPIDEMIA  . GOUT  . INSOMNIA, TRANSIENT  . HYPERTENSION  . UPPER RESPIRATORY INFECTION (URI)  . DIVERTICULOSIS, COLON  . IBS  . BPH w/o Urinary Obs/LUTS  . OSTEOARTHRITIS, GENERALIZED  . OSTEOARTHRITIS  . ARTHRITIS  . LOW BACK PAIN  . FOOT PAIN  . RASH AND OTHER NONSPECIFIC SKIN ERUPTION  . FREQUENCY, URINARY  . PSA, INCREASED  . SKIN CANCER, HX OF  . TOBACCO USE, QUIT  . CERUMEN IMPACTION  . Shoulder pain  . Umbilical hernia  . Chronic constipation  . Hyperglycemia    Past Medical History  Diagnosis Date  . Diverticulitis, colon   . Diverticulosis of colon   . Gout   . Hyperlipidemia   . Hypertension     Dr. Melburn Popper  . LBP (low back pain)   . OA (osteoarthritis)   . BPH (benign prostatic hyperplasia)   . IBS (irritable bowel  syndrome)     Dr. Russella Dar  C/D  . Hemorrhoids   . Adenomatous polyp 04/2007  . Nasal congestion   . Rectal bleeding   . DIVERTICULITIS OF COLON 05/06/2007  . Gout   . Shortness of breath     [place]  . Umbilical hernia     NO PAIN-BUT PT WORRIES ABOUT IT--WEARS A BACK BRACE FOR SUPPORT AT TIMES    Past Surgical History  Procedure Date  . Skin cancer excision 12-21-2006    Nose  . Thd     05/17/2011  . Hemorrhoid surgery 05/17/2011    Procedure: HEMORRHOIDECTOMY PROLAPSED;  Surgeon: Ardeth Sportsman, MD;  Location: [facility];  Service: General;  Laterality: N/A;  THD Hemorrhoidal Ligation Pexy   . Hemorrhoid surgery 2013    Dr Michaell Cowing    History   Social History  . Marital Status: Single    Spouse Name: N/A    Number of Children: N/A  . Years of Education: N/A   Occupational History  . retired     Engineer, civil (consulting)   Social History Main Topics  . Smoking status: Former Smoker    Quit date: 06/03/1988  . Smokeless tobacco: Never Used  . Alcohol Use: No  . Drug Use: Yes    Special: Marijuana     MOST EVERY NIGHT FOR  SLEEP  . Sexually Active: Not on file   Other Topics Concern  . Not on file   Social History Narrative  . No narrative on file    Family History  Problem Relation Age of Onset  . Heart failure Mother   . Hypertension Mother   . Hypertension Father   . Heart failure Father   . Colon cancer Neg Hx     Current Outpatient Prescriptions  Medication Sig Dispense Refill  . Calcium Carbonate-Vit D-Min (CALCIUM 1200 PO) Take 0.5 tablets by mouth daily.       . Coenzyme Q10 60 MG TABS Take 1 tablet by mouth daily.       . Diclofenac Sodium 1.5 % SOLN Apply 5 drops topically 4 (four) times daily as needed. Pain       . indomethacin (INDOCIN) 25 MG capsule Take 1 capsule (25 mg total) by mouth 2 (two) times daily as needed. Gout. Pt will take first before the 50 mg capsule.  60 capsule  3  . indomethacin (INDOCIN) 50 MG capsule Take 50 mg by mouth 2 (two)  times daily as needed. Gout       . lisinopril (PRINIVIL,ZESTRIL) 20 MG tablet Take 1 tablet (20 mg total) by mouth 2 (two) times daily.  180 tablet  3  . Multiple Vitamin (MULTIVITAMIN) tablet Take 1 tablet by mouth daily.       . Omega-3 Fatty Acids (FISH OIL) 1200 MG CAPS Take 1 capsule by mouth daily.       Marland Kitchen oxymetazoline (AFRIN) 0.05 % nasal spray Place 2 sprays into the nose 2 (two) times daily.      . Pramox-PE-Glycerin-Petrolatum (PREPARATION H) 1-0.25-14.4-15 % CREA Place 1 application rectally daily.      . psyllium (HYDROCIL/METAMUCIL) 95 % PACK Take 1 packet by mouth daily.         Allergies  Allergen Reactions  . Atorvastatin Other (See Comments)    Diverticulosis per patient  . Furosemide Other (See Comments)    Diverticulosis per patient  . Bystolic (Nebivolol Hcl) Other (See Comments)    Bladder burning  . Clams (Shellfish Allergy) Other (See Comments)    Poison the body   . Clonidine Derivatives Other (See Comments)    weak  . Gemfibrozil Other (See Comments)    Diverticulitis  . Metoprolol Tartrate     REACTION: urinating too much  . Penicillins Hives and Swelling  . Sulfonamide Derivatives Hives and Swelling  . Tekturna (Aliskiren Fumarate) Other (See Comments)    Gout   . Terazosin Hcl     REACTION: ringing in the ears  . Verapamil     REACTION: Wt gain    BP 156/80  Pulse 61  Temp(Src) 98.9 F (37.2 C) (Temporal)  Ht 5\' 8"  (1.727 m)  Wt 176 lb 12.8 oz (80.196 kg)  BMI 26.88 kg/m2  SpO2 98%  No results found.   Review of Systems  Constitutional: Negative for fever, chills and diaphoresis.  HENT: Negative for sore throat, trouble swallowing and neck pain.   Eyes: Negative for photophobia and visual disturbance.  Respiratory: Negative for choking and shortness of breath.   Cardiovascular: Negative for chest pain and palpitations.       Patient walks 60 minutes for about 1 miles without difficulty.  No exertional chest/neck/shoulder/arm  pain.   Gastrointestinal: Negative for nausea, vomiting, abdominal distention, anal bleeding and rectal pain.       No personal nor family history of  GI/colon cancer, inflammatory bowel disease, irritable bowel syndrome, allergy such as Celiac Sprue, dietary/dairy problems, colitis, ulcers nor gastritis.    No recent sick contacts/gastroenteritis.  No travel outside the country.  No changes in diet.   Genitourinary: Negative for dysuria, urgency, difficulty urinating and testicular pain.  Musculoskeletal: Negative for myalgias, arthralgias and gait problem.  Skin: Negative for color change and rash.  Neurological: Negative for dizziness, speech difficulty, weakness and numbness.  Hematological: Negative for adenopathy.  Psychiatric/Behavioral: Negative for hallucinations, confusion and agitation.       Objective:   Physical Exam  Constitutional: He is oriented to person, place, and time. He appears well-developed and well-nourished. No distress.  HENT:  Head: Normocephalic.  Mouth/Throat: Oropharynx is clear and moist. No oropharyngeal exudate.  Eyes: Conjunctivae and EOM are normal. Pupils are equal, round, and reactive to light. No scleral icterus.  Neck: Normal range of motion. No tracheal deviation present.  Cardiovascular: Normal rate, normal heart sounds and intact distal pulses.   Pulmonary/Chest: Effort normal. No respiratory distress.  Abdominal: Soft. He exhibits no distension. There is no tenderness. Hernia confirmed negative in the right inguinal area and confirmed negative in the left inguinal area.       Incisions clean with normal healing ridges.  No hernias  Genitourinary:       Perianal skin clean with good hygiene.  No pruritis.  No external skin tags / hemorrhoids of significance.  No pilonidal disease.  No fissure.  No abscess/fistula.    Tolerates digital rectal exam.  Normal sphincter tone.   No rectal masses.  Hemorrhoidal piles WNL   Musculoskeletal: Normal  range of motion. He exhibits no tenderness.  Neurological: He is alert and oriented to person, place, and time. No cranial nerve deficit. He exhibits normal muscle tone. Coordination normal.  Skin: Skin is warm and dry. No rash noted. He is not diaphoretic.  Psychiatric: He has a normal mood and affect. His behavior is normal.       Assessment:     2 months s/p THD for hemorrhoidal prolapse/bleeding, improving    Plan:     Increase activity as tolerated.  Do not push through pain.  Advanced on diet as tolerated. Bowel regimen to avoid problems.  I noted it takes 3 if not 6 months for the bowel to recalibrate after this. I suspect he could cut down on his Metamucil just a little bit. However, I hesitate any major changes on him since he gets into trouble. I noted he probably have some urgency and discomfort and feels like he does not adequately evacuate for the first few months. However, the rectum should stretch up to a more normal caliber. Does not seem to tight or abnormal to me on exam. He feels reassured. I think he has an expectation of having perfect bowel function. I cautioned him against one a day as the only success point. 3 times a day to every other day BM is still acceptable. He feels reassured for the most part  Return to clinic p.r.n. The patient expressed understanding and appreciation

## 2011-07-16 NOTE — Patient Instructions (Signed)

## 2011-09-24 DIAGNOSIS — H2510 Age-related nuclear cataract, unspecified eye: Secondary | ICD-10-CM | POA: Diagnosis not present

## 2011-09-24 DIAGNOSIS — H26019 Infantile and juvenile cortical, lamellar, or zonular cataract, unspecified eye: Secondary | ICD-10-CM | POA: Diagnosis not present

## 2011-10-09 DIAGNOSIS — H6120 Impacted cerumen, unspecified ear: Secondary | ICD-10-CM | POA: Diagnosis not present

## 2012-01-04 ENCOUNTER — Ambulatory Visit (INDEPENDENT_AMBULATORY_CARE_PROVIDER_SITE_OTHER): Payer: Medicare Other | Admitting: Internal Medicine

## 2012-01-04 ENCOUNTER — Encounter: Payer: Self-pay | Admitting: Internal Medicine

## 2012-01-04 VITALS — BP 194/86 | HR 73 | Temp 97.5°F | Resp 16 | Wt 174.0 lb

## 2012-01-04 DIAGNOSIS — I10 Essential (primary) hypertension: Secondary | ICD-10-CM | POA: Diagnosis not present

## 2012-01-04 DIAGNOSIS — M545 Low back pain, unspecified: Secondary | ICD-10-CM

## 2012-01-04 DIAGNOSIS — Z0000 Encounter for general adult medical examination without abnormal findings: Secondary | ICD-10-CM

## 2012-01-04 DIAGNOSIS — E291 Testicular hypofunction: Secondary | ICD-10-CM | POA: Diagnosis not present

## 2012-01-04 DIAGNOSIS — N400 Enlarged prostate without lower urinary tract symptoms: Secondary | ICD-10-CM

## 2012-01-04 MED ORDER — MIRABEGRON ER 25 MG PO TB24: 25.0000 mg | ORAL_TABLET | Freq: Every day | ORAL | Status: DC

## 2012-01-04 NOTE — Assessment & Plan Note (Signed)
OA related, MSK

## 2012-01-04 NOTE — Assessment & Plan Note (Signed)
He declined testosterone Rx

## 2012-01-04 NOTE — Progress Notes (Signed)
   Subjective:    Patient ID: Mario Berg, male    DOB: September 29, 1940, 71 y.o.   MRN: [id number]  HPI The patient is here for a wellness exam. The patient has been doing well overall without major physical or psychological issues going on lately.  The patient presents for a follow-up of  chronic hypertension, chronic dyslipidemia, OA controlled with medicines sub-optimally. BP is ok at home.  Wt Readings from Last 3 Encounters:  01/04/12 174 lb (78.926 kg)  07/16/11 176 lb 12.8 oz (80.196 kg)  06/29/11 176 lb (79.833 kg)   BP Readings from Last 3 Encounters:  01/04/12 194/86  07/16/11 156/80  06/29/11 180/100        Review of Systems  Constitutional: Negative for appetite change, fatigue and unexpected weight change.  HENT: Negative for nosebleeds, congestion, sore throat, sneezing, trouble swallowing and neck pain.   Eyes: Negative for itching and visual disturbance.  Respiratory: Negative for cough.   Cardiovascular: Negative for chest pain, palpitations and leg swelling.  Gastrointestinal: Negative for nausea, diarrhea, blood in stool and abdominal distention.  Genitourinary: Negative for frequency and hematuria.  Musculoskeletal: Negative for back pain, joint swelling and gait problem.  Skin: Negative for rash.  Neurological: Negative for dizziness, tremors, speech difficulty and weakness.  Psychiatric/Behavioral: Negative for disturbed wake/sleep cycle, dysphoric mood and agitation. The patient is not nervous/anxious.        Objective:   Physical Exam  Constitutional: He is oriented to person, place, and time. He appears well-developed.  HENT:  Mouth/Throat: Oropharynx is clear and moist.  Eyes: Conjunctivae normal are normal. Pupils are equal, round, and reactive to light.  Neck: Normal range of motion. No JVD present. No thyromegaly present.  Cardiovascular: Normal rate, regular rhythm, normal heart sounds and intact distal pulses.  Exam reveals no gallop and no  friction rub.   No murmur heard. Pulmonary/Chest: Effort normal and breath sounds normal. No respiratory distress. He has no wheezes. He has no rales. He exhibits no tenderness.  Abdominal: Soft. Bowel sounds are normal. He exhibits no distension and no mass. There is no tenderness. There is no rebound and no guarding.  Musculoskeletal: Normal range of motion. He exhibits no edema and no tenderness.  Lymphadenopathy:    He has no cervical adenopathy.  Neurological: He is alert and oriented to person, place, and time. He has normal reflexes. No cranial nerve deficit. He exhibits normal muscle tone. Coordination normal.  Skin: Skin is warm and dry. No rash noted.  Psychiatric: He has a normal mood and affect. His behavior is normal. Judgment and thought content normal.   Lab Results  Component Value Date   WBC 15.6* 05/26/2011   HGB 15.5 05/26/2011   HCT 43.1 05/26/2011   PLT 244 05/26/2011   GLUCOSE 134* 06/27/2011   CHOL 224* 03/02/2010   TRIG 285.0* 03/02/2010   HDL 29.20* 03/02/2010   LDLDIRECT 152.3 03/02/2010   ALT 29 03/02/2010   AST 32 03/02/2010   NA 142 06/27/2011   K 5.0 06/27/2011   CL 107 06/27/2011   CREATININE 0.9 06/27/2011   BUN 13 06/27/2011   CO2 28 06/27/2011   TSH 2.30 03/02/2010   PSA 5.47* 06/27/2011   HGBA1C 5.6 11/28/2009          Assessment & Plan:

## 2012-01-04 NOTE — Assessment & Plan Note (Signed)
myrbetriq qd

## 2012-01-04 NOTE — Assessment & Plan Note (Signed)
Continue with current prescription therapy as reflected on the Med list.  

## 2012-01-04 NOTE — Assessment & Plan Note (Signed)

## 2012-01-14 ENCOUNTER — Other Ambulatory Visit (INDEPENDENT_AMBULATORY_CARE_PROVIDER_SITE_OTHER): Payer: Medicare Other

## 2012-01-14 DIAGNOSIS — M545 Low back pain: Secondary | ICD-10-CM

## 2012-01-14 DIAGNOSIS — N400 Enlarged prostate without lower urinary tract symptoms: Secondary | ICD-10-CM

## 2012-01-14 DIAGNOSIS — Z0000 Encounter for general adult medical examination without abnormal findings: Secondary | ICD-10-CM | POA: Diagnosis not present

## 2012-01-14 DIAGNOSIS — E291 Testicular hypofunction: Secondary | ICD-10-CM

## 2012-01-14 DIAGNOSIS — I10 Essential (primary) hypertension: Secondary | ICD-10-CM

## 2012-01-14 LAB — HEPATIC FUNCTION PANEL: Total Bilirubin: 1.1 mg/dL (ref 0.3–1.2)

## 2012-01-14 LAB — BASIC METABOLIC PANEL
CO2: 28 mEq/L (ref 19–32)
Calcium: 9.4 mg/dL (ref 8.4–10.5)
Sodium: 142 mEq/L (ref 135–145)

## 2012-01-14 LAB — CBC WITH DIFFERENTIAL/PLATELET
Basophils Absolute: 0 10*3/uL (ref 0.0–0.1)
Eosinophils Absolute: 0.4 10*3/uL (ref 0.0–0.7)
HCT: 47.3 % (ref 39.0–52.0)
Lymphocytes Relative: 16.5 % (ref 12.0–46.0)
Lymphs Abs: 1.6 10*3/uL (ref 0.7–4.0)
MCHC: 33.4 g/dL (ref 30.0–36.0)
Monocytes Relative: 8.3 % (ref 3.0–12.0)
Platelets: 212 10*3/uL (ref 150.0–400.0)
RDW: 13.5 % (ref 11.5–14.6)

## 2012-01-14 LAB — LIPID PANEL
Cholesterol: 210 mg/dL — ABNORMAL HIGH (ref 0–200)
Total CHOL/HDL Ratio: 6
Triglycerides: 154 mg/dL — ABNORMAL HIGH (ref 0.0–149.0)
VLDL: 30.8 mg/dL (ref 0.0–40.0)

## 2012-01-14 LAB — URINALYSIS, ROUTINE W REFLEX MICROSCOPIC
Bilirubin Urine: NEGATIVE
Leukocytes, UA: NEGATIVE
Nitrite: NEGATIVE
Specific Gravity, Urine: 1.01 (ref 1.000–1.030)
Total Protein, Urine: NEGATIVE
pH: 7 (ref 5.0–8.0)

## 2012-01-14 LAB — PSA: PSA: 7.26 ng/mL — ABNORMAL HIGH (ref 0.10–4.00)

## 2012-01-14 LAB — LDL CHOLESTEROL, DIRECT: Direct LDL: 151.9 mg/dL

## 2012-01-15 ENCOUNTER — Telehealth: Payer: Self-pay | Admitting: Internal Medicine

## 2012-01-15 DIAGNOSIS — R972 Elevated prostate specific antigen [PSA]: Secondary | ICD-10-CM

## 2012-01-15 DIAGNOSIS — I10 Essential (primary) hypertension: Secondary | ICD-10-CM

## 2012-01-15 NOTE — Telephone Encounter (Signed)
Pt informed/ labs entered for 3 month. Copies mailed.

## 2012-01-15 NOTE — Telephone Encounter (Signed)
Mario Berg, please, inform patient that all labs are normal except for elev PSA BMET, free PSA in 3 mo  Please, mail the labs to the patient.    Thx

## 2012-03-24 ENCOUNTER — Encounter: Payer: Self-pay | Admitting: Gastroenterology

## 2012-04-28 ENCOUNTER — Other Ambulatory Visit (INDEPENDENT_AMBULATORY_CARE_PROVIDER_SITE_OTHER): Payer: Medicare Other

## 2012-04-28 DIAGNOSIS — I10 Essential (primary) hypertension: Secondary | ICD-10-CM

## 2012-04-28 LAB — BASIC METABOLIC PANEL
BUN: 9 mg/dL (ref 6–23)
Calcium: 9.5 mg/dL (ref 8.4–10.5)
Chloride: 107 mEq/L (ref 96–112)
Creatinine, Ser: 1 mg/dL (ref 0.4–1.5)
GFR: 78.31 mL/min (ref >60.00)

## 2012-04-28 LAB — PSA, TOTAL AND FREE
PSA, Free: 0.94 ng/mL
PSA: 2.98 ng/mL (ref <=4.00)

## 2012-07-04 ENCOUNTER — Ambulatory Visit: Payer: Medicare Other | Admitting: Internal Medicine

## 2012-07-18 ENCOUNTER — Encounter: Payer: Self-pay | Admitting: Internal Medicine

## 2012-07-18 ENCOUNTER — Ambulatory Visit (INDEPENDENT_AMBULATORY_CARE_PROVIDER_SITE_OTHER): Payer: Medicare Other | Admitting: Internal Medicine

## 2012-07-18 ENCOUNTER — Other Ambulatory Visit (INDEPENDENT_AMBULATORY_CARE_PROVIDER_SITE_OTHER): Payer: Medicare Other

## 2012-07-18 VITALS — BP 192/100 | HR 80 | Temp 97.5°F | Resp 16 | Wt 174.0 lb

## 2012-07-18 DIAGNOSIS — I10 Essential (primary) hypertension: Secondary | ICD-10-CM

## 2012-07-18 DIAGNOSIS — R972 Elevated prostate specific antigen [PSA]: Secondary | ICD-10-CM

## 2012-07-18 DIAGNOSIS — M129 Arthropathy, unspecified: Secondary | ICD-10-CM | POA: Diagnosis not present

## 2012-07-18 DIAGNOSIS — M109 Gout, unspecified: Secondary | ICD-10-CM | POA: Diagnosis not present

## 2012-07-18 LAB — BASIC METABOLIC PANEL
CO2: 30 mEq/L (ref 19–32)
GFR: 90.71 mL/min (ref >60.00)
Glucose, Bld: 100 mg/dL — ABNORMAL HIGH (ref 70–99)
Potassium: 4.4 mEq/L (ref 3.5–5.1)
Sodium: 139 mEq/L (ref 135–145)

## 2012-07-18 MED ORDER — HYDROCODONE-ACETAMINOPHEN 5-325 MG PO TABS: 1.0000 | ORAL_TABLET | Freq: Two times a day (BID) | ORAL | Status: DC | PRN

## 2012-07-18 NOTE — Assessment & Plan Note (Signed)
Continue with current prescription therapy as reflected on the Med list.  

## 2012-07-18 NOTE — Progress Notes (Signed)
   Subjective:    HPI  He had hemorrhoid surgery w/post-op BPH complications The patient presents for a follow-up of  chronic hypertension, chronic dyslipidemia, OA controlled with medicines sub optimally. Constipation is better w/more ater consumption  BP Readings from Last 3 Encounters:  07/18/12 192/100  01/04/12 194/86  07/16/11 156/80   Wt Readings from Last 3 Encounters:  07/18/12 174 lb (78.926 kg)  01/04/12 174 lb (78.926 kg)  07/16/11 176 lb 12.8 oz (80.196 kg)      Review of Systems  Constitutional: Negative for appetite change, fatigue and unexpected weight change.  HENT: Negative for nosebleeds, congestion, sore throat, sneezing, trouble swallowing and neck pain.   Eyes: Negative for itching and visual disturbance.  Respiratory: Negative for cough.   Cardiovascular: Negative for chest pain, palpitations and leg swelling.  Gastrointestinal: Negative for nausea, diarrhea, blood in stool and abdominal distention.  Genitourinary: Negative for frequency and hematuria.  Musculoskeletal: Negative for back pain, joint swelling and gait problem.  Skin: Negative for rash.  Neurological: Negative for dizziness, tremors, speech difficulty and weakness.  Psychiatric/Behavioral: Negative for sleep disturbance, dysphoric mood and agitation. The patient is not nervous/anxious.        Objective:   Physical Exam  Constitutional: He is oriented to person, place, and time. He appears well-developed.  HENT:  Mouth/Throat: Oropharynx is clear and moist.  Eyes: Conjunctivae are normal. Pupils are equal, round, and reactive to light.  Neck: Normal range of motion. No JVD present. No thyromegaly present.  Cardiovascular: Normal rate, regular rhythm, normal heart sounds and intact distal pulses.  Exam reveals no gallop and no friction rub.   No murmur heard. Pulmonary/Chest: Effort normal and breath sounds normal. No respiratory distress. He has no wheezes. He has no rales. He exhibits  no tenderness.  Abdominal: Soft. Bowel sounds are normal. He exhibits no distension and no mass. There is no tenderness. There is no rebound and no guarding.  Musculoskeletal: Normal range of motion. He exhibits no edema and no tenderness.  Lymphadenopathy:    He has no cervical adenopathy.  Neurological: He is alert and oriented to person, place, and time. He has normal reflexes. No cranial nerve deficit. He exhibits normal muscle tone. Coordination normal.  Skin: Skin is warm and dry. No rash noted.  Psychiatric: He has a normal mood and affect. His behavior is normal. Judgment and thought content normal.   Lab Results  Component Value Date   WBC 9.4 01/14/2012   HGB 15.8 01/14/2012   HCT 47.3 01/14/2012   PLT 212.0 01/14/2012   GLUCOSE 112* 04/28/2012   CHOL 210* 01/14/2012   TRIG 154.0* 01/14/2012   HDL 33.30* 01/14/2012   LDLDIRECT 151.9 01/14/2012   ALT 13 01/14/2012   AST 20 01/14/2012   NA 143 04/28/2012   K 4.9 04/28/2012   CL 107 04/28/2012   CREATININE 1.0 04/28/2012   BUN 9 04/28/2012   CO2 28 04/28/2012   TSH 1.54 01/14/2012   PSA 2.98 04/28/2012   HGBA1C 5.6 11/28/2009          Assessment & Plan:

## 2012-07-18 NOTE — Assessment & Plan Note (Signed)
Doing well 

## 2012-07-18 NOTE — Assessment & Plan Note (Signed)
Rare

## 2012-07-18 NOTE — Assessment & Plan Note (Signed)
Watching PSA 

## 2012-07-21 ENCOUNTER — Other Ambulatory Visit: Payer: Self-pay | Admitting: Internal Medicine

## 2012-12-11 ENCOUNTER — Encounter: Payer: Self-pay | Admitting: Gastroenterology

## 2013-01-01 DIAGNOSIS — H6120 Impacted cerumen, unspecified ear: Secondary | ICD-10-CM | POA: Diagnosis not present

## 2013-01-01 DIAGNOSIS — H903 Sensorineural hearing loss, bilateral: Secondary | ICD-10-CM | POA: Diagnosis not present

## 2013-01-01 DIAGNOSIS — H9319 Tinnitus, unspecified ear: Secondary | ICD-10-CM | POA: Diagnosis not present

## 2013-01-01 DIAGNOSIS — H833X9 Noise effects on inner ear, unspecified ear: Secondary | ICD-10-CM | POA: Diagnosis not present

## 2013-01-23 ENCOUNTER — Ambulatory Visit: Payer: Medicare Other | Admitting: Internal Medicine

## 2013-07-03 ENCOUNTER — Encounter: Payer: Self-pay | Admitting: Internal Medicine

## 2013-07-03 ENCOUNTER — Other Ambulatory Visit (INDEPENDENT_AMBULATORY_CARE_PROVIDER_SITE_OTHER): Payer: Medicare Other

## 2013-07-03 ENCOUNTER — Ambulatory Visit (INDEPENDENT_AMBULATORY_CARE_PROVIDER_SITE_OTHER): Payer: Medicare Other | Admitting: Internal Medicine

## 2013-07-03 VITALS — BP 200/92 | HR 80 | Temp 97.6°F | Resp 16 | Wt 173.0 lb

## 2013-07-03 DIAGNOSIS — K5900 Constipation, unspecified: Secondary | ICD-10-CM | POA: Diagnosis not present

## 2013-07-03 DIAGNOSIS — N400 Enlarged prostate without lower urinary tract symptoms: Secondary | ICD-10-CM | POA: Diagnosis not present

## 2013-07-03 DIAGNOSIS — I10 Essential (primary) hypertension: Secondary | ICD-10-CM | POA: Diagnosis not present

## 2013-07-03 DIAGNOSIS — K589 Irritable bowel syndrome without diarrhea: Secondary | ICD-10-CM | POA: Insufficient documentation

## 2013-07-03 DIAGNOSIS — L570 Actinic keratosis: Secondary | ICD-10-CM

## 2013-07-03 DIAGNOSIS — R350 Frequency of micturition: Secondary | ICD-10-CM | POA: Diagnosis not present

## 2013-07-03 DIAGNOSIS — M109 Gout, unspecified: Secondary | ICD-10-CM

## 2013-07-03 DIAGNOSIS — K5909 Other constipation: Secondary | ICD-10-CM

## 2013-07-03 LAB — BASIC METABOLIC PANEL
BUN: 12 mg/dL (ref 6–23)
CALCIUM: 9.7 mg/dL (ref 8.4–10.5)
CO2: 28 mEq/L (ref 19–32)
Chloride: 102 mEq/L (ref 96–112)
Creatinine, Ser: 0.8 mg/dL (ref 0.4–1.5)
GFR: 96.78 mL/min (ref >60.00)
GLUCOSE: 99 mg/dL (ref 70–99)
Potassium: 4.3 mEq/L (ref 3.5–5.1)
SODIUM: 138 meq/L (ref 135–145)

## 2013-07-03 LAB — CBC WITH DIFFERENTIAL/PLATELET
BASOS ABS: 0 10*3/uL (ref 0.0–0.1)
BASOS PCT: 0.4 % (ref 0.0–3.0)
Eosinophils Absolute: 0.2 10*3/uL (ref 0.0–0.7)
Eosinophils Relative: 2.2 % (ref 0.0–5.0)
HEMATOCRIT: 47.3 % (ref 39.0–52.0)
Hemoglobin: 16.1 g/dL (ref 13.0–17.0)
LYMPHS PCT: 14.3 % (ref 12.0–46.0)
Lymphs Abs: 1.3 10*3/uL (ref 0.7–4.0)
MCHC: 34.1 g/dL (ref 30.0–36.0)
MCV: 91.4 fl (ref 78.0–100.0)
MONOS PCT: 8.7 % (ref 3.0–12.0)
Monocytes Absolute: 0.8 10*3/uL (ref 0.1–1.0)
Neutro Abs: 6.7 10*3/uL (ref 1.4–7.7)
Neutrophils Relative %: 74.4 % (ref 43.0–77.0)
Platelets: 231 10*3/uL (ref 150.0–400.0)
RBC: 5.18 Mil/uL (ref 4.22–5.81)
RDW: 13 % (ref 11.5–14.6)
WBC: 9 10*3/uL (ref 4.5–10.5)

## 2013-07-03 LAB — HEPATIC FUNCTION PANEL
ALT: 15 U/L (ref 0–53)
AST: 33 U/L (ref 0–37)
Albumin: 4.1 g/dL (ref 3.5–5.2)
Alkaline Phosphatase: 121 U/L — ABNORMAL HIGH (ref 39–117)
BILIRUBIN TOTAL: 1.1 mg/dL (ref 0.3–1.2)
Bilirubin, Direct: 0.2 mg/dL (ref 0.0–0.3)
TOTAL PROTEIN: 7.1 g/dL (ref 6.0–8.3)

## 2013-07-03 LAB — LIPID PANEL
Cholesterol: 248 mg/dL — ABNORMAL HIGH (ref 0–200)
HDL: 33.1 mg/dL — AB (ref >39.00)
LDL Cholesterol: 142 mg/dL — ABNORMAL HIGH (ref 0–99)
Total CHOL/HDL Ratio: 7
Triglycerides: 363 mg/dL — ABNORMAL HIGH (ref 0.0–149.0)
VLDL: 72.6 mg/dL — AB (ref 0.0–40.0)

## 2013-07-03 LAB — TSH: TSH: 1.41 u[IU]/mL (ref 0.35–5.50)

## 2013-07-03 LAB — PSA: PSA: 3.46 ng/mL (ref 0.10–4.00)

## 2013-07-03 MED ORDER — LISINOPRIL 20 MG PO TABS: ORAL_TABLET | ORAL | Status: DC

## 2013-07-03 MED ORDER — INDOMETHACIN 25 MG PO CAPS: 25.0000 mg | ORAL_CAPSULE | Freq: Two times a day (BID) | ORAL | Status: DC | PRN

## 2013-07-03 NOTE — Assessment & Plan Note (Signed)
Chronic BPH/overactive bladder

## 2013-07-03 NOTE — Assessment & Plan Note (Signed)
Doing ok.

## 2013-07-03 NOTE — Assessment & Plan Note (Signed)
Face - several S/p Derm consult Pt ha declined treatments (5FU cream, cryo)

## 2013-07-03 NOTE — Assessment & Plan Note (Signed)
Chronic. Multiple drugs intolerance. The best we can do. Continue with current prescription therapy as reflected on the Med list.

## 2013-07-03 NOTE — Progress Notes (Signed)
Pre visit review using our clinic review tool, if applicable. No additional management support is needed unless otherwise documented below in the visit note.,  

## 2013-07-03 NOTE — Assessment & Plan Note (Signed)
Resolved

## 2013-07-03 NOTE — Assessment & Plan Note (Signed)
Stable

## 2013-07-06 LAB — URINALYSIS
Bilirubin Urine: NEGATIVE
HGB URINE DIPSTICK: NEGATIVE
Ketones, ur: NEGATIVE
LEUKOCYTES UA: NEGATIVE
Nitrite: NEGATIVE
SPECIFIC GRAVITY, URINE: 1.01 (ref 1.000–1.030)
Total Protein, Urine: NEGATIVE
URINE GLUCOSE: NEGATIVE
Urobilinogen, UA: 0.2 (ref 0.0–1.0)
pH: 7.5 (ref 5.0–8.0)

## 2013-07-07 NOTE — Progress Notes (Signed)
   Subjective:    HPI  He had hemorrhoid surgery w/post-op BPH complications The patient presents for a follow-up of  chronic hypertension, chronic dyslipidemia, OA controlled with medicines sub optimally (Mario Berg is not interested in any med changes). Constipation is better w/more ater consumption  BP Readings from Last 3 Encounters:  07/03/13 200/92  07/18/12 192/100  01/04/12 194/86   Wt Readings from Last 3 Encounters:  07/03/13 173 lb (78.472 kg)  07/18/12 174 lb (78.926 kg)  01/04/12 174 lb (78.926 kg)      Review of Systems  Constitutional: Negative for appetite change, fatigue and unexpected weight change.  HENT: Negative for congestion, nosebleeds, sneezing, sore throat and trouble swallowing.   Eyes: Negative for itching and visual disturbance.  Respiratory: Negative for cough.   Cardiovascular: Negative for chest pain, palpitations and leg swelling.  Gastrointestinal: Negative for nausea, diarrhea, blood in stool and abdominal distention.  Genitourinary: Negative for frequency and hematuria.  Musculoskeletal: Negative for back pain, gait problem, joint swelling and neck pain.  Skin: Negative for rash.  Neurological: Negative for dizziness, tremors, speech difficulty and weakness.  Psychiatric/Behavioral: Negative for sleep disturbance, dysphoric mood and agitation. The patient is not nervous/anxious.        Objective:   Physical Exam  Constitutional: He is oriented to person, place, and time. He appears well-developed.  HENT:  Mouth/Throat: Oropharynx is clear and moist.  Eyes: Conjunctivae are normal. Pupils are equal, round, and reactive to light.  Neck: Normal range of motion. No JVD present. No thyromegaly present.  Cardiovascular: Normal rate, regular rhythm, normal heart sounds and intact distal pulses.  Exam reveals no gallop and no friction rub.   No murmur heard. Pulmonary/Chest: Effort normal and breath sounds normal. No respiratory distress. He has  no wheezes. He has no rales. He exhibits no tenderness.  Abdominal: Soft. Bowel sounds are normal. He exhibits no distension and no mass. There is no tenderness. There is no rebound and no guarding.  Musculoskeletal: Normal range of motion. He exhibits no edema and no tenderness.  Lymphadenopathy:    He has no cervical adenopathy.  Neurological: He is alert and oriented to person, place, and time. He has normal reflexes. No cranial nerve deficit. He exhibits normal muscle tone. Coordination normal.  Skin: Skin is warm and dry. No rash noted.  Psychiatric: He has a normal mood and affect. His behavior is normal. Judgment and thought content normal.   Lab Results  Component Value Date   WBC 9.0 07/03/2013   HGB 16.1 07/03/2013   HCT 47.3 07/03/2013   PLT 231.0 07/03/2013   GLUCOSE 99 07/03/2013   CHOL 248* 07/03/2013   TRIG 363.0* 07/03/2013   HDL 33.10* 07/03/2013   LDLDIRECT 151.9 01/14/2012   LDLCALC 142* 07/03/2013   ALT 15 07/03/2013   AST 33 07/03/2013   NA 138 07/03/2013   K 4.3 07/03/2013   CL 102 07/03/2013   CREATININE 0.8 07/03/2013   BUN 12 07/03/2013   CO2 28 07/03/2013   TSH 1.41 07/03/2013   PSA 3.46 07/03/2013   HGBA1C 5.6 11/28/2009          Assessment & Plan:

## 2013-11-27 IMAGING — CR DG CHEST 2V
2 series · 2 of 2 positions shown · non-contrast
Comparison: None.

CLINICAL DATA: Preop for hemorrhoid surgery

CHEST - 2 VIEW

[w chest pa]
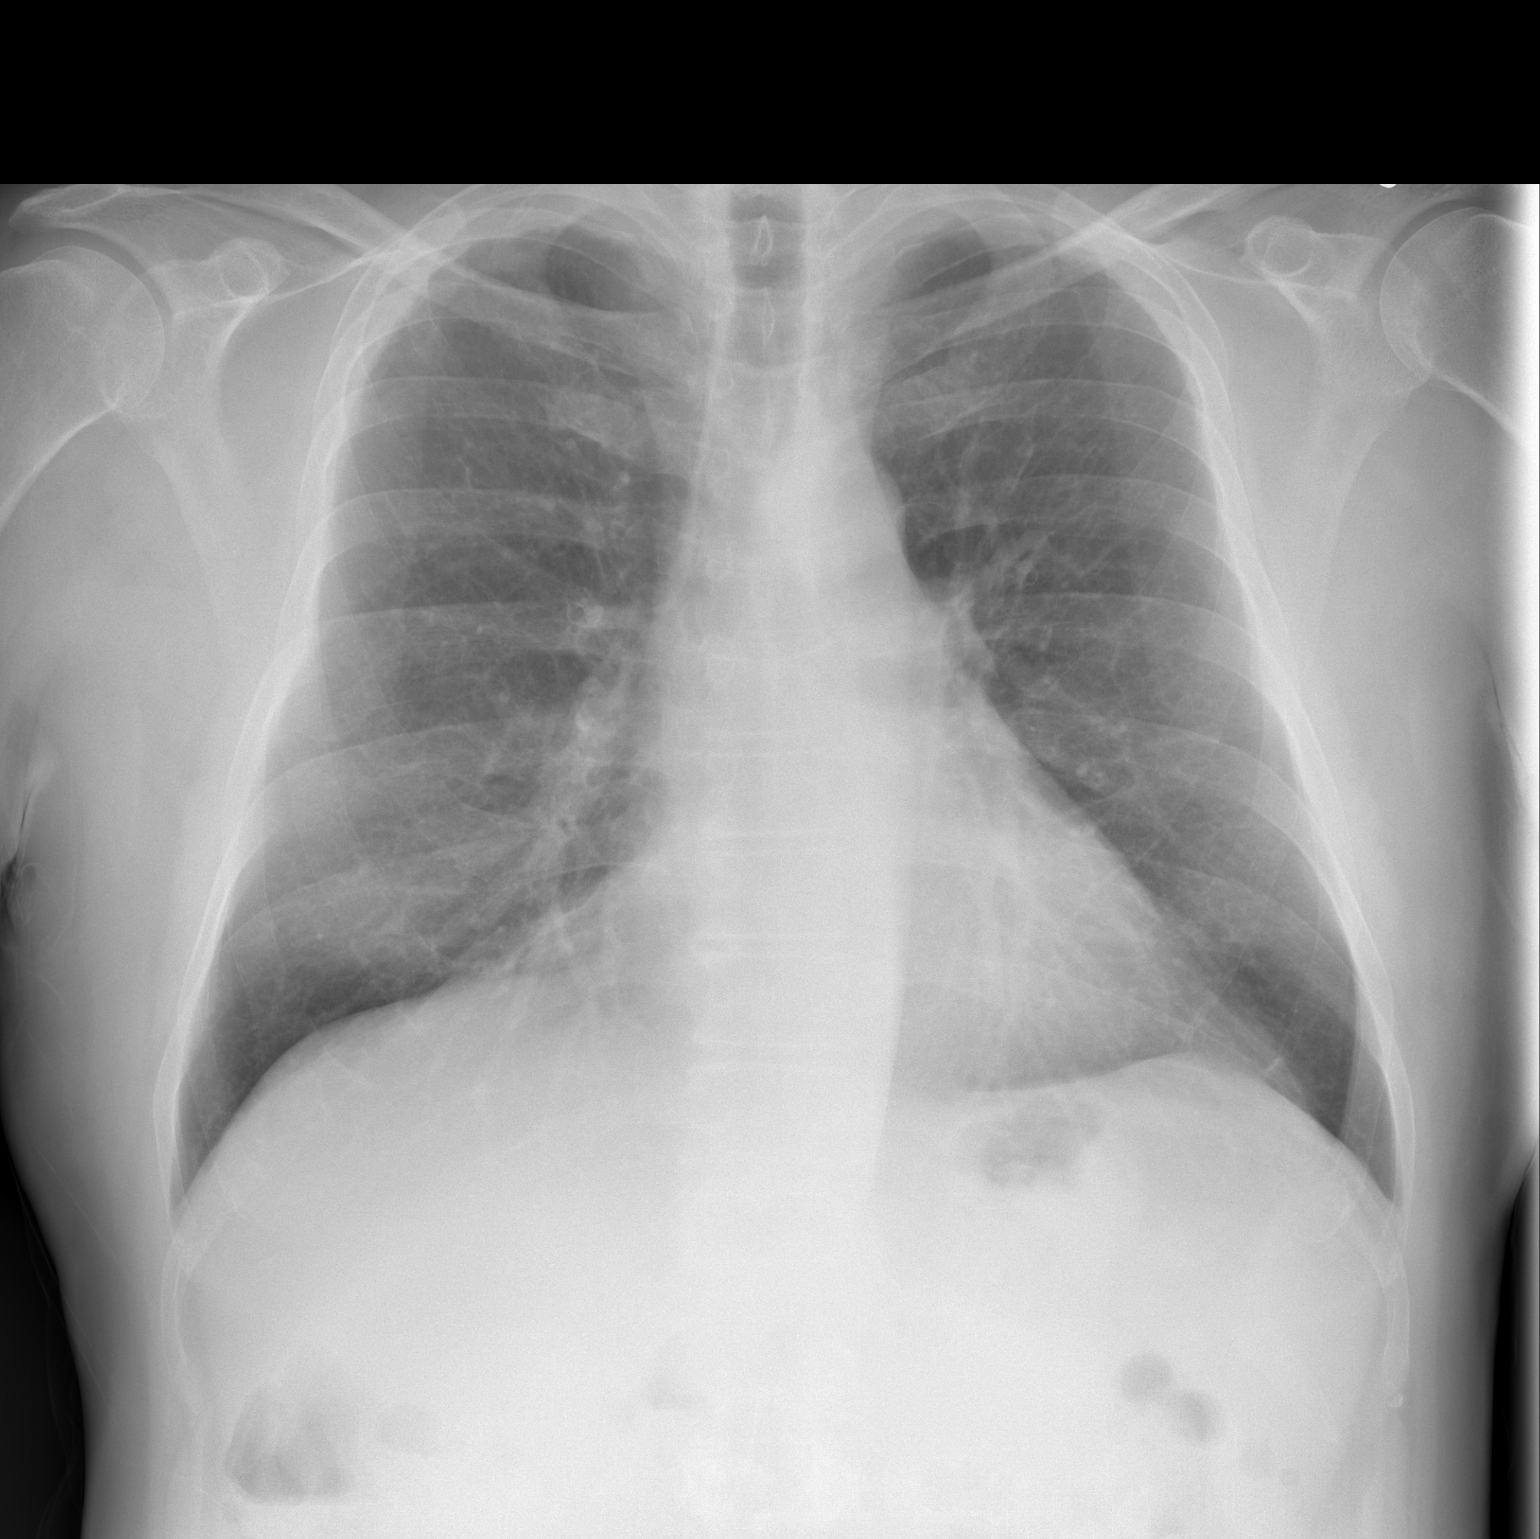

[w chest lat]
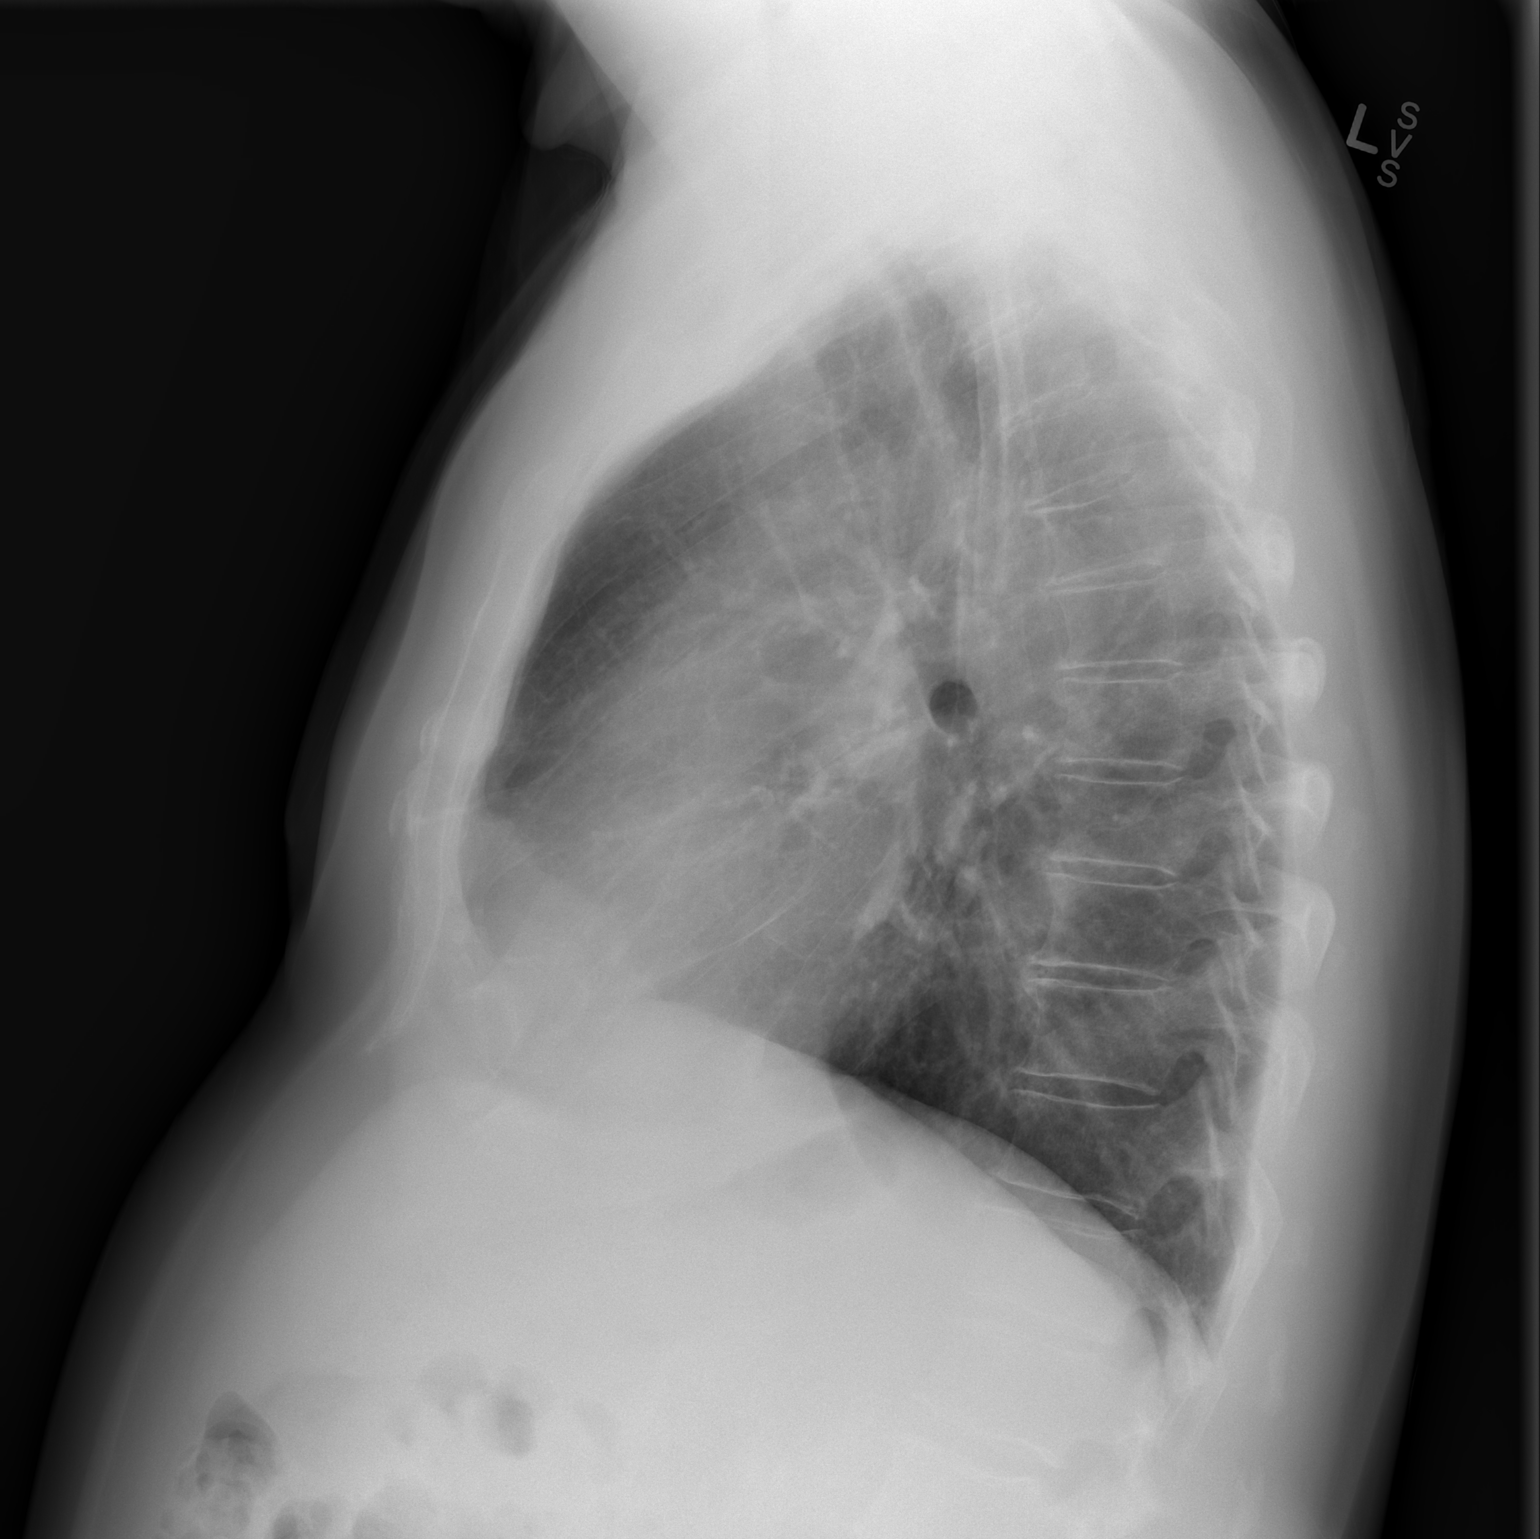

[2 of 2 positions shown; findings below may reference images not displayed]

FINDINGS: No active infiltrate or effusion is seen.  Mild biapical
pleural thickening is present.  Mediastinal contours appear normal.
The heart is mildly enlarged.  No bony abnormality is seen
IMPRESSION: No active lung disease.  Mild biapical pleural thickening.

## 2014-01-03 IMAGING — CR DG ABDOMEN 2V
3 series · 3 of 3 positions shown · non-contrast
Comparison: 12/06/2004 CT.

CLINICAL DATA: Evaluate stool burden.  Post hemorrhoids surgery.
Unable to void.

ABDOMEN - 2 VIEW

[w abdomen decub]
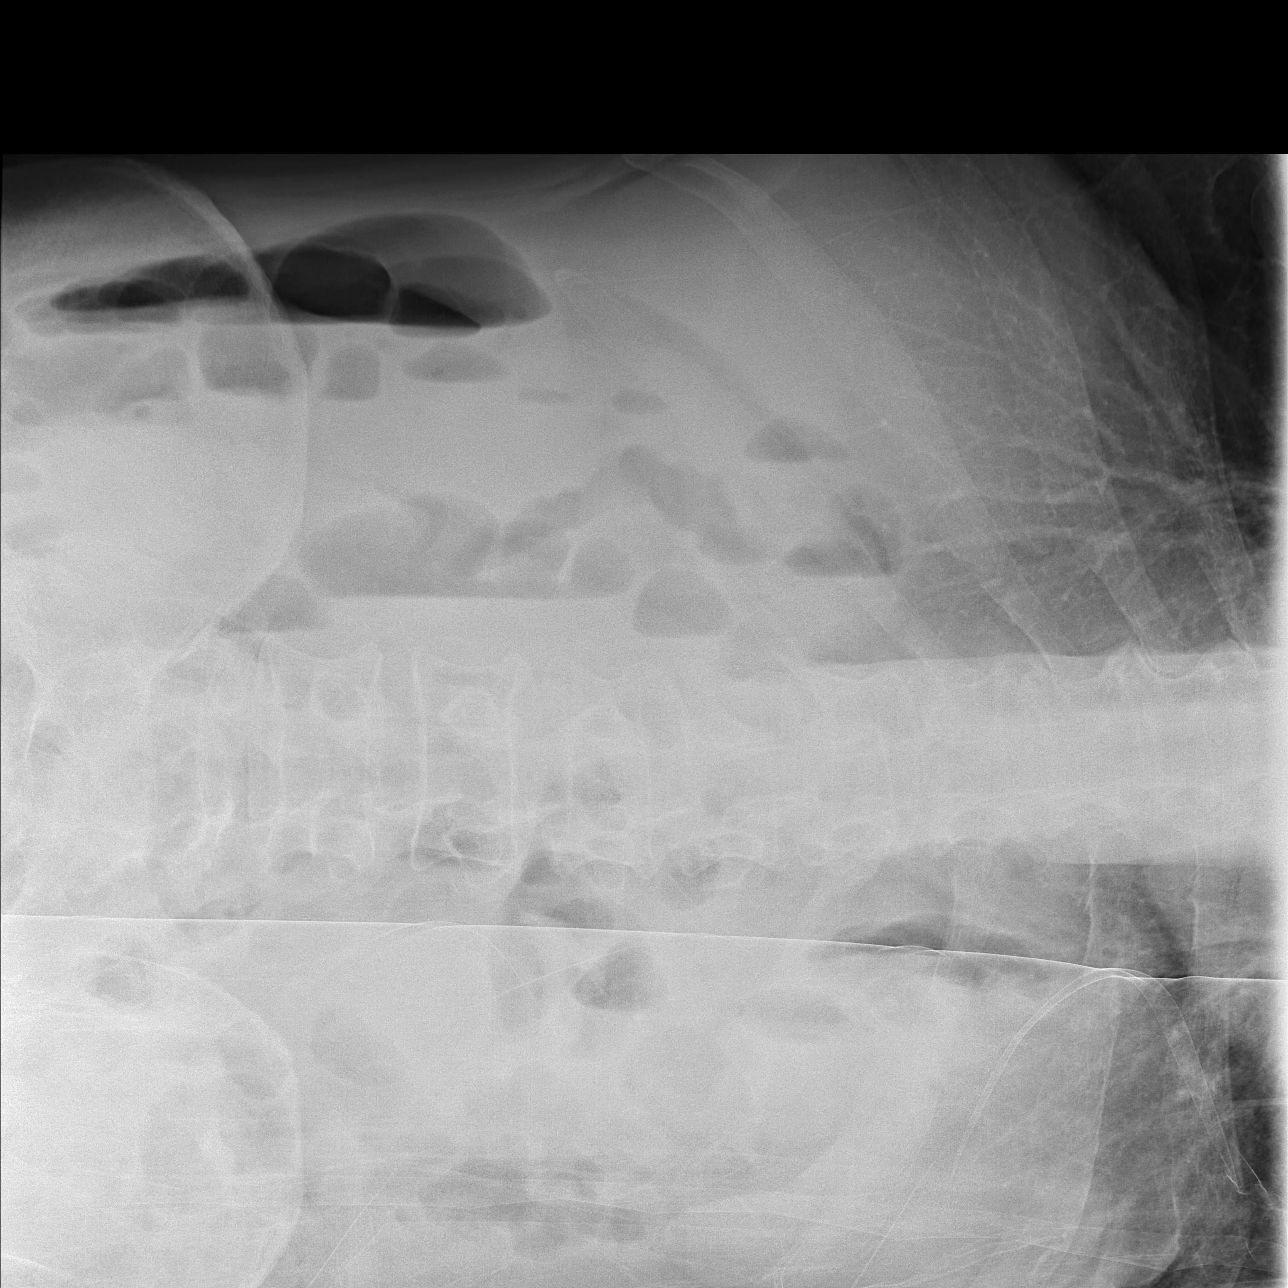

[t abdomen supine (1 of 2)]
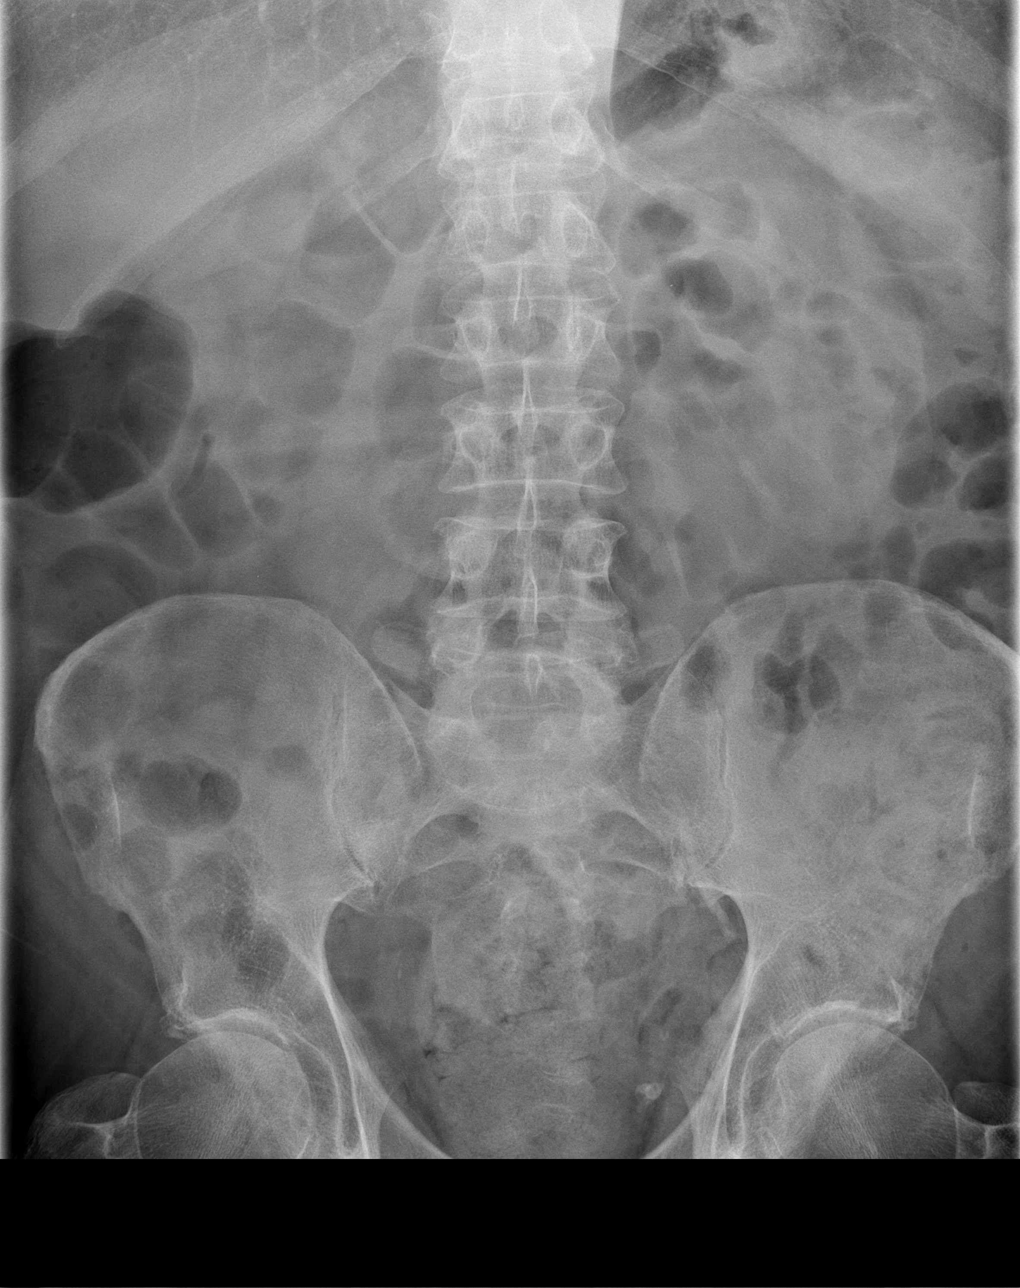

[t abdomen supine (2 of 2)]
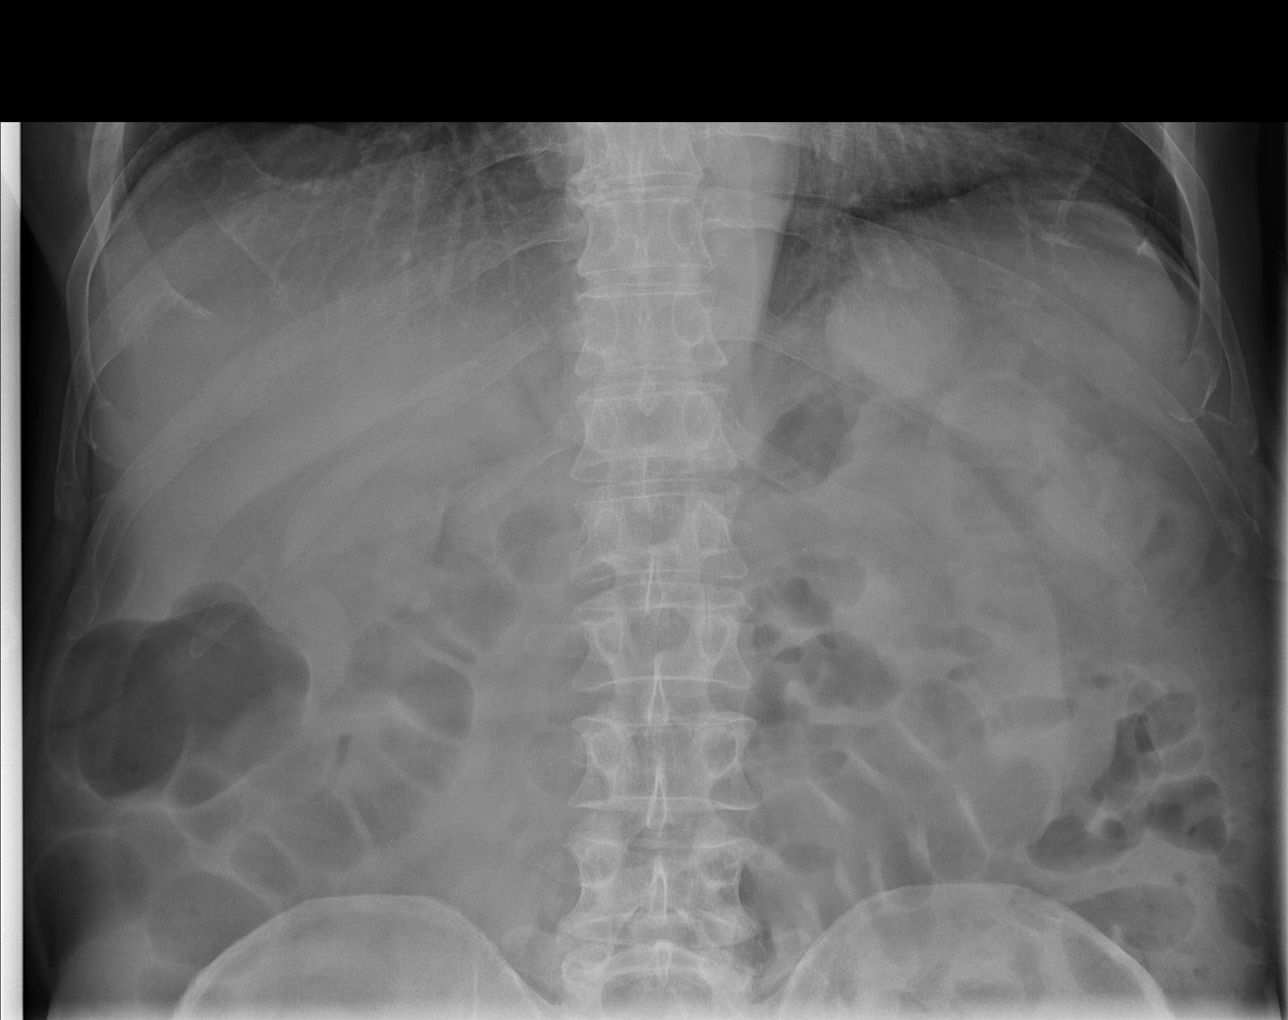

[3 of 3 positions shown; findings below may reference images not displayed]

FINDINGS: Moderate amount of stool rectosigmoid region.  Gas filled
nondistended colon and small bowel loops otherwise noted.  No free
intraperitoneal air.
IMPRESSION: Moderate amount of stool rectosigmoid region.

## 2014-01-08 ENCOUNTER — Encounter: Payer: Medicare Other | Admitting: Internal Medicine

## 2014-04-30 ENCOUNTER — Encounter: Payer: Self-pay | Admitting: Family

## 2014-04-30 ENCOUNTER — Ambulatory Visit (INDEPENDENT_AMBULATORY_CARE_PROVIDER_SITE_OTHER): Payer: Medicare Other | Admitting: Family

## 2014-04-30 VITALS — BP 190/100 | HR 70 | Temp 97.8°F | Wt 164.0 lb

## 2014-04-30 DIAGNOSIS — R05 Cough: Secondary | ICD-10-CM | POA: Diagnosis not present

## 2014-04-30 DIAGNOSIS — R059 Cough, unspecified: Secondary | ICD-10-CM | POA: Insufficient documentation

## 2014-04-30 DIAGNOSIS — R634 Abnormal weight loss: Secondary | ICD-10-CM | POA: Diagnosis not present

## 2014-04-30 MED ORDER — LEVOFLOXACIN 500 MG PO TABS: 500.0000 mg | ORAL_TABLET | Freq: Every day | ORAL | Status: DC

## 2014-04-30 NOTE — Assessment & Plan Note (Addendum)
Symptoms and exam consistent with potential bronchitis or exacerbation of chronic bronchitis. Start levaquin. Continue to drink plenty of fluids. Continue mucinex as needed. Follow up if symptoms worsen or fail to improve.

## 2014-04-30 NOTE — Patient Instructions (Addendum)
Thank you for choosing [facility].  Summary/Instructions:  Your prescription(s) have been submitted to your pharmacy or been printed and provided for you. Please take as directed and contact our office if you believe you are having problem(s) with the medication(s) or have any questions.  If your symptoms worsen or fail to improve, please contact our office for further instruction, or in case of emergency go directly to the emergency room at the closest medical facility.   General Recommendations:    Please drink plenty of fluids.  Get plenty of rest   Sleep in humidified air  Use saline nasal sprays  Netti pot   OTC Medications:  Decongestants - helps relieve congestion   Flonase (generic fluticasone) or Nasacort (generic triamcinolone) - please make sure to use the "cross-over" technique at a 45 degree angle towards the opposite eye as opposed to straight up the nasal passageway.   Sudafed (generic pseudoephedrine - Note this is the one that is available behind the pharmacy counter); Products with phenylephrine (-PE) may also be used but is often not as effective as pseudoephedrine.   If you have HIGH BLOOD PRESSURE - Coricidin HBP; AVOID any product that is -D as this contains pseudoephedrine which may increase your blood pressure.  Afrin (oxymetazoline) every 6-8 hours for up to 3 days.   Allergies - helps relieve runny nose, itchy eyes and sneezing   Claritin (generic loratidine), Allegra (fexofenidine), or Zyrtec (generic cyrterizine) for runny nose. These medications should not cause drowsiness.  Note - Benadryl (generic diphenhydramine) may be used however may cause drowsiness  Cough -   Delsym or Robitussin (generic dextromethorphan)  Expectorants - helps loosen mucus to ease removal   Mucinex (generic guaifenesin) as directed on the package.  Headaches / General Aches   Tylenol (generic acetaminophen) - DO NOT EXCEED 3 grams (3,000 mg) in a 24  hour time period  Advil/Motrin (generic ibuprofen)   Sore Throat -   Salt water gargle   Chloraseptic (generic benzocaine) spray or lozenges / Sucrets (generic dyclonine)      

## 2014-04-30 NOTE — Assessment & Plan Note (Signed)
Overall trend of weight appears to be downward. Patient indicates that his weight goes up and down. After further discussion with patient, it sounds as though his caloric intake is under 1000 calories per day. Discussed with patient the importance of eating calories to at least meet metabolic needs. Start frequent calorie dense small meals and consider adding Boost or Ensure as a meal supplement for the extra calories. Follow up if weight does not improve or increase.

## 2014-04-30 NOTE — Progress Notes (Signed)
Subjective:    Patient ID: Mario Berg, male    DOB: 10-May-1940, 74 y.o.   MRN: [id number]  Chief Complaint  Patient presents with  . Cough    x 3 wks   non productive / feels like he is choking at times    HPI:  Mario Berg is a 74 y.o. male who presents today for an acute visit.  This is a new problem. Associated symptoms of non-productive cough and feels like he is choking at times has been going on for about 3 weeks. Indicates he has a chest full of mucus. Has tried mucinex which made him feel better, but did not resolve the symptoms. Indicates that he lost about 20 pounds over the past 4 months.    Wt Readings from Last 3 Encounters:  04/30/14 164 lb (74.39 kg)  07/03/13 173 lb (78.472 kg)  07/18/12 174 lb (78.926 kg)    Allergies  Allergen Reactions  . Atorvastatin Other (See Comments)    Diverticulosis per patient  . Furosemide Other (See Comments)    Diverticulosis per patient  . Bystolic [Nebivolol Hcl] Other (See Comments)    Bladder burning  . Clams [employer] Allergy] Other (See Comments)    Poison the body   . Clonidine Derivatives Other (See Comments)    weak  . Gemfibrozil Other (See Comments)    Diverticulitis  . Metoprolol Tartrate     REACTION: urinating too much  . Penicillins Hives and Swelling  . Sulfonamide Derivatives Hives and Swelling  . Tekturna [Aliskiren Fumarate] Other (See Comments)    Gout   . Terazosin Hcl     REACTION: ringing in the ears  . Verapamil     REACTION: Wt gain    Current Outpatient Prescriptions on File Prior to Visit  Medication Sig Dispense Refill  . Calcium Carbonate-Vit D-Min (CALCIUM 1200 PO) Take 0.5 tablets by mouth daily.     . Coenzyme Q10 60 MG TABS Take 1 tablet by mouth daily.     . Diclofenac Sodium 1.5 % SOLN Apply 5 drops topically 4 (four) times daily as needed. Pain     . HYDROcodone-acetaminophen (NORCO/VICODIN) 5-325 MG per tablet Take 1 tablet by mouth 2 (two) times daily as needed for  pain. 30 tablet 0  . indomethacin (INDOCIN) 25 MG capsule Take 1 capsule (25 mg total) by mouth 2 (two) times daily as needed. Gout. Pt will take first before the 50 mg capsule. 60 capsule 3  . lisinopril (PRINIVIL,ZESTRIL) 20 MG tablet TAKE ONE TABLET BY MOUTH TWICE DAILY 180 tablet 3  . Multiple Vitamin (MULTIVITAMIN) tablet Take 1 tablet by mouth daily.     . Omega-3 Fatty Acids (FISH OIL) 1200 MG CAPS Take 1 capsule by mouth daily.     Marland Kitchen oxymetazoline (AFRIN) 0.05 % nasal spray Place 2 sprays into the nose 2 (two) times daily.    . Pramox-PE-Glycerin-Petrolatum (PREPARATION H) 1-0.25-14.4-15 % CREA Place 1 application rectally daily.    . psyllium (HYDROCIL/METAMUCIL) 95 % PACK Take 1 packet by mouth daily.    . vitamin E 400 UNIT capsule Take 400 Units by mouth daily.     No current facility-administered medications on file prior to visit.    Review of Systems  Constitutional: Positive for fatigue. Negative for fever and chills.  Respiratory: Positive for cough.   Cardiovascular: Negative for chest pain.      Objective:    BP 190/100 mmHg  Pulse 70  Temp(Src)  97.8 F (36.6 C) (Oral)  Wt 164 lb (74.39 kg)  SpO2 96% Nursing note and vital signs reviewed.  Physical Exam  Constitutional: He is oriented to person, place, and time. He appears well-developed and well-nourished. No distress.  HENT:  Right Ear: Hearing, tympanic membrane, external ear and ear canal normal.  Left Ear: Hearing, tympanic membrane, external ear and ear canal normal.  Nose: Nose normal. Right sinus exhibits no maxillary sinus tenderness and no frontal sinus tenderness. Left sinus exhibits no maxillary sinus tenderness and no frontal sinus tenderness.  Mouth/Throat: Uvula is midline, oropharynx is clear and moist and mucous membranes are normal.  Cardiovascular: Normal rate, normal heart sounds and intact distal pulses.  A regularly irregular rhythm present.  Pulmonary/Chest: Effort normal and breath  sounds normal.  Neurological: He is alert and oriented to person, place, and time.  Skin: Skin is warm and dry.  Psychiatric: He has a normal mood and affect. His behavior is normal. Judgment and thought content normal.       Assessment & Plan:

## 2014-05-03 ENCOUNTER — Telehealth: Payer: Self-pay | Admitting: Family

## 2014-05-03 NOTE — Telephone Encounter (Signed)
Patient has reservations about using levofloxacin (LEVAQUIN) 500 MG tablet [[id number]] due to side effect of snapping of achilles tendent. He is requesting an alternative generic.

## 2014-05-04 MED ORDER — AZITHROMYCIN 250 MG PO TABS: ORAL_TABLET | ORAL | Status: DC

## 2014-05-04 NOTE — Telephone Encounter (Signed)
Please inform patient I have sent Azithromycin in place of the levaquin.

## 2014-05-05 NOTE — Telephone Encounter (Signed)
Pt aware.

## 2014-07-09 ENCOUNTER — Ambulatory Visit (INDEPENDENT_AMBULATORY_CARE_PROVIDER_SITE_OTHER): Payer: Medicare Other | Admitting: Internal Medicine

## 2014-07-09 ENCOUNTER — Other Ambulatory Visit (INDEPENDENT_AMBULATORY_CARE_PROVIDER_SITE_OTHER): Payer: Medicare Other

## 2014-07-09 ENCOUNTER — Encounter: Payer: Self-pay | Admitting: Internal Medicine

## 2014-07-09 VITALS — BP 180/90 | HR 68 | Ht 67.0 in | Wt 167.0 lb

## 2014-07-09 DIAGNOSIS — Z0000 Encounter for general adult medical examination without abnormal findings: Secondary | ICD-10-CM

## 2014-07-09 DIAGNOSIS — I10 Essential (primary) hypertension: Secondary | ICD-10-CM

## 2014-07-09 DIAGNOSIS — M1000 Idiopathic gout, unspecified site: Secondary | ICD-10-CM

## 2014-07-09 DIAGNOSIS — N400 Enlarged prostate without lower urinary tract symptoms: Secondary | ICD-10-CM

## 2014-07-09 DIAGNOSIS — C44310 Basal cell carcinoma of skin of unspecified parts of face: Secondary | ICD-10-CM

## 2014-07-09 DIAGNOSIS — R634 Abnormal weight loss: Secondary | ICD-10-CM

## 2014-07-09 LAB — BASIC METABOLIC PANEL
BUN: 9 mg/dL (ref 6–23)
CO2: 30 mEq/L (ref 19–32)
Calcium: 9.5 mg/dL (ref 8.4–10.5)
Chloride: 105 mEq/L (ref 96–112)
Creatinine, Ser: 0.9 mg/dL (ref 0.40–1.50)
GFR: 87.89 mL/min (ref >60.00)
Glucose, Bld: 99 mg/dL (ref 70–99)
Potassium: 4.4 mEq/L (ref 3.5–5.1)
Sodium: 140 mEq/L (ref 135–145)

## 2014-07-09 LAB — CBC WITH DIFFERENTIAL/PLATELET
Basophils Absolute: 0 10*3/uL (ref 0.0–0.1)
Basophils Relative: 0 % (ref 0.0–3.0)
Eosinophils Absolute: 0.2 10*3/uL (ref 0.0–0.7)
Eosinophils Relative: 2.5 % (ref 0.0–5.0)
HCT: 48.7 % (ref 39.0–52.0)
Hemoglobin: 16.9 g/dL (ref 13.0–17.0)
Lymphocytes Relative: 14.3 % (ref 12.0–46.0)
Lymphs Abs: 1.2 10*3/uL (ref 0.7–4.0)
MCHC: 34.7 g/dL (ref 30.0–36.0)
MCV: 90.1 fl (ref 78.0–100.0)
Monocytes Absolute: 0.8 10*3/uL (ref 0.1–1.0)
Monocytes Relative: 8.9 % (ref 3.0–12.0)
Neutro Abs: 6.3 10*3/uL (ref 1.4–7.7)
Neutrophils Relative %: 74.3 % (ref 43.0–77.0)
Platelets: 222 10*3/uL (ref 150.0–400.0)
RBC: 5.41 Mil/uL (ref 4.22–5.81)
RDW: 14 % (ref 11.5–15.5)
WBC: 8.5 10*3/uL (ref 4.0–10.5)

## 2014-07-09 LAB — HEPATIC FUNCTION PANEL
ALT: 11 U/L (ref 0–53)
AST: 19 U/L (ref 0–37)
Albumin: 4.2 g/dL (ref 3.5–5.2)
Alkaline Phosphatase: 103 U/L (ref 39–117)
BILIRUBIN TOTAL: 1 mg/dL (ref 0.2–1.2)
Bilirubin, Direct: 0.2 mg/dL (ref 0.0–0.3)
Total Protein: 7.4 g/dL (ref 6.0–8.3)

## 2014-07-09 LAB — TSH: TSH: 1.2 u[IU]/mL (ref 0.35–4.50)

## 2014-07-09 MED ORDER — INDOMETHACIN 25 MG PO CAPS: 25.0000 mg | ORAL_CAPSULE | Freq: Two times a day (BID) | ORAL | Status: DC | PRN

## 2014-07-09 MED ORDER — LISINOPRIL 20 MG PO TABS: ORAL_TABLET | ORAL | Status: DC

## 2014-07-09 NOTE — Progress Notes (Signed)
   Subjective:    HPI  The patient is here for a wellness exam. The patient has been doing well overall without major new physical or psychological issues going on lately.Marland Kitchen   He had hemorrhoid surgery w/post-op BPH complications The patient presents for a follow-up of  chronic hypertension, chronic dyslipidemia, OA controlled with medicines sub optimally (Mr Karel is not interested in any med changes). Constipation is better w/more ater consumption  BP Readings from Last 3 Encounters:  07/09/14 180/90  04/30/14 190/100  07/03/13 200/92   Wt Readings from Last 3 Encounters:  07/09/14 167 lb (75.751 kg)  04/30/14 164 lb (74.39 kg)  07/03/13 173 lb (78.472 kg)      Review of Systems  Constitutional: Negative for appetite change, fatigue and unexpected weight change.  HENT: Negative for congestion, nosebleeds, sneezing, sore throat and trouble swallowing.   Eyes: Negative for itching and visual disturbance.  Respiratory: Negative for cough.   Cardiovascular: Negative for chest pain, palpitations and leg swelling.  Gastrointestinal: Negative for nausea, diarrhea, blood in stool and abdominal distention.  Genitourinary: Negative for frequency and hematuria.  Musculoskeletal: Negative for back pain, joint swelling, gait problem and neck pain.  Skin: Negative for rash.  Neurological: Negative for dizziness, tremors, speech difficulty and weakness.  Psychiatric/Behavioral: Negative for sleep disturbance, dysphoric mood and agitation. The patient is not nervous/anxious.        Objective:   Physical Exam  Constitutional: He is oriented to person, place, and time. He appears well-developed.  HENT:  Mouth/Throat: Oropharynx is clear and moist.  Eyes: Conjunctivae are normal. Pupils are equal, round, and reactive to light.  Neck: Normal range of motion. No JVD present. No thyromegaly present.  Cardiovascular: Normal rate, regular rhythm, normal heart sounds and intact distal pulses.   Exam reveals no gallop and no friction rub.   No murmur heard. Pulmonary/Chest: Effort normal and breath sounds normal. No respiratory distress. He has no wheezes. He has no rales. He exhibits no tenderness.  Abdominal: Soft. Bowel sounds are normal. He exhibits no distension and no mass. There is no tenderness. There is no rebound and no guarding.  Musculoskeletal: Normal range of motion. He exhibits no edema or tenderness.  Lymphadenopathy:    He has no cervical adenopathy.  Neurological: He is alert and oriented to person, place, and time. He has normal reflexes. No cranial nerve deficit. He exhibits normal muscle tone. Coordination normal.  Skin: Skin is warm and dry. No rash noted.  Psychiatric: He has a normal mood and affect. His behavior is normal. Judgment and thought content normal.  lesions on face c/w skin ca Pt declined rectal exam  Lab Results  Component Value Date   WBC 9.0 07/03/2013   HGB 16.1 07/03/2013   HCT 47.3 07/03/2013   PLT 231.0 07/03/2013   GLUCOSE 99 07/03/2013   CHOL 248* 07/03/2013   TRIG 363.0* 07/03/2013   HDL 33.10* 07/03/2013   LDLDIRECT 151.9 01/14/2012   LDLCALC 142* 07/03/2013   ALT 15 07/03/2013   AST 33 07/03/2013   NA 138 07/03/2013   K 4.3 07/03/2013   CL 102 07/03/2013   CREATININE 0.8 07/03/2013   BUN 12 07/03/2013   CO2 28 07/03/2013   TSH 1.41 07/03/2013   PSA 3.46 07/03/2013   HGBA1C 5.6 11/28/2009          Assessment & Plan:

## 2014-07-09 NOTE — Assessment & Plan Note (Addendum)
Here for medicare wellness/physical  Diet: heart healthy  Physical activity: not sedentary  Depression/mood screen: negative  Hearing: intact to whispered voice  Visual acuity: grossly normal, performs annual eye exam  ADLs: capable  Fall risk: none  Home safety: good  Cognitive evaluation: intact to orientation, naming, recall and repetition  EOL planning: adv directives, full code/ I agree  I have personally reviewed and have noted  1. The patient's medical, surgical and social history  2. Their use of alcohol, tobacco or illicit drugs  3. Their current medications and supplements  4. The patient's functional ability including ADL's, fall risks, home safety risks and hearing or visual impairment.  5. Diet and physical activities  6. Evidence for depression or mood disorders 7. The roster of all physicians providing medical care to patient - is listed in the Snapshot section of the chart and reviewed today.    Today patient counseled on age appropriate routine health concerns for screening and prevention, each reviewed and up to date or declined. Immunizations reviewed and up to date or declined. Labs ordered and reviewed. Risk factors for depression reviewed and negative. Hearing function and visual acuity are intact. ADLs screened and addressed as needed. Functional ability and level of safety reviewed and appropriate. Education, counseling and referrals performed based on assessed risks today. Patient provided with a copy of personalized plan for preventive services.   Pt declined vaccines

## 2014-07-09 NOTE — Patient Instructions (Signed)

## 2014-07-09 NOTE — Progress Notes (Signed)
Pre visit review using our clinic review tool, if applicable. No additional management support is needed unless otherwise documented below in the visit note. 

## 2014-07-09 NOTE — Assessment & Plan Note (Signed)
Chronic sx's Declined Rx

## 2014-07-09 NOTE — Assessment & Plan Note (Signed)
resolved 

## 2014-07-09 NOTE — Assessment & Plan Note (Signed)
2010 R cheek - chronic S/p Derm evaluation Pt refused any treatment

## 2014-07-09 NOTE — Assessment & Plan Note (Signed)
Chronic. Multiple drugs intolerance. The best we can do. On Lisinopril

## 2014-08-03 ENCOUNTER — Encounter: Payer: Self-pay | Admitting: Gastroenterology

## 2015-01-06 ENCOUNTER — Encounter: Payer: Self-pay | Admitting: Internal Medicine

## 2015-01-06 ENCOUNTER — Ambulatory Visit (INDEPENDENT_AMBULATORY_CARE_PROVIDER_SITE_OTHER): Payer: Medicare Other | Admitting: Internal Medicine

## 2015-01-06 VITALS — BP 218/108 | HR 69 | Wt 172.0 lb

## 2015-01-06 DIAGNOSIS — I10 Essential (primary) hypertension: Secondary | ICD-10-CM | POA: Diagnosis not present

## 2015-01-06 DIAGNOSIS — C44310 Basal cell carcinoma of skin of unspecified parts of face: Secondary | ICD-10-CM

## 2015-01-06 NOTE — Progress Notes (Signed)
Pre visit review using our clinic review tool, if applicable. No additional management support is needed unless otherwise documented below in the visit note. 

## 2015-01-06 NOTE — Patient Instructions (Signed)
[facility]  [place], [place] [address]   [phone]   [phone] (fax)  Mon - Fri: 8:00 a.m. to 5:00 p.m. Sat, Sun: Closed

## 2015-01-06 NOTE — Progress Notes (Signed)
Subjective:  Patient ID: Mario Berg, male    DOB: 1940-05-05  Age: 74 y.o. MRN: [id number]  CC: No chief complaint on file.   HPI Mario Berg presents for a L cheek mole bleeding x 1 week after it was hit w/a shaver. Pt recalls having this mole for 6+ months. It has been growing fast lately...  Outpatient Prescriptions Prior to Visit  Medication Sig Dispense Refill  . Calcium Carbonate-Vit D-Min (CALCIUM 1200 PO) Take 0.5 tablets by mouth daily.     . Coenzyme Q10 60 MG TABS Take 1 tablet by mouth daily.     . Diclofenac Sodium 1.5 % SOLN Apply 5 drops topically 4 (four) times daily as needed. Pain     . HYDROcodone-acetaminophen (NORCO/VICODIN) 5-325 MG per tablet Take 1 tablet by mouth 2 (two) times daily as needed for pain. 30 tablet 0  . indomethacin (INDOCIN) 25 MG capsule Take 1 capsule (25 mg total) by mouth 2 (two) times daily as needed. Gout. Pt will take first before the 50 mg capsule. 60 capsule 3  . lisinopril (PRINIVIL,ZESTRIL) 20 MG tablet TAKE ONE TABLET BY MOUTH TWICE DAILY 180 tablet 3  . Multiple Vitamin (MULTIVITAMIN) tablet Take 1 tablet by mouth daily.     . Omega-3 Fatty Acids (FISH OIL) 1200 MG CAPS Take 1 capsule by mouth daily.     Marland Kitchen oxymetazoline (AFRIN) 0.05 % nasal spray Place 2 sprays into the nose 2 (two) times daily.    . Pramox-PE-Glycerin-Petrolatum (PREPARATION H) 1-0.25-14.4-15 % CREA Place 1 application rectally daily.    . psyllium (HYDROCIL/METAMUCIL) 95 % PACK Take 1 packet by mouth daily.    . vitamin E 400 UNIT capsule Take 400 Units by mouth daily.     No facility-administered medications prior to visit.    ROS Review of Systems  Constitutional: Negative for fatigue.  Respiratory: Negative for cough and wheezing.   Cardiovascular: Negative for leg swelling.  Genitourinary: Positive for urgency and frequency.  Skin: Positive for color change and wound. Negative for rash.  Psychiatric/Behavioral: The patient is not nervous/anxious.       Objective:  BP 218/108 mmHg  Pulse 69  Wt 172 lb (78.019 kg)  SpO2 97%  BP Readings from Last 3 Encounters:  01/06/15 218/108  07/09/14 180/90  04/30/14 190/100    Wt Readings from Last 3 Encounters:  01/06/15 172 lb (78.019 kg)  07/09/14 167 lb (75.751 kg)  04/30/14 164 lb (74.39 kg)    Physical Exam  Constitutional: No distress.  Pulmonary/Chest: He has no rales.  Musculoskeletal: He exhibits no tenderness.  Neurological: Coordination normal.  Skin: He is not diaphoretic. There is erythema. No pallor.  Psychiatric: Judgment normal.   L cheek 12x11 mm raw granulating nodule  Lab Results  Component Value Date   WBC 8.5 07/09/2014   HGB 16.9 07/09/2014   HCT 48.7 07/09/2014   PLT 222.0 07/09/2014   GLUCOSE 99 07/09/2014   CHOL 248* 07/03/2013   TRIG 363.0* 07/03/2013   HDL 33.10* 07/03/2013   LDLDIRECT 151.9 01/14/2012   LDLCALC 142* 07/03/2013   ALT 11 07/09/2014   AST 19 07/09/2014   NA 140 07/09/2014   K 4.4 07/09/2014   CL 105 07/09/2014   CREATININE 0.90 07/09/2014   BUN 9 07/09/2014   CO2 30 07/09/2014   TSH 1.20 07/09/2014   PSA 3.46 07/03/2013   HGBA1C 5.6 11/28/2009    Dg Abd 2 Views  05/26/2011  *RADIOLOGY REPORT*  Clinical Data: Evaluate stool burden.  Post hemorrhoids surgery. Unable to void. ABDOMEN - 2 VIEW Comparison: 12/06/2004 CT. Findings: Moderate amount of stool rectosigmoid region.  Gas filled nondistended colon and small bowel loops otherwise noted.  No free intraperitoneal air. IMPRESSION: Moderate amount of stool rectosigmoid region. Original Report Authenticated By: Doug Sou, M.D.   Assessment & Plan:   Diagnoses and all orders for this visit:  Basal cell carcinoma, face -     Ambulatory referral to Dermatology  I am having Mr. Journey maintain his Calcium Carbonate-Vit D-Min (CALCIUM 1200 PO), Coenzyme Q10, multivitamin, Fish Oil, oxymetazoline, Diclofenac Sodium, Pramox-PE-Glycerin-Petrolatum, psyllium, vitamin E,  HYDROcodone-acetaminophen, indomethacin, and lisinopril.  No orders of the defined types were placed in this encounter.     Follow-up: No Follow-up on file.  Walker Kehr, MD

## 2015-01-06 NOTE — Assessment & Plan Note (Addendum)
11/16 L cheek 12x11 mm granulating nodule (?basal cell Ca) - worsening. Pt refused any treatment in the past. He will have a needed treatment now. Will ref to [facility] for treatment

## 2015-01-10 ENCOUNTER — Encounter: Payer: Self-pay | Admitting: Internal Medicine

## 2015-01-10 NOTE — Assessment & Plan Note (Signed)
Chronic. Multiple drugs intolerance. The best we can do. On Lisinopril 

## 2015-01-11 DIAGNOSIS — C44329 Squamous cell carcinoma of skin of other parts of face: Secondary | ICD-10-CM | POA: Diagnosis not present

## 2015-01-11 DIAGNOSIS — D485 Neoplasm of uncertain behavior of skin: Secondary | ICD-10-CM | POA: Diagnosis not present

## 2015-02-14 DIAGNOSIS — H6123 Impacted cerumen, bilateral: Secondary | ICD-10-CM | POA: Diagnosis not present

## 2015-02-14 DIAGNOSIS — H903 Sensorineural hearing loss, bilateral: Secondary | ICD-10-CM | POA: Diagnosis not present

## 2015-02-23 DIAGNOSIS — C44329 Squamous cell carcinoma of skin of other parts of face: Secondary | ICD-10-CM | POA: Diagnosis not present

## 2015-02-23 DIAGNOSIS — C4A39 Merkel cell carcinoma of other parts of face: Secondary | ICD-10-CM | POA: Diagnosis not present

## 2015-02-23 DIAGNOSIS — D485 Neoplasm of uncertain behavior of skin: Secondary | ICD-10-CM | POA: Diagnosis not present

## 2015-03-23 DIAGNOSIS — C4A39 Merkel cell carcinoma of other parts of face: Secondary | ICD-10-CM | POA: Diagnosis not present

## 2015-07-07 ENCOUNTER — Observation Stay (HOSPITAL_COMMUNITY)
Admission: EM | Admit: 2015-07-07 | Discharge: 2015-07-08 | Disposition: A | Discharge status: Home / Self Care | Payer: Medicare Other | Attending: Internal Medicine | Admitting: Internal Medicine

## 2015-07-07 ENCOUNTER — Emergency Department (HOSPITAL_COMMUNITY): Payer: Medicare Other

## 2015-07-07 ENCOUNTER — Encounter (HOSPITAL_COMMUNITY): Payer: Self-pay | Admitting: Emergency Medicine

## 2015-07-07 DIAGNOSIS — M1990 Unspecified osteoarthritis, unspecified site: Secondary | ICD-10-CM | POA: Diagnosis not present

## 2015-07-07 DIAGNOSIS — Z79899 Other long term (current) drug therapy: Secondary | ICD-10-CM | POA: Insufficient documentation

## 2015-07-07 DIAGNOSIS — Z66 Do not resuscitate: Secondary | ICD-10-CM | POA: Diagnosis not present

## 2015-07-07 DIAGNOSIS — M109 Gout, unspecified: Secondary | ICD-10-CM | POA: Insufficient documentation

## 2015-07-07 DIAGNOSIS — I10 Essential (primary) hypertension: Secondary | ICD-10-CM | POA: Insufficient documentation

## 2015-07-07 DIAGNOSIS — Z87891 Personal history of nicotine dependence: Secondary | ICD-10-CM | POA: Insufficient documentation

## 2015-07-07 DIAGNOSIS — E785 Hyperlipidemia, unspecified: Secondary | ICD-10-CM | POA: Diagnosis not present

## 2015-07-07 DIAGNOSIS — R079 Chest pain, unspecified: Principal | ICD-10-CM | POA: Insufficient documentation

## 2015-07-07 LAB — CBC
HEMATOCRIT: 47.1 % (ref 39.0–52.0)
HEMOGLOBIN: 17 g/dL (ref 13.0–17.0)
MCH: 32 pg (ref 26.0–34.0)
MCHC: 36.1 g/dL — ABNORMAL HIGH (ref 30.0–36.0)
MCV: 88.7 fL (ref 78.0–100.0)
Platelets: 173 10*3/uL (ref 150–400)
RBC: 5.31 MIL/uL (ref 4.22–5.81)
RDW: 13 % (ref 11.5–15.5)
WBC: 9.5 10*3/uL (ref 4.0–10.5)

## 2015-07-07 LAB — HEPATIC FUNCTION PANEL
ALBUMIN: 4.4 g/dL (ref 3.5–5.0)
ALK PHOS: 87 U/L (ref 38–126)
ALT: 16 U/L — AB (ref 17–63)
AST: 20 U/L (ref 15–41)
BILIRUBIN TOTAL: 1.5 mg/dL — AB (ref 0.3–1.2)
Bilirubin, Direct: 0.2 mg/dL (ref 0.1–0.5)
Indirect Bilirubin: 1.3 mg/dL — ABNORMAL HIGH (ref 0.3–0.9)
TOTAL PROTEIN: 7.5 g/dL (ref 6.5–8.1)

## 2015-07-07 LAB — BASIC METABOLIC PANEL
ANION GAP: 10 (ref 5–15)
BUN: 14 mg/dL (ref 6–20)
CHLORIDE: 108 mmol/L (ref 101–111)
CO2: 22 mmol/L (ref 22–32)
CREATININE: 0.82 mg/dL (ref 0.61–1.24)
Calcium: 9.3 mg/dL (ref 8.9–10.3)
GFR calc non Af Amer: >60 mL/min (ref >60)
Glucose, Bld: 108 mg/dL — ABNORMAL HIGH (ref 65–99)
POTASSIUM: 3.8 mmol/L (ref 3.5–5.1)
SODIUM: 140 mmol/L (ref 135–145)

## 2015-07-07 LAB — RAPID URINE DRUG SCREEN, HOSP PERFORMED
AMPHETAMINES: NOT DETECTED
BARBITURATES: NOT DETECTED
Benzodiazepines: NOT DETECTED
Cocaine: NOT DETECTED
Opiates: NOT DETECTED
TETRAHYDROCANNABINOL: POSITIVE — AB

## 2015-07-07 LAB — I-STAT TROPONIN, ED: Troponin i, poc: 0 ng/mL (ref 0.00–0.08)

## 2015-07-07 LAB — TROPONIN I: Troponin I: <0.03 ng/mL (ref <0.031)

## 2015-07-07 MED ORDER — VITAMIN E 180 MG (400 UNIT) PO CAPS
400.0000 [IU] | ORAL_CAPSULE | Freq: Every day | ORAL | Status: DC
  Administered 2015-07-08: 400 [IU] via ORAL
  Filled 2015-07-07: qty 1

## 2015-07-07 MED ORDER — POLYVINYL ALCOHOL 1.4 % OP SOLN
1.0000 [drp] | Freq: Every day | OPHTHALMIC | Status: DC
  Administered 2015-07-08: 1 [drp] via OPHTHALMIC
  Filled 2015-07-07: qty 15

## 2015-07-07 MED ORDER — ENOXAPARIN SODIUM 40 MG/0.4ML ~~LOC~~ SOLN
40.0000 mg | SUBCUTANEOUS | Status: DC
  Filled 2015-07-07 (×2): qty 0.4

## 2015-07-07 MED ORDER — ISOSORBIDE MONONITRATE ER 30 MG PO TB24
30.0000 mg | ORAL_TABLET | Freq: Every day | ORAL | Status: DC
  Administered 2015-07-07 – 2015-07-08 (×2): 30 mg via ORAL
  Filled 2015-07-07 (×2): qty 1

## 2015-07-07 MED ORDER — ACETAMINOPHEN 325 MG PO TABS: 650.0000 mg | ORAL_TABLET | ORAL | Status: DC | PRN

## 2015-07-07 MED ORDER — ONDANSETRON HCL 4 MG/2ML IJ SOLN: 4.0000 mg | Freq: Four times a day (QID) | INTRAMUSCULAR | Status: DC | PRN

## 2015-07-07 MED ORDER — CO Q 10 100 MG PO CAPS: 100.0000 mg | ORAL_CAPSULE | Freq: Every day | ORAL | Status: DC

## 2015-07-07 MED ORDER — NITROGLYCERIN 0.4 MG SL SUBL
0.4000 mg | SUBLINGUAL_TABLET | SUBLINGUAL | Status: DC | PRN
  Administered 2015-07-07 – 2015-07-08 (×3): 0.4 mg via SUBLINGUAL
  Filled 2015-07-07 (×3): qty 1

## 2015-07-07 MED ORDER — FAMOTIDINE 20 MG PO TABS
20.0000 mg | ORAL_TABLET | Freq: Two times a day (BID) | ORAL | Status: DC
  Administered 2015-07-08: 20 mg via ORAL
  Filled 2015-07-07 (×3): qty 1

## 2015-07-07 MED ORDER — LISINOPRIL 20 MG PO TABS
20.0000 mg | ORAL_TABLET | Freq: Two times a day (BID) | ORAL | Status: DC
  Administered 2015-07-08: 20 mg via ORAL
  Filled 2015-07-07 (×3): qty 1

## 2015-07-07 MED ORDER — ASPIRIN 81 MG PO CHEW
324.0000 mg | CHEWABLE_TABLET | Freq: Once | ORAL | Status: AC
  Administered 2015-07-07: 324 mg via ORAL
  Filled 2015-07-07: qty 4

## 2015-07-07 MED ORDER — EYE WASH 99.05 % OP SOLN: 1.0000 "application " | Freq: Every day | OPHTHALMIC | Status: DC

## 2015-07-07 NOTE — ED Notes (Signed)
MD at bedside. 

## 2015-07-07 NOTE — Progress Notes (Signed)
PHARMACIST - PHYSICIAN ORDER COMMUNICATION  CONCERNING: P&T Medication Policy on Herbal Medications  DESCRIPTION:  This patient's order for: Coenzyme Q10 has been noted.  This product(s) is classified as an "herbal" or natural product. Due to a lack of definitive safety studies or FDA approval, nonstandard manufacturing practices, plus the potential risk of unknown drug-drug interactions while on inpatient medications, the Pharmacy and Therapeutics Committee does not permit the use of "herbal" or natural products of this type within [facility].   ACTION TAKEN: The pharmacy department is unable to verify this order at this time and your patient has been informed of this safety policy. Please reevaluate patient's clinical condition at discharge and address if the herbal or natural product(s) should be resumed at that time.  Romeo Rabon, PharmD, pager [phone]. 07/07/2015,9:11 PM.

## 2015-07-07 NOTE — H&P (Signed)
History and Physical  SHERRICK SCHAPER [id number] DOB: Aug 04, 1940 DOA: 07/07/2015  Referring physician: EDP PCP: Walker Kehr, MD   Chief Complaint: substernal chest pain, worse in the last month.  HPI: Mario Berg is a 75 y.o. male  With h/o HTN, now well controlled per patient due to multiple blood pressure meds intolerance/allergy. H/o HLD, He presented ED due to above complaints, he reported to EDP his chest pain worse with exertion, he reported to me the chest pain wakes him up in the middle of the night, marijuana give immediate relieve of chest pain. He denies cough, he is a nonsmoker, he reported sedentary life style, he does not walk because he does not like his neighbour's dog. He denies PND, no edema, no fever. In the ED EKG with NSR, no acute ST/T changes, troponin negative, EDP requests patient being admitted for chest pain rule out , cardiology called by EDP.   Review of Systems:  Detail per HPI, Review of systems are otherwise negative  Past Medical History  Diagnosis Date  . Diverticulitis, colon   . Diverticulosis of colon   . Gout   . Hyperlipidemia   . Hypertension     Dr. Cathie Olden  . LBP (low back pain)   . OA (osteoarthritis)   . BPH (benign prostatic hyperplasia)   . IBS (irritable bowel syndrome)     Dr. Fuller Plan  C/D  . Hemorrhoids   . Adenomatous polyp 04/2007  . Nasal congestion   . Rectal bleeding   . DIVERTICULITIS OF COLON 05/06/2007  . Gout   . Shortness of breath     [place]  . Umbilical hernia     NO PAIN-BUT PT WORRIES ABOUT IT--WEARS A BACK BRACE FOR SUPPORT AT TIMES   Past Surgical History  Procedure Laterality Date  . Skin cancer excision  12-21-2006    Nose  . Thd      05/17/2011  . Hemorrhoid surgery  05/17/2011    Procedure: HEMORRHOIDECTOMY PROLAPSED;  Surgeon: Adin Hector, MD;  Location: [facility];  Service: General;  Laterality: N/A;  [place] Hemorrhoidal Ligation Pexy   . Hemorrhoid surgery  2013    Dr [name]   Social  History:  reports that he quit smoking about 27 years ago. He has never used smokeless tobacco. He reports that he uses illicit drugs (Marijuana). He reports that he does not drink alcohol. Patient lives at home by himself & is able to participate in activities of daily living independently   Allergies  Allergen Reactions  . Penicillins Hives and Swelling    Swelling of the hands  Has patient had a PCN reaction causing immediate rash, facial/tongue/throat swelling, SOB or lightheadedness with hypotension: Yes Has patient had a PCN reaction causing severe rash involving mucus membranes or skin necrosis: Yes Has patient had a PCN reaction that required hospitalization No Has patient had a PCN reaction occurring within the last 10 years: No If all of the above answers are "NO", then may proceed with Cephalosporin use.   . Atorvastatin Other (See Comments)    Diverticulosis per patient  . Furosemide Other (See Comments)    Diverticulosis per patient  . Bystolic [Nebivolol Hcl] Other (See Comments)    Bladder burning  . Clams [employer] Allergy] Other (See Comments)    Poison the body   . Clonidine Derivatives Other (See Comments)    weak  . Gemfibrozil Other (See Comments)    Diverticulitis  . Metoprolol Tartrate  REACTION: urinating too much  . Sulfonamide Derivatives Hives and Swelling  . Tekturna [Aliskiren Fumarate] Other (See Comments)    Gout   . Terazosin Hcl     REACTION: ringing in the ears  . Verapamil     REACTION: Wt gain    Family History  Problem Relation Age of Onset  . Heart failure Mother   . Hypertension Mother   . Hypertension Father   . Heart failure Father   . Colon cancer Neg Hx       Prior to Admission medications   Medication Sig Start Date End Date Taking? Authorizing Provider  Calcium Carb-Cholecalciferol (CALCIUM 600 + D PO) Take 1 tablet by mouth daily.   Yes Historical Provider, MD  Coenzyme Q10 (CO Q 10) 100 MG CAPS Take 100 mg by mouth  daily.   Yes Historical Provider, MD  indomethacin (INDOCIN) 25 MG capsule Take 1 capsule (25 mg total) by mouth 2 (two) times daily as needed. Gout. Pt will take first before the 50 mg capsule. Patient taking differently: Take 25 mg by mouth 2 (two) times daily as needed (for gout).  07/09/14  Yes Aleksei [name] V, MD  lisinopril (PRINIVIL,ZESTRIL) 20 MG tablet TAKE ONE TABLET BY MOUTH TWICE DAILY 07/09/14  Yes Aleksei [name] V, MD  Multiple Vitamin (MULTIVITAMIN) tablet Take 1 tablet by mouth daily.    Yes Historical Provider, MD  Omega-3 Fatty Acids (FISH OIL) 1200 MG CAPS Take 1,200 mg by mouth daily.    Yes Historical Provider, MD  Ophthalmic Irrigation Solution (EYE [place]) 99.05 % SOLN Apply 1 application to eye daily.   Yes Historical Provider, MD  OVER THE COUNTER MEDICATION Take 2 tablets by mouth daily. *Blood Pressure Factor*   Yes Historical Provider, MD  oxymetazoline (AFRIN) 0.05 % nasal spray Place 1 spray into both nostrils 2 (two) times daily as needed for congestion.   Yes Historical Provider, MD  vitamin E 400 UNIT capsule Take 400 Units by mouth daily.   Yes Historical Provider, MD  HYDROcodone-acetaminophen (NORCO/VICODIN) 5-325 MG per tablet Take 1 tablet by mouth 2 (two) times daily as needed for pain. Patient not taking: Reported on 07/07/2015 07/18/12   Cassandria Anger, MD    Physical Exam: BP 174/64 mmHg  Pulse 58  Temp(Src) 97.8 F (36.6 C) (Oral)  Resp 19  Ht 5\' 8"  (1.727 m)  Wt 77.066 kg (169 lb 14.4 oz)  BMI 25.84 kg/m2  SpO2 100%  General:  Well developed, NAD Eyes: PERRL ENT: unremarkable Neck: supple, no JVD Cardiovascular: RRR Respiratory: CTABL Abdomen: soft/ND/ND, positive bowel sounds Skin: no rash Musculoskeletal:  No edema Psychiatric: calm/cooperative Neurologic: no focal findings            Labs on Admission:  Basic Metabolic Panel:  Recent Labs Lab 07/07/15 1408  NA 140  K 3.8  CL 108  CO2 22  GLUCOSE 108*  BUN 14    CREATININE 0.82  CALCIUM 9.3   Liver Function Tests:  Recent Labs Lab 07/07/15 1414  AST 20  ALT 16*  ALKPHOS 87  BILITOT 1.5*  PROT 7.5  ALBUMIN 4.4   No results for input(s): LIPASE, AMYLASE in the last 168 hours. No results for input(s): AMMONIA in the last 168 hours. CBC:  Recent Labs Lab 07/07/15 1408  WBC 9.5  HGB 17.0  HCT 47.1  MCV 88.7  PLT 173   Cardiac Enzymes: No results for input(s): CKTOTAL, CKMB, CKMBINDEX, TROPONINI in the last 168  hours.  BNP (last 3 results) No results for input(s): BNP in the last 8760 hours.  ProBNP (last 3 results) No results for input(s): PROBNP in the last 8760 hours.  CBG: No results for input(s): GLUCAP in the last 168 hours.  Radiological Exams on Admission: Dg Chest 2 View  07/07/2015  CLINICAL DATA:  Chest pain EXAM: CHEST  2 VIEW COMPARISON:  04/19/2011 FINDINGS: The heart size and mediastinal contours are within normal limits. Both lungs are clear. The visualized skeletal structures are unremarkable. IMPRESSION: No active cardiopulmonary disease. Electronically Signed   By: Inez Catalina M.D.   On: 07/07/2015 14:04    EKG: Independently reviewed. NRS with a few pac's. No acute st/t changes  Assessment/Plan Present on Admission:  . Chest pain  Chest pain rule out, chest pain with both typical and atypical features, he does has risk factors including long standing uncontrolled HTN, h/o HLD. Will admit to tele , continue cycle troponin, check tsh, fasting lipid panel, keep npo after midnight in case of stress test, cardiology consulted by EDP. Acid reflux? Trial of pepcid  Uncontrolled HTN: patient reported multiple medication allergies /intolerance, reported long standing uncontrolled HTN,. Will try to work with patient to adjust bp meds, start imdur, and prn hydralazine.  HLD, check lipid panel   DVT prophylaxis: lovenox  Consultants: cardiology  Code Status: DNR , confirmed with patient with RN in ED  room  Family Communication:  Patient   Disposition Plan: admit to med tele obs  Time spent: 80mins  Sapphire Tygart MD, PhD [facility] Pager [phone] If 7PM-7AM, please contact night-coverage at [email or web address], password [facility]

## 2015-07-07 NOTE — ED Notes (Signed)
Pt with long term angina radiating to right arm and bilateral neck and jaw x several years c/o worsening anginal episodes worsened with mild exertion and improved with sitting or marijuana worsened June 04, 2015, and increased in frequency to daily this past week. Pt reports now chest pain does not subside unless he smokes marijuana. Pt does not take nitroglycerine, has not been evaluated for chest pain since the late 1990s.

## 2015-07-07 NOTE — ED Provider Notes (Signed)
CSN: [id number]     Arrival date & time 07/07/15  1314 History   First MD Initiated Contact with Patient 07/07/15 1343     Chief Complaint  Patient presents with  . Chest Pain     (Consider location/radiation/quality/duration/timing/severity/associated sxs/prior Treatment) Patient is a 75 y.o. male presenting with chest pain. The history is provided by the patient (Patient states that for the last month and a half he's been having chest pain lasting for a like an hour. Pain is worse with exertion and gets better with resting).  Chest Pain Pain location:  L chest Pain quality: aching   Pain radiates to:  Does not radiate Pain radiates to the back: no   Pain severity:  Moderate Onset quality:  Sudden Timing:  Constant Progression:  Resolved Chronicity:  New Context: not breathing   Relieved by:  Rest Associated symptoms: no abdominal pain, no back pain, no cough, no fatigue and no headache     Past Medical History  Diagnosis Date  . Diverticulitis, colon   . Diverticulosis of colon   . Gout   . Hyperlipidemia   . Hypertension     Dr. Cathie Olden  . LBP (low back pain)   . OA (osteoarthritis)   . BPH (benign prostatic hyperplasia)   . IBS (irritable bowel syndrome)     Dr. Fuller Plan  C/D  . Hemorrhoids   . Adenomatous polyp 04/2007  . Nasal congestion   . Rectal bleeding   . DIVERTICULITIS OF COLON 05/06/2007  . Gout   . Shortness of breath     [place]  . Umbilical hernia     NO PAIN-BUT PT WORRIES ABOUT IT--WEARS A BACK BRACE FOR SUPPORT AT TIMES   Past Surgical History  Procedure Laterality Date  . Skin cancer excision  12-21-2006    Nose  . Thd      05/17/2011  . Hemorrhoid surgery  05/17/2011    Procedure: HEMORRHOIDECTOMY PROLAPSED;  Surgeon: Adin Hector, MD;  Location: [facility];  Service: General;  Laterality: N/A;  [place] Hemorrhoidal Ligation Pexy   . Hemorrhoid surgery  2013    Dr [name]   Family History  Problem Relation Age of Onset  . Heart failure  Mother   . Hypertension Mother   . Hypertension Father   . Heart failure Father   . Colon cancer Neg Hx    Social History  Substance Use Topics  . Smoking status: Former Smoker    Quit date: 06/03/1988  . Smokeless tobacco: Never Used  . Alcohol Use: No    Review of Systems  Constitutional: Negative for appetite change and fatigue.  HENT: Negative for congestion, ear discharge and sinus pressure.   Eyes: Negative for discharge.  Respiratory: Negative for cough.   Cardiovascular: Positive for chest pain.  Gastrointestinal: Negative for abdominal pain and diarrhea.  Genitourinary: Negative for frequency and hematuria.  Musculoskeletal: Negative for back pain.  Skin: Negative for rash.  Neurological: Negative for seizures and headaches.  Psychiatric/Behavioral: Negative for hallucinations.      Allergies  Penicillins; Atorvastatin; Furosemide; Bystolic; Clams; Clonidine derivatives; Gemfibrozil; Metoprolol tartrate; Sulfonamide derivatives; Tekturna; Terazosin hcl; and Verapamil  Home Medications   Prior to Admission medications   Medication Sig Start Date End Date Taking? Authorizing Provider  Calcium Carb-Cholecalciferol (CALCIUM 600 + D PO) Take 1 tablet by mouth daily.   Yes Historical Provider, MD  Coenzyme Q10 (CO Q 10) 100 MG CAPS Take 100 mg by mouth daily.  Yes Historical Provider, MD  indomethacin (INDOCIN) 25 MG capsule Take 1 capsule (25 mg total) by mouth 2 (two) times daily as needed. Gout. Pt will take first before the 50 mg capsule. Patient taking differently: Take 25 mg by mouth 2 (two) times daily as needed (for gout).  07/09/14  Yes Aleksei [name] V, MD  lisinopril (PRINIVIL,ZESTRIL) 20 MG tablet TAKE ONE TABLET BY MOUTH TWICE DAILY 07/09/14  Yes Aleksei [name] V, MD  Multiple Vitamin (MULTIVITAMIN) tablet Take 1 tablet by mouth daily.    Yes Historical Provider, MD  Omega-3 Fatty Acids (FISH OIL) 1200 MG CAPS Take 1,200 mg by mouth daily.    Yes  Historical Provider, MD  Ophthalmic Irrigation Solution (EYE [place]) 99.05 % SOLN Apply 1 application to eye daily.   Yes Historical Provider, MD  OVER THE COUNTER MEDICATION Take 2 tablets by mouth daily. *Blood Pressure Factor*   Yes Historical Provider, MD  oxymetazoline (AFRIN) 0.05 % nasal spray Place 1 spray into both nostrils 2 (two) times daily as needed for congestion.   Yes Historical Provider, MD  vitamin E 400 UNIT capsule Take 400 Units by mouth daily.   Yes Historical Provider, MD  HYDROcodone-acetaminophen (NORCO/VICODIN) 5-325 MG per tablet Take 1 tablet by mouth 2 (two) times daily as needed for pain. Patient not taking: Reported on 07/07/2015 07/18/12   Aleksei [name] V, MD   BP 172/78 mmHg  Pulse 65  Temp(Src) 98.1 F (36.7 C) (Oral)  Resp 19  SpO2 99% Physical Exam  Constitutional: He is oriented to person, place, and time. He appears well-developed.  HENT:  Head: Normocephalic.  Eyes: Conjunctivae and EOM are normal. No scleral icterus.  Neck: Neck supple. No thyromegaly present.  Cardiovascular: Normal rate and regular rhythm.  Exam reveals no gallop and no friction rub.   No murmur heard. Pulmonary/Chest: No stridor. He has no wheezes. He has no rales. He exhibits no tenderness.  Abdominal: He exhibits no distension. There is no tenderness. There is no rebound.  Musculoskeletal: Normal range of motion. He exhibits no edema.  Lymphadenopathy:    He has no cervical adenopathy.  Neurological: He is oriented to person, place, and time. He exhibits normal muscle tone. Coordination normal.  Skin: No rash noted. No erythema.  Psychiatric: He has a normal mood and affect. His behavior is normal.    ED Course  Procedures (including critical care time) Labs Review Labs Reviewed  BASIC METABOLIC PANEL - Abnormal; Notable for the following:    Glucose, Bld 108 (*)    All other components within normal limits  CBC - Abnormal; Notable for the following:    MCHC 36.1 (*)     All other components within normal limits  HEPATIC FUNCTION PANEL - Abnormal; Notable for the following:    ALT 16 (*)    Total Bilirubin 1.5 (*)    Indirect Bilirubin 1.3 (*)    All other components within normal limits  I-STAT TROPOININ, ED    Imaging Review Dg Chest 2 View  07/07/2015  CLINICAL DATA:  Chest pain EXAM: CHEST  2 VIEW COMPARISON:  04/19/2011 FINDINGS: The heart size and mediastinal contours are within normal limits. Both lungs are clear. The visualized skeletal structures are unremarkable. IMPRESSION: No active cardiopulmonary disease. Electronically Signed   By: Inez Catalina M.D.   On: 07/07/2015 14:04   I have personally reviewed and evaluated these images and lab results as part of my medical decision-making.   EKG Interpretation  Date/Time:  Thursday Jul 07 2015 13:26:23 EDT Ventricular Rate:  69 PR Interval:  157 QRS Duration: 94 QT Interval:  413 QTC Calculation: 442 R Axis:   47 Text Interpretation:  Sinus rhythm Supraventricular bigeminy Probable left  atrial enlargement Confirmed by Nikodem Leadbetter  MD, Raquon Milledge [phone]) on 07/07/2015  1:52:15 PM      MDM   Final diagnoses:  Chest pain at rest    Patient will be admitted for further workup of chest pain. Cardiology will consult in the morning. Patient will remain nothing by mouth after midnight in case he has a stress test tomorrow   Milton Ferguson, MD 07/07/15 1704

## 2015-07-08 ENCOUNTER — Encounter (HOSPITAL_COMMUNITY): Payer: Self-pay | Admitting: Student

## 2015-07-08 ENCOUNTER — Observation Stay (HOSPITAL_BASED_OUTPATIENT_CLINIC_OR_DEPARTMENT_OTHER): Payer: Medicare Other

## 2015-07-08 DIAGNOSIS — R079 Chest pain, unspecified: Secondary | ICD-10-CM | POA: Diagnosis not present

## 2015-07-08 DIAGNOSIS — E785 Hyperlipidemia, unspecified: Secondary | ICD-10-CM | POA: Diagnosis not present

## 2015-07-08 DIAGNOSIS — I10 Essential (primary) hypertension: Secondary | ICD-10-CM | POA: Diagnosis not present

## 2015-07-08 DIAGNOSIS — Z66 Do not resuscitate: Secondary | ICD-10-CM | POA: Diagnosis not present

## 2015-07-08 DIAGNOSIS — I200 Unstable angina: Secondary | ICD-10-CM

## 2015-07-08 LAB — COMPREHENSIVE METABOLIC PANEL
ALBUMIN: 3.8 g/dL (ref 3.5–5.0)
ALT: 16 U/L — AB (ref 17–63)
AST: 22 U/L (ref 15–41)
Alkaline Phosphatase: 78 U/L (ref 38–126)
Anion gap: 7 (ref 5–15)
BILIRUBIN TOTAL: 0.8 mg/dL (ref 0.3–1.2)
BUN: 15 mg/dL (ref 6–20)
CHLORIDE: 111 mmol/L (ref 101–111)
CO2: 24 mmol/L (ref 22–32)
CREATININE: 1.07 mg/dL (ref 0.61–1.24)
Calcium: 8.9 mg/dL (ref 8.9–10.3)
GFR calc Af Amer: >60 mL/min (ref >60)
GLUCOSE: 108 mg/dL — AB (ref 65–99)
Potassium: 3.8 mmol/L (ref 3.5–5.1)
Sodium: 142 mmol/L (ref 135–145)
TOTAL PROTEIN: 6.6 g/dL (ref 6.5–8.1)

## 2015-07-08 LAB — CBC
HEMATOCRIT: 44.3 % (ref 39.0–52.0)
HEMOGLOBIN: 15.4 g/dL (ref 13.0–17.0)
MCH: 31.8 pg (ref 26.0–34.0)
MCHC: 34.8 g/dL (ref 30.0–36.0)
MCV: 91.3 fL (ref 78.0–100.0)
Platelets: 195 10*3/uL (ref 150–400)
RBC: 4.85 MIL/uL (ref 4.22–5.81)
RDW: 13.2 % (ref 11.5–15.5)
WBC: 8.4 10*3/uL (ref 4.0–10.5)

## 2015-07-08 LAB — LIPID PANEL
CHOLESTEROL: 222 mg/dL — AB (ref 0–200)
HDL: 35 mg/dL — ABNORMAL LOW (ref >40)
LDL Cholesterol: 156 mg/dL — ABNORMAL HIGH (ref 0–99)
Total CHOL/HDL Ratio: 6.3 RATIO
Triglycerides: 157 mg/dL — ABNORMAL HIGH (ref <150)
VLDL: 31 mg/dL (ref 0–40)

## 2015-07-08 LAB — ECHOCARDIOGRAM COMPLETE
Height: 68 in
WEIGHTICAEL: 2718.4 [oz_av]

## 2015-07-08 LAB — TROPONIN I
Troponin I: <0.03 ng/mL (ref <0.031)
Troponin I: <0.03 ng/mL (ref <0.031)

## 2015-07-08 MED ORDER — NITROGLYCERIN 0.4 MG SL SUBL: 0.4000 mg | SUBLINGUAL_TABLET | SUBLINGUAL | Status: DC | PRN

## 2015-07-08 MED ORDER — ISOSORBIDE MONONITRATE ER 30 MG PO TB24: 30.0000 mg | ORAL_TABLET | Freq: Every day | ORAL | Status: DC

## 2015-07-08 MED ORDER — FAMOTIDINE 20 MG PO TABS: 20.0000 mg | ORAL_TABLET | Freq: Two times a day (BID) | ORAL | Status: DC

## 2015-07-08 NOTE — Progress Notes (Signed)
Pt. C/O chest pain underneath his left breast radiating towards his stomach. Pain rated at a 5 and described as dull and aching. STAT EKG done, no new findings from previous EKG. Vital signs: BP 163/64, HR 55-60, O2 95% on RA. Pain resolved without meds. Pt. States that IMDUR might have caused pain along with not having anything to eat. Pt. Educated

## 2015-07-08 NOTE — Progress Notes (Signed)
Pt came to the door and reported Chest pain to the RN and Cardiology PA. Orders received from PA to try nitroglycerin to see if that relieves the chest pain. The chest pain started at a 5/10 and progressed to a maximum of an 8/10 and was described at an aching pain. The pain was in the left, mid and right chest and radiated to the jaw and right arm. Pt reports that he has noticed lately that he develops this chest pain that radiates to his jaw and arms after he eats. RN gave 2 nitroglycerin tablets and the chest pain came down to a 6/10 per the patient. Offered a third tablet d/t pt not being pain free, but he declined. BP ws 144/60, HR 74 prior to nitroglycerin. Before the second dose was given five minutes later, the BP was 142/56, HR 77. Pt asks if he can be sent home with nitroglycerin, will convey this to Cardiology.

## 2015-07-08 NOTE — Progress Notes (Signed)
Initial Nutrition Assessment  DOCUMENTATION CODES:   Not applicable  INTERVENTION:  -RD continue to monitor  NUTRITION DIAGNOSIS:   Inadequate oral intake related to inability to eat as evidenced by NPO status.  GOAL:   Patient will meet greater than or equal to 90% of their needs  MONITOR:   PO intake, Labs, I & O's, Weight trends  REASON FOR ASSESSMENT:   Malnutrition Screening Tool    ASSESSMENT:   Mario Berg is Mario 75 yo Berg With h/o HTN, now well controlled per patient due to multiple blood pressure meds intolerance/allergy. H/o HLD, He presented ED due to above complaints, he reported to EDP his chest pain worse with exertion, he reported to me the chest pain wakes him up in the middle of the night, marijuana give immediate relieve of chest pain.  Mario Berg is Mario pleasant 75 yo Berg who presents with angina that has re-occurred to the extent he felt he needed to come to ED. Mario Berg endorses good appetite at home, but only eating two meals Mario day, Mario large breakfast and Mario large dinner. Usually these consist of "Mario chop or Mario steak, Mario baked potato, and some sort of vegetable."   He states that he can only eat one plate but if he goes without eating it makes him "very tired."  Mario Berg's weight is stable per chart. He states back in Jan he was visiting his PCP and had lost 24# in approximately 3 months. His PCP thought he wasn't eating enough. At home, Mario Berg was drinking non-alcoholic beer that "was only 70 calories." He switched to an alcoholic beer and noticed his weight started to return to normal levels.  Nutrition-Focused physical exam completed. Findings are no fat depletion, mild muscle depletion, and no edema.   Mario Berg consumed ensure at one point, stated he liked it, but the last time he had it it gave him Mario "sour stomach." He has not had it since.  Mario Berg is NPO today, awaiting cardiology work-up; will follow-up after diet advancement  Labs and Medications reviewed.  Diet Order:  Diet  NPO time specified Except for: Sips with Meds  Skin:  Reviewed, no issues  Last BM:  Pta  Height:   Ht Readings from Last 1 Encounters:  07/07/15 5\' 8"  (1.727 m)    Weight:   Wt Readings from Last 1 Encounters:  07/07/15 169 lb 14.4 oz (77.066 kg)    Ideal Body Weight:  70 kg  BMI:  Body mass index is 25.84 kg/(m^2).  Estimated Nutritional Needs:   Kcal:  1925-2300  Protein:  80-90 grams  Fluid:  >/= 1.9L  EDUCATION NEEDS:   Education needs addressed  Satira Anis. Kollyn Lingafelter, MS, RD LDN After Hours/Weekend Pager [phone]

## 2015-07-08 NOTE — Progress Notes (Signed)
  Echocardiogram 2D Echocardiogram has been performed.  Jennette Dubin 07/08/2015, 9:10 AM

## 2015-07-08 NOTE — Consult Note (Signed)
Cardiology Consult    Patient ID: Mario Berg MRN: [id number], DOB/AGE: 74-17-1942   Admit date: 07/07/2015 Date of Consult: 07/08/2015  Primary Physician: Walker Kehr, MD Reason for Consult: Chest Pain  Primary Cardiologist: New to [facility] Requesting Provider: Dr. Erlinda Hong  History of Present Illness    Mario Berg is a 75 y.o. male with past medical history of HTN, HLD, OA and Diverticulosis who presented to [facility] ED on 07/07/2015 for evaluation of chest pain.   The patient reports having dyspnea with exertion since 1989. He had a stress test at that time but says it was normal. He has not had further cardiac workup. Ever since, he reports continued dyspnea with exertion but starting in April 2017, he started to develop chest discomfort which starts in his epigastrium, radiates into his chest, down both arms, and up his neck bilaterally. This can occur at rest or with exertion, but he does note it typically improves with rest and resolves within a few minutes. He has been using Marijuana for the pain and says this relieves it within minutes but is aware this is "not an acceptable long-term solution".   He reports trying several BP medications over the years and went into detail about the side effects of them. Lisinopril is the only medication he takes regularly along with multiple supplements.   He denies any personal history of CAD. Reports having HLD for many years but did not tolerate statins or alternatives including Gemfibrozil or Zetia. Reports his mother and father both had CHF. Reports a 40+ pack-year history but quit smoking in 1990.  While admitted, cyclic troponin values have been negative. WBC 8.4, Hgb 15.4, platelets 195. BMET shows a creatinine of 1.07 and no significant electrolyte abnormalities. Lipid Panel shows a Total cholesterol of 222, HDL 35, LDL 156. UDS positive for THC.  CXR with no active cardiopulmonary disease. EKG shows NSR, HR 60 with frequent PAC's and  isolated TWI in V1.     In the ED, his BP was elevated at 186/78. He was continued on PTA Lisinopril 20mg  daily and started on Imdur 30mg  daily. His most recent BP reading was 163/64.   On telemetry, his HR has been in the low-50's - 60's. Shows PAC's but no pathologic arrhythmias.    Past Medical History   Past Medical History  Diagnosis Date  . Diverticulitis, colon   . Diverticulosis of colon   . Gout   . Hyperlipidemia   . Hypertension     Dr. Cathie Olden  . LBP (low back pain)   . OA (osteoarthritis)   . BPH (benign prostatic hyperplasia)   . IBS (irritable bowel syndrome)     Dr. Fuller Plan  C/D  . Hemorrhoids   . Adenomatous polyp 04/2007  . Nasal congestion   . Rectal bleeding   . DIVERTICULITIS OF COLON 05/06/2007  . Gout   . Shortness of breath     [place]  . Umbilical hernia     NO PAIN-BUT PT WORRIES ABOUT IT--WEARS A BACK BRACE FOR SUPPORT AT TIMES    Past Surgical History  Procedure Laterality Date  . Skin cancer excision  12-21-2006    Nose  . Thd      05/17/2011  . Hemorrhoid surgery  05/17/2011    Procedure: HEMORRHOIDECTOMY PROLAPSED;  Surgeon: Adin Hector, MD;  Location: [facility] ORS;  Service: General;  Laterality: N/A;  [place] Hemorrhoidal Ligation Pexy   . Hemorrhoid surgery  2013  Dr [name]     Allergies  Allergies  Allergen Reactions  . Penicillins Hives and Swelling    Swelling of the hands  Has patient had a PCN reaction causing immediate rash, facial/tongue/throat swelling, SOB or lightheadedness with hypotension: Yes Has patient had a PCN reaction causing severe rash involving mucus membranes or skin necrosis: Yes Has patient had a PCN reaction that required hospitalization No Has patient had a PCN reaction occurring within the last 10 years: No If all of the above answers are "NO", then may proceed with Cephalosporin use.   . Atorvastatin Other (See Comments)    Diverticulosis per patient  . Furosemide Other (See Comments)     Diverticulosis per patient  . Bystolic [Nebivolol Hcl] Other (See Comments)    Bladder burning  . Clams [employer] Allergy] Other (See Comments)    Poison the body   . Clonidine Derivatives Other (See Comments)    weak  . Gemfibrozil Other (See Comments)    Diverticulitis  . Metoprolol Tartrate     REACTION: urinating too much  . Sulfonamide Derivatives Hives and Swelling  . Tekturna [Aliskiren Fumarate] Other (See Comments)    Gout   . Terazosin Hcl     REACTION: ringing in the ears  . Verapamil     REACTION: Wt gain    Inpatient Medications    . enoxaparin (LOVENOX) injection  40 mg Subcutaneous Q24H  . famotidine  20 mg Oral BID  . isosorbide mononitrate  30 mg Oral Daily  . lisinopril  20 mg Oral BID  . polyvinyl alcohol  1 drop Both Eyes Daily  . vitamin E  400 Units Oral Daily    Family History    Family History  Problem Relation Age of Onset  . Heart failure Mother   . Hypertension Mother   . Hypertension Father   . Heart failure Father   . Colon cancer Neg Hx     Social History    Social History   Social History  . Marital Status: Single    Spouse Name: N/A  . Number of Children: N/A  . Years of Education: N/A   Occupational History  . retired     Theme park manager   Social History Main Topics  . Smoking status: Former Smoker    Quit date: 06/03/1988  . Smokeless tobacco: Never Used  . Alcohol Use: No  . Drug Use: Yes    Special: Marijuana     Comment: MOST EVERY NIGHT FOR SLEEP  . Sexual Activity: Not on file   Other Topics Concern  . Not on file   Social History Narrative     Review of Systems    General:  No chills, fever, night sweats or weight changes.  Cardiovascular:  No edema, orthopnea, palpitations, paroxysmal nocturnal dyspnea. Positive for chest pain and dyspnea on exertion.  Dermatological: No rash, lesions/masses Respiratory: No cough, dyspnea Urologic: No hematuria, dysuria Abdominal:   No nausea, vomiting, diarrhea,  bright red blood per rectum, melena, or hematemesis Neurologic:  No visual changes, wkns, changes in mental status. All other systems reviewed and are otherwise negative except as noted above.  Physical Exam    Blood pressure 163/64, pulse 55, temperature 98.4 F (36.9 C), temperature source Oral, resp. rate 18, height 5\' 8"  (1.727 m), weight 169 lb 14.4 oz (77.066 kg), SpO2 95 %.  General: Pleasant, Caucasian male appearing in NAD. Psych: Normal affect. Neuro: Alert and oriented X 3. Moves all extremities spontaneously.  HEENT: Normal  Neck: Supple without bruits or JVD. Lungs:  Resp regular and unlabored, CTA without wheezing or rales. Heart: RRR no s3, s4, or murmurs. Abdomen: Soft, non-tender, non-distended, BS + x 4.  Extremities: No clubbing, cyanosis or edema. DP/PT/Radials 2+ and equal bilaterally.  Labs    Troponin [facility] of Care Test)  Recent Labs  07/07/15 1422  TROPIPOC 0.00    Recent Labs  07/07/15 1414 07/08/15 0102 07/08/15 0812  TROPONINI <0.03 <0.03 <0.03   Lab Results  Component Value Date   WBC 8.4 07/08/2015   HGB 15.4 07/08/2015   HCT 44.3 07/08/2015   MCV 91.3 07/08/2015   PLT 195 07/08/2015    Recent Labs Lab 07/08/15 0102  NA 142  K 3.8  CL 111  CO2 24  BUN 15  CREATININE 1.07  CALCIUM 8.9  PROT 6.6  BILITOT 0.8  ALKPHOS 78  ALT 16*  AST 22  GLUCOSE 108*   Lab Results  Component Value Date   CHOL 222* 07/08/2015   HDL 35* 07/08/2015   LDLCALC 156* 07/08/2015   TRIG 157* 07/08/2015   No results found for: [facility]   Radiology Studies    Dg Chest 2 View: 07/07/2015  CLINICAL DATA:  Chest pain EXAM: CHEST  2 VIEW COMPARISON:  04/19/2011 FINDINGS: The heart size and mediastinal contours are within normal limits. Both lungs are clear. The visualized skeletal structures are unremarkable. IMPRESSION: No active cardiopulmonary disease. Electronically Signed   By: Inez Catalina M.D.   On: 07/07/2015 14:04   EKG & Cardiac Imaging      EKG: NSR, HR 60 with frequent PAC's and isolated TWI in V1 (present on tracings in 2013). No acute ST or T-wave changes.   Echocardiogram: 07/08/2015 Study Conclusions - Left ventricle: The cavity size was normal. There was moderate  focal basal hypertrophy. Systolic function was normal. The  estimated ejection fraction was in the range of 60% to 65%. Wall  motion was normal; there were no regional wall motion  abnormalities. There was an increased relative contribution of  atrial contraction to ventricular filling. Doppler parameters are  consistent with abnormal left ventricular relaxation (grade 1  diastolic dysfunction). - Aortic valve: Trileaflet; normal thickness, mildly calcified  leaflets.  Assessment & Plan    1. Chest Pain with Typical and Atypical Features - reports having dyspnea with exertion since 1989. Had negative NST at that time and no further workup since. Starting in April 2017, he started to develop epigastric pain radiating into his chest and down both arms and into his neck. Can occur with rest or exertion and relieved with Marijuana. - EKG this admission shows no acute changes and cyclic troponin values have been negative.  - echo shows a preserved EF of 60-65% with no regional wall motion abnormalities.  - his chest discomfort has mixed features but he does have several cardiac risk factors including a 40+ pack year history, HTN, HLD, and family history of heart disease. The patient is very anxious to leave the hospital today and an NST could not be performed this late in the day, especially factoring in transport to [facility]. We discussed a catheterization but he seems to be more in favor of a NST unless a cath is absolutely indicated. - continue ACE-I and recently started Imdur. Would recommend 81mg  ASA daily if the patient is willing to take this (says "Imdur is already a new medication, let's do one at a time").   2. Accelerated HTN -  BP elevated to 186/78 at  time of admission. Reports being intolerant to multiple HTN medications including Verapamil, Terazosin, Lopressor, and Furosemide. Would not reattempt BB at this time even if he were willing to try it given his baseline bradycardia.  - Continued on PTA Lisinopril 20mg  daily and started on Imdur 30mg  daily. He is in agreement to stay on Imdur at time of discharge. Could further titrate his Imdur and/or Lisinopril in the future if BP remains difficult to control.  - most recent BP reading improved to 163/64.   3. HLD - LDL elevated to 156. Total cholesterol 222. - Significant time was spent educating the patient on high cholesterol and its association with CAD, PVD, CVA's, and other medical conditions. He refuses to be on a statin at this time or try alternatives.   Signed, Erma Heritage, PA-C 07/08/2015, 10:57 AM Pager: [phone]  Patient seen, examined. Available data reviewed. Agree with findings, assessment, and plan as outlined by Bernerd Pho, PA-C. The patient is independently interviewed and examined. He is alert and oriented, in no distress. His lungs are clear. JVP is normal. There are no carotid bruits. Heart is regular rate and rhythm without murmur or gallop. Extremities show no edema. Pedal pulses are 2+ and equal. EKG shows sinus rhythm with nonspecific ST abnormality and PACs. Troponins have been negative.  I had a very long discussion with the patient this afternoon. His clinical symptoms are highly suggestive of unstable angina. The patient has multiple untreated cardiovascular risk factors and he has crescendo symptoms of angina over the past week. He has a major aversion to medications and even to medical care. We discussed potential options for further evaluation including noninvasive stress testing versus cardiac catheterization and possible PCI. Considering his clinical syndrome and crescendo symptoms, I have recommended cardiac catheterization for definitive evaluation.  The patient declines this. He clearly states that he does not want any further testing done. He understands his risk of myocardial infarction and even death. He is willing to take isosorbide to try to improve his angina. I also recommended aspirin 81 mg daily which I am not sure he will take. He feels this is associated with gout. He is not currently willing to take any other medications. He will not take anything for his cholesterol. He will not take any more medications for treatment of hypertension.  The patient does not wish to make scheduled follow-up, but I have given them my contact information and would be happy to see him if he decides to pursue further treatment. I told him that I would work with him either with medical treatment or with cardiac catheterization, whichever he prefers. At this point he just wants to go all and he will let me know if he desires to pursue any follow-up.  Sherren Mocha, M.D. 07/08/2015 2:29 PM

## 2015-07-08 NOTE — Discharge Summary (Signed)
Discharge Summary  Mario Berg [id number] DOB: 08-Nov-1940  PCP: Walker Kehr, MD  Admit date: 07/07/2015 Discharge date: 07/08/2015  Time spent: <35mins  Recommendations for Outpatient Follow-up:  1. F/u with PMD within a week  for hospital discharge follow up, repeat cbc/bmp at follow up 2. F/u with cardiology  Discharge Diagnoses:  Active Hospital Problems   Diagnosis Date Noted  . Accelerated hypertension 07/08/2015  . Chest pain 07/07/2015    Resolved Hospital Problems   Diagnosis Date Noted Date Resolved  No resolved problems to display.    Discharge Condition: stable  Diet recommendation: heart healthy  Filed Weights   07/07/15 1842  Weight: 77.066 kg (169 lb 14.4 oz)    History of present illness:  Chief Complaint: substernal chest pain, worse in the last month.  HPI: Mario Berg is a 75 y.o. male  With h/o HTN, now well controlled per patient due to multiple blood pressure meds intolerance/allergy. H/o HLD, He presented ED due to above complaints, he reported to EDP his chest pain worse with exertion, he reported to me the chest pain wakes him up in the middle of the night, marijuana give immediate relieve of chest pain. He denies cough, he is a nonsmoker, he reported sedentary life style, he does not walk because he does not like his neighbour's dog. He denies PND, no edema, no fever. In the ED EKG with NSR, no acute ST/T changes, troponin negative, EDP requests patient being admitted for chest pain rule out , cardiology called by EDP.  Hospital Course:  Active Problems:   Chest pain   Accelerated hypertension  Chest pain rule out, chest pain with both typical and atypical features, he does has risk factors including long standing uncontrolled HTN, h/o HLD. Patient is admitted to tele , troponin negative x3, cardiology input appreciated.  Patient currently does not desire any further work up, refused offer of asa/statin and further adjust bp meds, he  just want to go home. Cardiology contact information provided to the patient. Detail please refer to cardiology consult note. He did agree to taking prn nitroglycerin.  Acid reflux? Patient agreed to Trial of pepcid  Uncontrolled HTN:  patient reported multiple medication allergies /intolerance, reported long standing uncontrolled HTN. Patient does not wish to be put on too many new medication, he agreed to continue lisinopril and trial of imdur.    HLD, ldl 156, triglyceride 157, hdl 35, total cholesterol 222, patient does not want to take statin, he want to continue fish oil.   Consultants: cardiology  Code Status: DNR , confirmed with patient with RN in ED room  Family Communication: Patient   Disposition Plan: home   Procedures:  Patient refused offer of cardiac cath  Consultations:  cardiology  Discharge Exam: BP 163/64 mmHg  Pulse 55  Temp(Src) 98.4 F (36.9 C) (Oral)  Resp 18  Ht 5\' 8"  (1.727 m)  Wt 77.066 kg (169 lb 14.4 oz)  BMI 25.84 kg/m2  SpO2 95%   General: Well developed, NAD  Eyes: PERRL  ENT: unremarkable  Neck: supple, no JVD  Cardiovascular: RRR  Respiratory: CTABL  Abdomen: soft/ND/ND, positive bowel sounds  Skin: no rash  Musculoskeletal: No edema  Psychiatric: calm/cooperative  Neurologic: no focal findings    Discharge Instructions You were cared for by a hospitalist during your hospital stay. If you have any questions about your discharge medications or the care you received while you were in the hospital after you are discharged, you can  call the unit and asked to speak with the hospitalist on call if the hospitalist that took care of you is not available. Once you are discharged, your primary care physician will handle any further medical issues. Please note that NO REFILLS for any discharge medications will be authorized once you are discharged, as it is imperative that you return to your primary care physician (or  establish a relationship with a primary care physician if you do not have one) for your aftercare needs so that they can reassess your need for medications and monitor your lab values.  Discharge Instructions    Diet - low sodium heart healthy    Complete by:  As directed      Increase activity slowly    Complete by:  As directed             Medication List    TAKE these medications        CALCIUM 600 + D PO  Take 1 tablet by mouth daily.     Co Q 10 100 MG Caps  Take 100 mg by mouth daily.     EYE WASH 99.05 % Soln  Apply 1 application to eye daily.     famotidine 20 MG tablet  Commonly known as:  PEPCID  Take 1 tablet (20 mg total) by mouth 2 (two) times daily.     Fish Oil 1200 MG Caps  Take 1,200 mg by mouth daily.     indomethacin 25 MG capsule  Commonly known as:  INDOCIN  Take 1 capsule (25 mg total) by mouth 2 (two) times daily as needed. Gout. Pt will take first before the 50 mg capsule.     isosorbide mononitrate 30 MG 24 hr tablet  Commonly known as:  IMDUR  Take 1 tablet (30 mg total) by mouth daily.     lisinopril 20 MG tablet  Commonly known as:  PRINIVIL,ZESTRIL  TAKE ONE TABLET BY MOUTH TWICE DAILY     multivitamin tablet  Take 1 tablet by mouth daily.     nitroGLYCERIN 0.4 MG SL tablet  Commonly known as:  NITROSTAT  Place 1 tablet (0.4 mg total) under the tongue every 5 (five) minutes as needed for chest pain.     oxymetazoline 0.05 % nasal spray  Commonly known as:  AFRIN  Place 1 spray into both nostrils 2 (two) times daily as needed for congestion.     vitamin E 400 UNIT capsule  Take 400 Units by mouth daily.      ASK your doctor about these medications        OVER THE COUNTER MEDICATION  Take 2 tablets by mouth daily. *Blood Pressure Factor*       Allergies  Allergen Reactions  . Penicillins Hives and Swelling    Swelling of the hands  Has patient had a PCN reaction causing immediate rash, facial/tongue/throat swelling, SOB  or lightheadedness with hypotension: Yes Has patient had a PCN reaction causing severe rash involving mucus membranes or skin necrosis: Yes Has patient had a PCN reaction that required hospitalization No Has patient had a PCN reaction occurring within the last 10 years: No If all of the above answers are "NO", then may proceed with Cephalosporin use.   . Atorvastatin Other (See Comments)    Diverticulosis per patient  . Furosemide Other (See Comments)    Diverticulosis per patient  . Bystolic [Nebivolol Hcl] Other (See Comments)    Bladder burning  . Clams [employer]  Allergy] Other (See Comments)    Poison the body   . Clonidine Derivatives Other (See Comments)    weak  . Gemfibrozil Other (See Comments)    Diverticulitis  . Metoprolol Tartrate     REACTION: urinating too much  . Sulfonamide Derivatives Hives and Swelling  . Tekturna [Aliskiren Fumarate] Other (See Comments)    Gout   . Terazosin Hcl     REACTION: ringing in the ears  . Verapamil     REACTION: Wt gain       Follow-up Information    Follow up with Walker Kehr, MD On 07/15/2015.   Specialty:  Internal Medicine   Why:  hospital discharge follow up   Contact information:   [place] [address] [phone]       Follow up with Sherren Mocha, MD In 2 weeks.   Specialty:  Cardiology   Why:  for chest pain   Contact information:   [address] [place] [address] [phone]        The results of significant diagnostics from this hospitalization (including imaging, microbiology, ancillary and laboratory) are listed below for reference.    Significant Diagnostic Studies: Dg Chest 2 View  07/07/2015  CLINICAL DATA:  Chest pain EXAM: CHEST  2 VIEW COMPARISON:  04/19/2011 FINDINGS: The heart size and mediastinal contours are within normal limits. Both lungs are clear. The visualized skeletal structures are unremarkable. IMPRESSION: No active cardiopulmonary disease.  Electronically Signed   By: Inez Catalina M.D.   On: 07/07/2015 14:04    Microbiology: No results found for this or any previous visit (from the past 240 hour(s)).   Labs: Basic Metabolic Panel:  Recent Labs Lab 07/07/15 1408 07/08/15 0102  NA 140 142  K 3.8 3.8  CL 108 111  CO2 22 24  GLUCOSE 108* 108*  BUN 14 15  CREATININE 0.82 1.07  CALCIUM 9.3 8.9   Liver Function Tests:  Recent Labs Lab 07/07/15 1414 07/08/15 0102  AST 20 22  ALT 16* 16*  ALKPHOS 87 78  BILITOT 1.5* 0.8  PROT 7.5 6.6  ALBUMIN 4.4 3.8   No results for input(s): LIPASE, AMYLASE in the last 168 hours. No results for input(s): AMMONIA in the last 168 hours. CBC:  Recent Labs Lab 07/07/15 1408 07/08/15 0102  WBC 9.5 8.4  HGB 17.0 15.4  HCT 47.1 44.3  MCV 88.7 91.3  PLT 173 195   Cardiac Enzymes:  Recent Labs Lab 07/07/15 1414 07/08/15 0102 07/08/15 0812  TROPONINI <0.03 <0.03 <0.03   BNP: BNP (last 3 results) No results for input(s): BNP in the last 8760 hours.  ProBNP (last 3 results) No results for input(s): PROBNP in the last 8760 hours.  CBG: No results for input(s): GLUCAP in the last 168 hours.     SignedFlorencia Reasons MD, PhD  [facility] 07/08/2015, 3:55 PM

## 2015-07-08 NOTE — Care Management Obs Status (Signed)
[facility] NOTIFICATION   Patient Details  Name: Mario Berg MRN: [id number] Date of Birth: June 09, 1940   Medicare Observation Status Notification Given:  Yes    MahabirJuliann Pulse, RN 07/08/2015, 11:03 AM

## 2015-07-08 NOTE — Progress Notes (Signed)
Pt. Given discharge instructions, education and all questions answered. IV taken out by nurse. CCMD called when tele taken off. Pt. Refused to be wheeled down, walked down to main entrance with NT.

## 2015-07-15 ENCOUNTER — Encounter: Payer: No Typology Code available for payment source | Admitting: Internal Medicine

## 2015-07-18 ENCOUNTER — Encounter: Payer: Self-pay | Admitting: Internal Medicine

## 2015-07-18 ENCOUNTER — Ambulatory Visit (INDEPENDENT_AMBULATORY_CARE_PROVIDER_SITE_OTHER): Payer: Medicare Other | Admitting: Internal Medicine

## 2015-07-18 VITALS — BP 208/88 | HR 71 | Ht 68.0 in | Wt 170.0 lb

## 2015-07-18 DIAGNOSIS — N320 Bladder-neck obstruction: Secondary | ICD-10-CM

## 2015-07-18 DIAGNOSIS — I10 Essential (primary) hypertension: Secondary | ICD-10-CM | POA: Diagnosis not present

## 2015-07-18 DIAGNOSIS — Z5189 Encounter for other specified aftercare: Secondary | ICD-10-CM | POA: Diagnosis not present

## 2015-07-18 DIAGNOSIS — R079 Chest pain, unspecified: Secondary | ICD-10-CM

## 2015-07-18 DIAGNOSIS — C44310 Basal cell carcinoma of skin of unspecified parts of face: Secondary | ICD-10-CM | POA: Diagnosis not present

## 2015-07-18 NOTE — Assessment & Plan Note (Signed)
Chronic. Multiple drugs intolerance. The best we can do. On Lisinopril Risks associated with treatment noncompliance were discussed. Compliance was encouraged.

## 2015-07-18 NOTE — Progress Notes (Signed)
Pre visit review using our clinic review tool, if applicable. No additional management support is needed unless otherwise documented below in the visit note. 

## 2015-07-18 NOTE — Progress Notes (Signed)
Subjective:  Patient ID: Mario Berg, male    DOB: 1941/01/18  Age: 75 y.o. MRN: [id number]  CC: No chief complaint on file.   HPI JEMARION OGBURN presents for post - hosp f/u for CP, HTN (d/c on 07/08/15 - notes, tests reviewed). F/u skin ca   "MI was rule out, chest pain with both typical and atypical features, he does has risk factors including long standing uncontrolled HTN, h/o HLD. Patient is admitted to tele , troponin negative x3, cardiology input appreciated.  Patient currently does not desire any further work up, refused offer of asa/statin and further adjust bp meds, he just want to go home. Cardiology contact information provided to the patient. Detail please refer to cardiology consult note."  Outpatient Prescriptions Prior to Visit  Medication Sig Dispense Refill  . Calcium Carb-Cholecalciferol (CALCIUM 600 + D PO) Take 1 tablet by mouth daily.    . Coenzyme Q10 (CO Q 10) 100 MG CAPS Take 100 mg by mouth daily.    . famotidine (PEPCID) 20 MG tablet Take 1 tablet (20 mg total) by mouth 2 (two) times daily. 20 tablet 0  . indomethacin (INDOCIN) 25 MG capsule Take 1 capsule (25 mg total) by mouth 2 (two) times daily as needed. Gout. Pt will take first before the 50 mg capsule. (Patient taking differently: Take 25 mg by mouth 2 (two) times daily as needed (for gout). ) 60 capsule 3  . isosorbide mononitrate (IMDUR) 30 MG 24 hr tablet Take 1 tablet (30 mg total) by mouth daily. 30 tablet 0  . lisinopril (PRINIVIL,ZESTRIL) 20 MG tablet TAKE ONE TABLET BY MOUTH TWICE DAILY 180 tablet 3  . Multiple Vitamin (MULTIVITAMIN) tablet Take 1 tablet by mouth daily.     . nitroGLYCERIN (NITROSTAT) 0.4 MG SL tablet Place 1 tablet (0.4 mg total) under the tongue every 5 (five) minutes as needed for chest pain. 30 tablet 0  . Omega-3 Fatty Acids (FISH OIL) 1200 MG CAPS Take 1,200 mg by mouth daily.     Marland Kitchen Ophthalmic Irrigation Solution (EYE WASH) 99.05 % SOLN Apply 1 application to eye daily.      Marland Kitchen oxymetazoline (AFRIN) 0.05 % nasal spray Place 1 spray into both nostrils 2 (two) times daily as needed for congestion.    . vitamin E 400 UNIT capsule Take 400 Units by mouth daily.     No facility-administered medications prior to visit.    ROS Review of Systems  Constitutional: Positive for fatigue. Negative for appetite change and unexpected weight change.  HENT: Negative for congestion, nosebleeds, sneezing, sore throat and trouble swallowing.   Eyes: Negative for itching and visual disturbance.  Respiratory: Negative for cough, chest tightness and shortness of breath.   Cardiovascular: Positive for chest pain. Negative for palpitations and leg swelling.  Gastrointestinal: Negative for nausea, diarrhea, blood in stool and abdominal distention.  Genitourinary: Positive for urgency and frequency. Negative for hematuria and decreased urine volume.  Musculoskeletal: Negative for back pain, joint swelling, gait problem and neck pain.  Skin: Negative for rash.  Neurological: Negative for dizziness, tremors, speech difficulty and weakness.  Psychiatric/Behavioral: Positive for sleep disturbance. Negative for suicidal ideas, dysphoric mood, decreased concentration and agitation. The patient is nervous/anxious.     Objective:  BP 208/88 mmHg  Pulse 71  Ht 5\' 8"  (1.727 m)  Wt 170 lb (77.111 kg)  BMI 25.85 kg/m2  SpO2 97%  BP Readings from Last 3 Encounters:  07/18/15 208/88  07/08/15 163/64  01/06/15 218/108    Wt Readings from Last 3 Encounters:  07/18/15 170 lb (77.111 kg)  07/07/15 169 lb 14.4 oz (77.066 kg)  01/06/15 172 lb (78.019 kg)    Physical Exam  Constitutional: He is oriented to person, place, and time. He appears well-developed. No distress.  NAD  HENT:  Mouth/Throat: Oropharynx is clear and moist.  Eyes: Conjunctivae are normal. Pupils are equal, round, and reactive to light.  Neck: Normal range of motion. No JVD present. No thyromegaly present.   Cardiovascular: Normal rate, regular rhythm, normal heart sounds and intact distal pulses.  Exam reveals no gallop and no friction rub.   No murmur heard. Pulmonary/Chest: Effort normal and breath sounds normal. No respiratory distress. He has no wheezes. He has no rales. He exhibits no tenderness.  Abdominal: Soft. Bowel sounds are normal. He exhibits no distension and no mass. There is no tenderness. There is no rebound and no guarding.  Musculoskeletal: Normal range of motion. He exhibits no edema or tenderness.  Lymphadenopathy:    He has no cervical adenopathy.  Neurological: He is alert and oriented to person, place, and time. He has normal reflexes. No cranial nerve deficit. He exhibits normal muscle tone. He displays a negative Romberg sign. Coordination and gait normal.  Skin: Skin is warm and dry. No rash noted. No erythema.  Psychiatric: He has a normal mood and affect. His behavior is normal. Judgment and thought content normal.    Lab Results  Component Value Date   WBC 8.4 07/08/2015   HGB 15.4 07/08/2015   HCT 44.3 07/08/2015   PLT 195 07/08/2015   GLUCOSE 108* 07/08/2015   CHOL 222* 07/08/2015   TRIG 157* 07/08/2015   HDL 35* 07/08/2015   LDLDIRECT 151.9 01/14/2012   LDLCALC 156* 07/08/2015   ALT 16* 07/08/2015   AST 22 07/08/2015   NA 142 07/08/2015   K 3.8 07/08/2015   CL 111 07/08/2015   CREATININE 1.07 07/08/2015   BUN 15 07/08/2015   CO2 24 07/08/2015   TSH 1.20 07/09/2014   PSA 3.46 07/03/2013   HGBA1C 5.6 11/28/2009    Dg Chest 2 View  07/07/2015  CLINICAL DATA:  Chest pain EXAM: CHEST  2 VIEW COMPARISON:  04/19/2011 FINDINGS: The heart size and mediastinal contours are within normal limits. Both lungs are clear. The visualized skeletal structures are unremarkable. IMPRESSION: No active cardiopulmonary disease. Electronically Signed   By: Inez Catalina M.D.   On: 07/07/2015 14:04    Assessment & Plan:   There are no diagnoses linked to this  encounter. I am having Mr. Beller maintain his multivitamin, Fish Oil, vitamin E, indomethacin, lisinopril, Co Q 10, Calcium Carb-Cholecalciferol (CALCIUM 600 + D PO), EYE WASH, oxymetazoline, famotidine, isosorbide mononitrate, and nitroGLYCERIN.  No orders of the defined types were placed in this encounter.     Follow-up: No Follow-up on file.  Walker Kehr, MD

## 2015-07-18 NOTE — Assessment & Plan Note (Addendum)
F/u w/Derm - Dr Danielle Dess

## 2015-07-18 NOTE — Assessment & Plan Note (Signed)
5/17 S/p card consult ASA, Isosorbide

## 2015-07-25 ENCOUNTER — Other Ambulatory Visit: Payer: Self-pay | Admitting: Internal Medicine

## 2015-07-25 DIAGNOSIS — L905 Scar conditions and fibrosis of skin: Secondary | ICD-10-CM | POA: Diagnosis not present

## 2015-07-25 DIAGNOSIS — Z85828 Personal history of other malignant neoplasm of skin: Secondary | ICD-10-CM | POA: Diagnosis not present

## 2015-07-28 ENCOUNTER — Other Ambulatory Visit: Payer: Self-pay | Admitting: *Deleted

## 2015-07-28 MED ORDER — ISOSORBIDE MONONITRATE ER 30 MG PO TB24: 30.0000 mg | ORAL_TABLET | Freq: Every day | ORAL | Status: DC

## 2015-07-28 MED ORDER — NITROGLYCERIN 0.4 MG SL SUBL: 0.4000 mg | SUBLINGUAL_TABLET | SUBLINGUAL | Status: DC | PRN

## 2015-07-29 ENCOUNTER — Telehealth: Payer: Self-pay | Admitting: Cardiovascular Disease

## 2015-07-29 NOTE — Telephone Encounter (Signed)
New message    Pt is calling for rn to schedule his cath no TUESDAYS AND WEDNESDAYS  Please give pt a call

## 2015-07-29 NOTE — Telephone Encounter (Signed)
Received call from patient.Advised he needs to schedule post hospital appointment with Dr.Cooper.Advised Dr.Cooper's schedule is full.I will send message to his nurse Theodosia Quay RN.Pt request appt on a Mon,Thurs or Fri.

## 2015-07-29 NOTE — Telephone Encounter (Signed)
Returned call to patient he stated he was cooking his lunch, he could not talk he would call me back.

## 2015-08-02 NOTE — Telephone Encounter (Signed)
F/u  Pt following up about previous note from 5/26- concerning his cath. Please call back and discuss.

## 2015-08-02 NOTE — Telephone Encounter (Signed)
I spoke with the pt and have arranged appointment with Dr Burt Knack on 08/05/15.

## 2015-08-05 ENCOUNTER — Ambulatory Visit (INDEPENDENT_AMBULATORY_CARE_PROVIDER_SITE_OTHER): Payer: Medicare Other | Admitting: Cardiovascular Disease

## 2015-08-05 ENCOUNTER — Encounter: Payer: Self-pay | Admitting: Cardiovascular Disease

## 2015-08-05 VITALS — BP 180/60 | HR 64 | Ht 68.0 in | Wt 166.4 lb

## 2015-08-05 DIAGNOSIS — R079 Chest pain, unspecified: Secondary | ICD-10-CM

## 2015-08-05 DIAGNOSIS — I208 Other forms of angina pectoris: Secondary | ICD-10-CM

## 2015-08-05 NOTE — Progress Notes (Signed)
Cardiology Office Note Date:  08/05/2015   ID:  Mario Berg, DOB [birth year], MRN [id number]  PCP:  Walker Kehr, MD  Cardiologist:  Sherren Mocha, MD    Chief Complaint  Patient presents with  . essential hypertension  . Chest Pain  . Cough    History of Present Illness: Mario Berg is a 75 y.o. male who presents for hospital follow-up evaluation. The patient was seen 07/08/2015 with symptoms of unstable angina. He has had multiple cardiovascular risk factors which have been untreated for many years. The patient's care has been complicated by a major of version to medications and to medical care. I recommended cardiac catheterization but the patient declined. He clearly understood the risks of heart attack and even death without undergoing evaluation. He was started on isosorbide and aspirin. He did not want to take any medicine for cholesterol. He was given my information for follow-up and called in for an appointment.  The patient complains of fatigue and feeling "washed out." His angina has definitely improved since he started isosorbide. He hasn't taken any nitroglycerin in 3-4 days. He denies shortness of breath or leg swelling.   Past Medical History  Diagnosis Date  . Diverticulitis, colon   . Diverticulosis of colon   . Gout   . Hyperlipidemia   . Hypertension   . LBP (low back pain)   . OA (osteoarthritis)   . BPH (benign prostatic hyperplasia)   . IBS (irritable bowel syndrome)     Dr. Fuller Plan  C/D  . Hemorrhoids   . Adenomatous polyp 04/2007  . Nasal congestion   . Rectal bleeding   . DIVERTICULITIS OF COLON 05/06/2007  . Gout   . Shortness of breath     [place]  . Umbilical hernia     NO PAIN-BUT PT WORRIES ABOUT IT--WEARS A BACK BRACE FOR SUPPORT AT TIMES  . Squamous cell cancer of skin of left cheek   . Merkel cell skin cancer of cheek [facility])     Past Surgical History  Procedure Laterality Date  . Skin cancer excision  12-21-2006    Nose  .  Thd      05/17/2011  . Hemorrhoid surgery  05/17/2011    Procedure: HEMORRHOIDECTOMY PROLAPSED;  Surgeon: Adin Hector, MD;  Location: [facility] ORS;  Service: General;  Laterality: N/A;  [place] Hemorrhoidal Ligation [place]   . Hemorrhoid surgery  2013    Dr [name]    Current Outpatient Prescriptions  Medication Sig Dispense Refill  . Calcium Carb-Cholecalciferol (CALCIUM 600 + D PO) Take 1 tablet by mouth daily.    . Coenzyme Q10 (CO Q 10) 100 MG CAPS Take 100 mg by mouth daily.    . famotidine (PEPCID) 20 MG tablet Take 1 tablet (20 mg total) by mouth 2 (two) times daily. 20 tablet 0  . indomethacin (INDOCIN) 25 MG capsule Take 25 mg by mouth 2 (two) times daily as needed (gout flares).    . isosorbide mononitrate (IMDUR) 30 MG 24 hr tablet Take 1 tablet (30 mg total) by mouth daily. 90 tablet 1  . lisinopril (PRINIVIL,ZESTRIL) 20 MG tablet TAKE ONE TABLET BY MOUTH TWICE DAILY. 180 tablet 0  . Multiple Vitamin (MULTIVITAMIN) tablet Take 1 tablet by mouth daily.     . nitroGLYCERIN (NITROSTAT) 0.4 MG SL tablet Place 1 tablet (0.4 mg total) under the tongue every 5 (five) minutes as needed for chest pain. 30 tablet 0  . Omega-3 Fatty Acids (  FISH OIL) 1200 MG CAPS Take 1,200 mg by mouth daily.     Marland Kitchen Ophthalmic Irrigation Solution (EYE WASH) 99.05 % SOLN Apply 1 application to eye daily.    Marland Kitchen oxymetazoline (AFRIN) 0.05 % nasal spray Place 1 spray into both nostrils 2 (two) times daily as needed for congestion.    . vitamin E 400 UNIT capsule Take 400 Units by mouth daily.     No current facility-administered medications for this visit.    Allergies:   Penicillins; Atorvastatin; Furosemide; Bystolic; Clams; Clonidine derivatives; Gemfibrozil; Metoprolol tartrate; Sulfonamide derivatives; Tekturna; Terazosin hcl; and Verapamil   Social History:  The patient  reports that he quit smoking about 27 years ago. His smoking use included Cigarettes. He has a 44 pack-year smoking history. He has never used  smokeless tobacco. He reports that he uses illicit drugs (Marijuana). He reports that he does not drink alcohol.   Family History:  The patient's  family history includes Heart failure in his father and mother; Hypertension in his father and mother. There is no history of Colon cancer.    ROS:  Please see the history of present illness.  Otherwise, review of systems is positive for cough, back pain, excessive fatigue, chest pressure.  All other systems are reviewed and negative.    PHYSICAL EXAM: VS:  BP 180/60 mmHg  Pulse 64  Ht 5\' 8"  (1.727 m)  Wt 166 lb 6.4 oz (75.479 kg)  BMI 25.31 kg/m2 , BMI Body mass index is 25.31 kg/(m^2). GEN: Well nourished, well developed, in no acute distress HEENT: normal Neck: no JVD, no masses. No carotid bruits Cardiac: RRR without murmur or gallop                Respiratory:  clear to auscultation bilaterally, normal work of breathing GI: soft, nontender, nondistended, + BS MS: no deformity or atrophy Ext: no pretibial edema, pedal pulses 2+= bilaterally Skin: warm and dry, no rash Neuro:  Strength and sensation are intact Psych: euthymic mood, full affect  EKG:  EKG is ordered today. The ekg ordered today shows normal sinus rhythm 64 bpm, nonspecific ST abnormality  Recent Labs: 07/08/2015: ALT 16*; BUN 15; Creatinine, Ser 1.07; Hemoglobin 15.4; Platelets 195; Potassium 3.8; Sodium 142   Lipid Panel     Component Value Date/Time   CHOL 222* 07/08/2015 0102   TRIG 157* 07/08/2015 0102   HDL 35* 07/08/2015 0102   CHOLHDL 6.3 07/08/2015 0102   VLDL 31 07/08/2015 0102   LDLCALC 156* 07/08/2015 0102   LDLDIRECT 151.9 01/14/2012 1301      Wt Readings from Last 3 Encounters:  08/05/15 166 lb 6.4 oz (75.479 kg)  07/18/15 170 lb (77.111 kg)  07/07/15 169 lb 14.4 oz (77.066 kg)    ASSESSMENT AND PLAN: 1.  Stable angina, CCS class 2-3. The patient is better on isosorbide. His medical program is extremely limited by multiple allergies and an  aversion to medications. He is not willing to take aspirin because he feels it is linked to gout. I reviewed considerations around cardiac catheterization with the patient again today. He has significant anginal symptoms and I think there is a high likelihood of multivessel coronary artery disease. I have recommended cardiac catheterization, but he declines at this time. He will follow-up in 6 months. We also discussed increasing his antianginal program but he is not interested in that.  2. Essential hypertension, uncontrolled: Long-standing issue with multiple antihypertensive medication intolerances. These been reviewed and include beta blockers,  calcium channel blockers, and various other classes of antihypertensive drugs.  3. Hyperlipidemia: Unwilling to take lipid-lowering agents. Most recent cholesterol was 222 with an LDL of 156  Current medicines are reviewed with the patient today.  The patient does not have concerns regarding medicines.  Labs/ tests ordered today include:   Orders Placed This Encounter  Procedures  . EKG 12-Lead   Disposition:   FU 6 months  Signed, Sherren Mocha, MD  08/05/2015 5:57 PM    [facility] [address], [place], [place]  [address] Phone: [phone]; Fax: [phone]

## 2015-08-05 NOTE — Patient Instructions (Signed)

## 2015-09-13 ENCOUNTER — Other Ambulatory Visit (INDEPENDENT_AMBULATORY_CARE_PROVIDER_SITE_OTHER): Payer: Medicare Other

## 2015-09-13 DIAGNOSIS — N320 Bladder-neck obstruction: Secondary | ICD-10-CM | POA: Diagnosis not present

## 2015-09-13 DIAGNOSIS — I10 Essential (primary) hypertension: Secondary | ICD-10-CM | POA: Diagnosis not present

## 2015-09-13 LAB — BASIC METABOLIC PANEL
BUN: 9 mg/dL (ref 6–23)
CHLORIDE: 107 meq/L (ref 96–112)
CO2: 28 meq/L (ref 19–32)
Calcium: 9.9 mg/dL (ref 8.4–10.5)
Creatinine, Ser: 1.03 mg/dL (ref 0.40–1.50)
GFR: 74.98 mL/min (ref >60.00)
GLUCOSE: 101 mg/dL — AB (ref 70–99)
POTASSIUM: 4.8 meq/L (ref 3.5–5.1)
SODIUM: 141 meq/L (ref 135–145)

## 2015-09-13 LAB — PSA: PSA: 6.84 ng/mL — ABNORMAL HIGH (ref 0.10–4.00)

## 2015-09-16 ENCOUNTER — Ambulatory Visit (INDEPENDENT_AMBULATORY_CARE_PROVIDER_SITE_OTHER): Payer: Medicare Other | Admitting: Internal Medicine

## 2015-09-16 ENCOUNTER — Encounter: Payer: Self-pay | Admitting: Internal Medicine

## 2015-09-16 VITALS — BP 190/98 | HR 57 | Wt 171.0 lb

## 2015-09-16 DIAGNOSIS — E785 Hyperlipidemia, unspecified: Secondary | ICD-10-CM | POA: Diagnosis not present

## 2015-09-16 DIAGNOSIS — N400 Enlarged prostate without lower urinary tract symptoms: Secondary | ICD-10-CM

## 2015-09-16 DIAGNOSIS — I10 Essential (primary) hypertension: Secondary | ICD-10-CM | POA: Diagnosis not present

## 2015-09-16 DIAGNOSIS — M109 Gout, unspecified: Secondary | ICD-10-CM | POA: Diagnosis not present

## 2015-09-16 DIAGNOSIS — R079 Chest pain, unspecified: Secondary | ICD-10-CM | POA: Diagnosis not present

## 2015-09-16 DIAGNOSIS — I208 Other forms of angina pectoris: Secondary | ICD-10-CM

## 2015-09-16 MED ORDER — ASPIRIN EC 81 MG PO TBEC: 81.0000 mg | DELAYED_RELEASE_TABLET | Freq: Every day | ORAL | Status: AC

## 2015-09-16 NOTE — Assessment & Plan Note (Signed)
  Chronic sx's Declined Rx

## 2015-09-16 NOTE — Progress Notes (Signed)
Subjective:  Patient ID: Mario Berg, male    DOB: 08-Oct-1940  Age: 75 y.o. MRN: [id number]  CC: No chief complaint on file.   HPI Mario Berg presents for angina, HTN, OAB f/u  Dr Burt Knack recommended cardiac catheterization but the patient declined. He clearly understood the risks of heart attack and even death without undergoing evaluation. He was started on isosorbide and aspirin. He did not want to take any medicine for cholesterol.   The patient complains of fatigue and feeling "washed out." His angina has definitely improved since he started isosorbide. He hasn't taken any nitroglycerin in 3-4 days.   He denies shortness of breath or leg swelling.  Outpatient Prescriptions Prior to Visit  Medication Sig Dispense Refill  . Calcium Carb-Cholecalciferol (CALCIUM 600 + D PO) Take 1 tablet by mouth daily.    . Coenzyme Q10 (CO Q 10) 100 MG CAPS Take 100 mg by mouth daily.    . famotidine (PEPCID) 20 MG tablet Take 1 tablet (20 mg total) by mouth 2 (two) times daily. 20 tablet 0  . indomethacin (INDOCIN) 25 MG capsule Take 25 mg by mouth 2 (two) times daily as needed (gout flares).    . isosorbide mononitrate (IMDUR) 30 MG 24 hr tablet Take 1 tablet (30 mg total) by mouth daily. 90 tablet 1  . lisinopril (PRINIVIL,ZESTRIL) 20 MG tablet TAKE ONE TABLET BY MOUTH TWICE DAILY. 180 tablet 0  . Multiple Vitamin (MULTIVITAMIN) tablet Take 1 tablet by mouth daily.     . nitroGLYCERIN (NITROSTAT) 0.4 MG SL tablet Place 1 tablet (0.4 mg total) under the tongue every 5 (five) minutes as needed for chest pain. 30 tablet 0  . Omega-3 Fatty Acids (FISH OIL) 1200 MG CAPS Take 1,200 mg by mouth daily.     Marland Kitchen Ophthalmic Irrigation Solution (EYE WASH) 99.05 % SOLN Apply 1 application to eye daily.    Marland Kitchen oxymetazoline (AFRIN) 0.05 % nasal spray Place 1 spray into both nostrils 2 (two) times daily as needed for congestion.    . vitamin E 400 UNIT capsule Take 400 Units by mouth daily.     No  facility-administered medications prior to visit.    ROS Review of Systems  Constitutional: Positive for fatigue. Negative for appetite change and unexpected weight change.  HENT: Negative for congestion, nosebleeds, sneezing, sore throat and trouble swallowing.   Eyes: Negative for itching and visual disturbance.  Respiratory: Negative for cough.   Cardiovascular: Positive for chest pain. Negative for palpitations and leg swelling.  Gastrointestinal: Negative for nausea, diarrhea, blood in stool and abdominal distention.  Genitourinary: Positive for urgency. Negative for frequency, hematuria and decreased urine volume.  Musculoskeletal: Negative for back pain, joint swelling, gait problem and neck pain.  Skin: Negative for rash.  Neurological: Negative for dizziness, tremors, speech difficulty and weakness.  Psychiatric/Behavioral: Negative for sleep disturbance, dysphoric mood and agitation. The patient is not nervous/anxious.     Objective:  BP 190/98 mmHg  Pulse 57  Wt 171 lb (77.565 kg)  SpO2 96%  BP Readings from Last 3 Encounters:  09/16/15 190/98  08/05/15 180/60  07/18/15 208/88    Wt Readings from Last 3 Encounters:  09/16/15 171 lb (77.565 kg)  08/05/15 166 lb 6.4 oz (75.479 kg)  07/18/15 170 lb (77.111 kg)    Physical Exam  Constitutional: He is oriented to person, place, and time. He appears well-developed. No distress.  NAD  HENT:  Mouth/Throat: Oropharynx is clear and moist.  Eyes: Conjunctivae are normal. Pupils are equal, round, and reactive to light.  Neck: Normal range of motion. No JVD present. No thyromegaly present.  Cardiovascular: Normal rate, regular rhythm, normal heart sounds and intact distal pulses.  Exam reveals no gallop and no friction rub.   No murmur heard. Pulmonary/Chest: Effort normal and breath sounds normal. No respiratory distress. He has no wheezes. He has no rales. He exhibits no tenderness.  Abdominal: Soft. Bowel sounds are  normal. He exhibits no distension and no mass. There is no tenderness. There is no rebound and no guarding.  Musculoskeletal: Normal range of motion. He exhibits no edema or tenderness.  Lymphadenopathy:    He has no cervical adenopathy.  Neurological: He is alert and oriented to person, place, and time. He has normal reflexes. No cranial nerve deficit. He exhibits normal muscle tone. He displays a negative Romberg sign. Coordination and gait normal.  Skin: Skin is warm and dry. No rash noted.  Psychiatric: He has a normal mood and affect. His behavior is normal. Judgment and thought content normal.    Lab Results  Component Value Date   WBC 8.4 07/08/2015   HGB 15.4 07/08/2015   HCT 44.3 07/08/2015   PLT 195 07/08/2015   GLUCOSE 101* 09/13/2015   CHOL 222* 07/08/2015   TRIG 157* 07/08/2015   HDL 35* 07/08/2015   LDLDIRECT 151.9 01/14/2012   LDLCALC 156* 07/08/2015   ALT 16* 07/08/2015   AST 22 07/08/2015   NA 141 09/13/2015   K 4.8 09/13/2015   CL 107 09/13/2015   CREATININE 1.03 09/13/2015   BUN 9 09/13/2015   CO2 28 09/13/2015   TSH 1.20 07/09/2014   PSA 6.84* 09/13/2015   HGBA1C 5.6 11/28/2009    Dg Chest 2 View  07/07/2015  CLINICAL DATA:  Chest pain EXAM: CHEST  2 VIEW COMPARISON:  04/19/2011 FINDINGS: The heart size and mediastinal contours are within normal limits. Both lungs are clear. The visualized skeletal structures are unremarkable. IMPRESSION: No active cardiopulmonary disease. Electronically Signed   By: Inez Catalina M.D.   On: 07/07/2015 14:04    Assessment & Plan:   There are no diagnoses linked to this encounter. I am having Mario Berg maintain his multivitamin, Fish Oil, vitamin E, Co Q 10, Calcium Carb-Cholecalciferol (CALCIUM 600 + D PO), EYE WASH, oxymetazoline, famotidine, lisinopril, nitroGLYCERIN, isosorbide mononitrate, and indomethacin.  No orders of the defined types were placed in this encounter.     Follow-up: No Follow-up on file.  Walker Kehr, MD

## 2015-09-16 NOTE — Progress Notes (Signed)
Pre visit review using our clinic review tool, if applicable. No additional management support is needed unless otherwise documented below in the visit note. 

## 2015-09-16 NOTE — Assessment & Plan Note (Addendum)
Relapsing less

## 2015-09-16 NOTE — Assessment & Plan Note (Signed)
Declined statins. On Fish oil Omega 3 and diet

## 2015-09-16 NOTE — Assessment & Plan Note (Signed)
Dr Burt Knack recommended cardiac catheterization but the patient declined. He clearly understood the risks of heart attack and even death without undergoing evaluation. He was started on isosorbide and aspirin. He did not want to take any medicine for cholesterol.  Re-start ASA Isosorbide po

## 2015-09-16 NOTE — Assessment & Plan Note (Signed)
Chronic. Multiple drugs intolerance. The best we can do. On Lisinopril 

## 2015-10-24 ENCOUNTER — Other Ambulatory Visit: Payer: Self-pay | Admitting: Internal Medicine

## 2015-11-28 ENCOUNTER — Other Ambulatory Visit: Payer: Self-pay | Admitting: Internal Medicine

## 2015-11-28 DIAGNOSIS — I10 Essential (primary) hypertension: Secondary | ICD-10-CM

## 2016-01-16 ENCOUNTER — Encounter: Payer: Self-pay | Admitting: Internal Medicine

## 2016-01-16 ENCOUNTER — Ambulatory Visit (INDEPENDENT_AMBULATORY_CARE_PROVIDER_SITE_OTHER): Payer: Medicare Other | Admitting: Internal Medicine

## 2016-01-16 ENCOUNTER — Other Ambulatory Visit: Payer: Medicare Other

## 2016-01-16 VITALS — BP 182/102 | HR 68 | Temp 98.0°F | Ht 68.0 in | Wt 171.0 lb

## 2016-01-16 DIAGNOSIS — K5909 Other constipation: Secondary | ICD-10-CM

## 2016-01-16 DIAGNOSIS — I10 Essential (primary) hypertension: Secondary | ICD-10-CM

## 2016-01-16 DIAGNOSIS — I208 Other forms of angina pectoris: Secondary | ICD-10-CM | POA: Diagnosis not present

## 2016-01-16 DIAGNOSIS — R972 Elevated prostate specific antigen [PSA]: Secondary | ICD-10-CM

## 2016-01-16 DIAGNOSIS — N401 Enlarged prostate with lower urinary tract symptoms: Secondary | ICD-10-CM

## 2016-01-16 DIAGNOSIS — R350 Frequency of micturition: Secondary | ICD-10-CM

## 2016-01-16 DIAGNOSIS — R079 Chest pain, unspecified: Secondary | ICD-10-CM

## 2016-01-16 NOTE — Assessment & Plan Note (Signed)
OAB sx's discussed

## 2016-01-16 NOTE — Progress Notes (Signed)
Pre visit review using our clinic review tool, if applicable. No additional management support is needed unless otherwise documented below in the visit note. 

## 2016-01-16 NOTE — Assessment & Plan Note (Signed)
No relapse Isosorbide

## 2016-01-16 NOTE — Assessment & Plan Note (Signed)
PSA

## 2016-01-16 NOTE — Assessment & Plan Note (Signed)
Lisinoril

## 2016-01-16 NOTE — Assessment & Plan Note (Signed)
Metamucil

## 2016-01-16 NOTE — Progress Notes (Signed)
Subjective:  Patient ID: Mario Berg, male    DOB: 1941-02-01  Age: 75 y.o. MRN: [id number]  CC: Follow-up (3 month follow up)   HPI BARI DOORLEY presents for angina, OAB, HTN f/u. The pt would like to do a minimum of health intervention  Outpatient Medications Prior to Visit  Medication Sig Dispense Refill  . aspirin EC 81 MG tablet Take 1 tablet (81 mg total) by mouth daily. 100 tablet 3  . Calcium Carb-Cholecalciferol (CALCIUM 600 + D PO) Take 1 tablet by mouth daily.    . Coenzyme Q10 (CO Q 10) 100 MG CAPS Take 100 mg by mouth daily.    . famotidine (PEPCID) 20 MG tablet Take 1 tablet (20 mg total) by mouth 2 (two) times daily. 20 tablet 0  . indomethacin (INDOCIN) 25 MG capsule Take 25 mg by mouth 2 (two) times daily as needed (gout flares).    . isosorbide mononitrate (IMDUR) 30 MG 24 hr tablet Take 1 tablet (30 mg total) by mouth daily. 90 tablet 1  . lisinopril (PRINIVIL,ZESTRIL) 20 MG tablet TAKE ONE TABLET BY MOUTH TWICE DAILY. 180 tablet 0  . lisinopril (PRINIVIL,ZESTRIL) 20 MG tablet TAKE ONE TABLET BY MOUTH TWICE DAILY. 180 tablet 3  . Multiple Vitamin (MULTIVITAMIN) tablet Take 1 tablet by mouth daily.     . nitroGLYCERIN (NITROSTAT) 0.4 MG SL tablet PLACE 1 TABLET UNDER THE TONGUE EVERY 5 MINUTES AS NEEDED FOR CHEST PAIN 25 tablet 0  . Omega-3 Fatty Acids (FISH OIL) 1200 MG CAPS Take 1,200 mg by mouth daily.     Marland Kitchen Ophthalmic Irrigation Solution (EYE WASH) 99.05 % SOLN Apply 1 application to eye daily.    Marland Kitchen oxymetazoline (AFRIN) 0.05 % nasal spray Place 1 spray into both nostrils 2 (two) times daily as needed for congestion.    . vitamin E 400 UNIT capsule Take 400 Units by mouth daily.     No facility-administered medications prior to visit.     ROS Review of Systems  Constitutional: Negative for appetite change, fatigue and unexpected weight change.  HENT: Negative for congestion, nosebleeds, sneezing, sore throat and trouble swallowing.   Eyes: Negative for  itching and visual disturbance.  Respiratory: Negative for cough.   Cardiovascular: Negative for chest pain, palpitations and leg swelling.  Gastrointestinal: Negative for abdominal distention, blood in stool, diarrhea and nausea.  Genitourinary: Positive for frequency and urgency. Negative for hematuria.  Musculoskeletal: Negative for back pain, gait problem, joint swelling and neck pain.  Skin: Negative for rash.  Neurological: Negative for dizziness, tremors, speech difficulty and weakness.  Psychiatric/Behavioral: Negative for agitation, dysphoric mood and sleep disturbance. The patient is nervous/anxious.     Objective:  BP (!) 182/102 (BP Location: Left Arm, Patient Position: Sitting, Cuff Size: Normal)   Pulse 68   Temp 98 F (36.7 C)   Ht 5\' 8"  (1.727 m)   Wt 171 lb (77.6 kg)   SpO2 97%   BMI 26.00 kg/m   BP Readings from Last 3 Encounters:  01/16/16 (!) 182/102  09/16/15 (!) 190/98  08/05/15 (!) 180/60    Wt Readings from Last 3 Encounters:  01/16/16 171 lb (77.6 kg)  09/16/15 171 lb (77.6 kg)  08/05/15 166 lb 6.4 oz (75.5 kg)    Physical Exam  Constitutional: He is oriented to person, place, and time. He appears well-developed. No distress.  NAD  HENT:  Mouth/Throat: Oropharynx is clear and moist.  Eyes: Conjunctivae are normal. Pupils are  equal, round, and reactive to light.  Neck: Normal range of motion. No JVD present. No thyromegaly present.  Cardiovascular: Normal rate, regular rhythm, normal heart sounds and intact distal pulses.  Exam reveals no gallop and no friction rub.   No murmur heard. Pulmonary/Chest: Effort normal and breath sounds normal. No respiratory distress. He has no wheezes. He has no rales. He exhibits no tenderness.  Abdominal: Soft. Bowel sounds are normal. He exhibits no distension and no mass. There is no tenderness. There is no rebound and no guarding.  Musculoskeletal: Normal range of motion. He exhibits no edema or tenderness.    Lymphadenopathy:    He has no cervical adenopathy.  Neurological: He is alert and oriented to person, place, and time. He has normal reflexes. No cranial nerve deficit. He exhibits normal muscle tone. He displays a negative Romberg sign. Coordination and gait normal.  Skin: Skin is warm and dry. No rash noted.  Psychiatric: He has a normal mood and affect. His behavior is normal. Judgment and thought content normal.    Lab Results  Component Value Date   WBC 8.4 07/08/2015   HGB 15.4 07/08/2015   HCT 44.3 07/08/2015   PLT 195 07/08/2015   GLUCOSE 101 (H) 09/13/2015   CHOL 222 (H) 07/08/2015   TRIG 157 (H) 07/08/2015   HDL 35 (L) 07/08/2015   LDLDIRECT 151.9 01/14/2012   LDLCALC 156 (H) 07/08/2015   ALT 16 (L) 07/08/2015   AST 22 07/08/2015   NA 141 09/13/2015   K 4.8 09/13/2015   CL 107 09/13/2015   CREATININE 1.03 09/13/2015   BUN 9 09/13/2015   CO2 28 09/13/2015   TSH 1.20 07/09/2014   PSA 6.84 (H) 09/13/2015   HGBA1C 5.6 11/28/2009    Dg Chest 2 View  Result Date: 07/07/2015 CLINICAL DATA:  Chest pain EXAM: CHEST  2 VIEW COMPARISON:  04/19/2011 FINDINGS: The heart size and mediastinal contours are within normal limits. Both lungs are clear. The visualized skeletal structures are unremarkable. IMPRESSION: No active cardiopulmonary disease. Electronically Signed   By: Inez Catalina M.D.   On: 07/07/2015 14:04    Assessment & Plan:   There are no diagnoses linked to this encounter. I am having Mr. Schwein maintain his multivitamin, Fish Oil, vitamin E, Co Q 10, Calcium Carb-Cholecalciferol (CALCIUM 600 + D PO), EYE WASH, oxymetazoline, famotidine, lisinopril, isosorbide mononitrate, indomethacin, aspirin EC, nitroGLYCERIN, and lisinopril.  No orders of the defined types were placed in this encounter.    Follow-up: No Follow-up on file.  Walker Kehr, MD

## 2016-02-05 ENCOUNTER — Other Ambulatory Visit: Payer: Self-pay | Admitting: Internal Medicine

## 2016-02-28 ENCOUNTER — Other Ambulatory Visit: Payer: Self-pay | Admitting: Internal Medicine

## 2016-03-01 ENCOUNTER — Telehealth: Payer: Self-pay | Admitting: Cardiovascular Disease

## 2016-03-01 MED ORDER — NITROGLYCERIN 0.4 MG SL SUBL: SUBLINGUAL_TABLET | SUBLINGUAL | 1 refills | Status: DC

## 2016-03-01 NOTE — Telephone Encounter (Signed)
LET MR Mario Berg KNOW THAT HIS NITRO WAS SENT IN AS REQUSTED.

## 2016-03-01 NOTE — Telephone Encounter (Signed)
Mario Berg is calling because he has a prescription for Nitroglycerin tablets for chest pains and he currently only has 2 or 3 tablets left. He would like to know if he can get a refill for this medication? He is not sure if he was prescribed a 30 day or 90 day supply. [facility] [address] [phone].) Please call, if he is not home, he request we leave a message. Thanks.

## 2016-03-12 NOTE — Telephone Encounter (Signed)
The pt said 2 months ago he had a week where he had to take 3 tablets of Nitroglycerin..  Normally the pt only takes 1 per week. The patient contacted his PCP office to obtain a refill on Nitroglycerin before the holidays and they felt like he was using NTG to frequently and refused to refill NTG and advised him to contact cardiology.  The did as he was instructed and NTG was refilled 03/01/16.  I advised the pt that he is due to schedule follow-up with Dr Burt Knack but at this time he prefers not to make an appointment.  The pt said he will contact our office if he feels like an appointment is needed.

## 2016-04-24 ENCOUNTER — Telehealth: Payer: Self-pay | Admitting: Internal Medicine

## 2016-04-24 NOTE — Telephone Encounter (Signed)
Called patient to schedule awv. Lvm for patient to call office to schedule appt.  °

## 2016-05-14 ENCOUNTER — Ambulatory Visit: Payer: Medicare Other | Admitting: Internal Medicine

## 2016-06-15 ENCOUNTER — Telehealth: Payer: Self-pay | Admitting: Internal Medicine

## 2016-06-15 NOTE — Telephone Encounter (Signed)
Second call to patient regarding awv. Lvm for patient to call office to schedule appt.

## 2016-11-08 ENCOUNTER — Other Ambulatory Visit: Payer: Self-pay | Admitting: Cardiovascular Disease

## 2016-11-09 NOTE — Telephone Encounter (Signed)
Pt needs to call office and schedule appointment for further refiils

## 2016-12-22 ENCOUNTER — Other Ambulatory Visit: Payer: Self-pay | Admitting: Internal Medicine

## 2016-12-22 DIAGNOSIS — I10 Essential (primary) hypertension: Secondary | ICD-10-CM

## 2017-03-04 ENCOUNTER — Other Ambulatory Visit: Payer: Self-pay | Admitting: Internal Medicine

## 2017-03-06 NOTE — Telephone Encounter (Signed)
Routing to tammy,sched---can you please contact patient to get appointment scheduled to see dr plotnikov---I am sending in 45 day supply of medication requested, but he needs appt before any further refills, thanks

## 2017-03-06 NOTE — Telephone Encounter (Signed)
Left vm for patient to call back to scheduled med follow up

## 2017-03-26 ENCOUNTER — Encounter: Payer: Self-pay | Admitting: Internal Medicine

## 2017-03-26 ENCOUNTER — Other Ambulatory Visit (INDEPENDENT_AMBULATORY_CARE_PROVIDER_SITE_OTHER): Payer: Medicare Other

## 2017-03-26 ENCOUNTER — Ambulatory Visit (INDEPENDENT_AMBULATORY_CARE_PROVIDER_SITE_OTHER): Payer: Medicare Other | Admitting: Internal Medicine

## 2017-03-26 VITALS — BP 164/86 | HR 71 | Temp 97.9°F | Ht 68.0 in | Wt 169.0 lb

## 2017-03-26 DIAGNOSIS — I10 Essential (primary) hypertension: Secondary | ICD-10-CM

## 2017-03-26 DIAGNOSIS — N320 Bladder-neck obstruction: Secondary | ICD-10-CM

## 2017-03-26 DIAGNOSIS — N888 Other specified noninflammatory disorders of cervix uteri: Secondary | ICD-10-CM

## 2017-03-26 DIAGNOSIS — R079 Chest pain, unspecified: Secondary | ICD-10-CM | POA: Diagnosis not present

## 2017-03-26 DIAGNOSIS — E785 Hyperlipidemia, unspecified: Secondary | ICD-10-CM

## 2017-03-26 DIAGNOSIS — C44310 Basal cell carcinoma of skin of unspecified parts of face: Secondary | ICD-10-CM

## 2017-03-26 LAB — CBC WITH DIFFERENTIAL/PLATELET
BASOS PCT: 0.4 % (ref 0.0–3.0)
Basophils Absolute: 0 10*3/uL (ref 0.0–0.1)
EOS ABS: 0.2 10*3/uL (ref 0.0–0.7)
EOS PCT: 1.7 % (ref 0.0–5.0)
HCT: 48.7 % (ref 39.0–52.0)
Hemoglobin: 16.8 g/dL (ref 13.0–17.0)
Lymphocytes Relative: 12.4 % (ref 12.0–46.0)
Lymphs Abs: 1.2 10*3/uL (ref 0.7–4.0)
MCHC: 34.4 g/dL (ref 30.0–36.0)
MCV: 93.4 fl (ref 78.0–100.0)
Monocytes Absolute: 0.9 10*3/uL (ref 0.1–1.0)
Monocytes Relative: 9.5 % (ref 3.0–12.0)
Neutro Abs: 7.2 10*3/uL (ref 1.4–7.7)
Neutrophils Relative %: 76 % (ref 43.0–77.0)
Platelets: 206 10*3/uL (ref 150.0–400.0)
RBC: 5.22 Mil/uL (ref 4.22–5.81)
RDW: 13.3 % (ref 11.5–15.5)
WBC: 9.5 10*3/uL (ref 4.0–10.5)

## 2017-03-26 LAB — BASIC METABOLIC PANEL
BUN: 14 mg/dL (ref 6–23)
CHLORIDE: 107 meq/L (ref 96–112)
CO2: 24 mEq/L (ref 19–32)
Calcium: 9.6 mg/dL (ref 8.4–10.5)
Creatinine, Ser: 0.85 mg/dL (ref 0.40–1.50)
GFR: 93.19 mL/min (ref >60.00)
Glucose, Bld: 97 mg/dL (ref 70–99)
POTASSIUM: 4.2 meq/L (ref 3.5–5.1)
Sodium: 141 mEq/L (ref 135–145)

## 2017-03-26 LAB — HEPATIC FUNCTION PANEL
ALT: 10 U/L (ref 0–53)
AST: 18 U/L (ref 0–37)
Albumin: 4.5 g/dL (ref 3.5–5.2)
Alkaline Phosphatase: 95 U/L (ref 39–117)
BILIRUBIN TOTAL: 1 mg/dL (ref 0.2–1.2)
Bilirubin, Direct: 0.2 mg/dL (ref 0.0–0.3)
Total Protein: 7.1 g/dL (ref 6.0–8.3)

## 2017-03-26 LAB — PSA: PSA: 4.54 ng/mL — AB (ref 0.10–4.00)

## 2017-03-26 LAB — TSH: TSH: 1.83 u[IU]/mL (ref 0.35–4.50)

## 2017-03-26 MED ORDER — NITROGLYCERIN 0.4 MG SL SUBL: SUBLINGUAL_TABLET | SUBLINGUAL | 3 refills | Status: DC

## 2017-03-26 MED ORDER — ISOSORBIDE MONONITRATE ER 30 MG PO TB24: 30.0000 mg | ORAL_TABLET | Freq: Every day | ORAL | 3 refills | Status: DC

## 2017-03-26 NOTE — Progress Notes (Signed)
Subjective:  Patient ID: Mario Berg, male    DOB: [birth year]  Age: 77 y.o. MRN: [id number]  CC: No chief complaint on file.   HPI Mario Berg presents for HTN, GERD, CP w/probable CAD f/u  Outpatient Medications Prior to Visit  Medication Sig Dispense Refill  . aspirin EC 81 MG tablet Take 1 tablet (81 mg total) by mouth daily. 100 tablet 3  . Calcium Carb-Cholecalciferol (CALCIUM 600 + D PO) Take 1 tablet by mouth daily.    . Coenzyme Q10 (CO Q 10) 100 MG CAPS Take 100 mg by mouth daily.    . famotidine (PEPCID) 20 MG tablet Take 1 tablet (20 mg total) by mouth 2 (two) times daily. 20 tablet 0  . indomethacin (INDOCIN) 25 MG capsule Take 25 mg by mouth 2 (two) times daily as needed (gout flares).    . isosorbide mononitrate (IMDUR) 30 MG 24 hr tablet Take 1 tablet (30 mg total) by mouth daily. --patient needs to schedule appt before any further refills 45 tablet 0  . lisinopril (PRINIVIL,ZESTRIL) 20 MG tablet TAKE ONE TABLET BY MOUTH TWICE DAILY. 180 tablet 0  . lisinopril (PRINIVIL,ZESTRIL) 20 MG tablet TAKE ONE TABLET BY MOUTH TWICE DAILY 180 tablet 3  . Multiple Vitamin (MULTIVITAMIN) tablet Take 1 tablet by mouth daily.     . nitroGLYCERIN (NITROSTAT) 0.4 MG SL tablet PLACE 1 TABLET UNDER THE TONGUE EVERY 5 MINUTES AS NEEDED FOR CHEST PAIN 25 tablet 0  . Omega-3 Fatty Acids (FISH OIL) 1200 MG CAPS Take 1,200 mg by mouth daily.     Marland Kitchen Ophthalmic Irrigation Solution (EYE WASH) 99.05 % SOLN Apply 1 application to eye daily.    Marland Kitchen oxymetazoline (AFRIN) 0.05 % nasal spray Place 1 spray into both nostrils 2 (two) times daily as needed for congestion.    . vitamin E 400 UNIT capsule Take 400 Units by mouth daily.     No facility-administered medications prior to visit.     ROS Review of Systems  Constitutional: Positive for fatigue. Negative for appetite change and unexpected weight change.  HENT: Negative for congestion, nosebleeds, sneezing, sore throat and trouble swallowing.     Eyes: Negative for itching and visual disturbance.  Respiratory: Negative for cough.   Cardiovascular: Positive for chest pain. Negative for palpitations and leg swelling.  Gastrointestinal: Negative for abdominal distention, blood in stool, diarrhea and nausea.  Genitourinary: Negative for frequency and hematuria.  Musculoskeletal: Positive for arthralgias. Negative for back pain, gait problem, joint swelling and neck pain.  Skin: Negative for rash.  Neurological: Negative for dizziness, tremors, speech difficulty and weakness.  Hematological: Positive for adenopathy.  Psychiatric/Behavioral: Negative for agitation, dysphoric mood and sleep disturbance. The patient is not nervous/anxious.     Objective:  BP (!) 164/86 (BP Location: Left Arm, Patient Position: Sitting, Cuff Size: Large)   Pulse 71   Temp 97.9 F (36.6 C) (Oral)   Ht 5\' 8"  (1.727 m)   Wt 169 lb (76.7 kg)   SpO2 100%   BMI 25.70 kg/m   BP Readings from Last 3 Encounters:  03/26/17 (!) 164/86  01/16/16 (!) 182/102  09/16/15 (!) 190/98    Wt Readings from Last 3 Encounters:  03/26/17 169 lb (76.7 kg)  01/16/16 171 lb (77.6 kg)  09/16/15 171 lb (77.6 kg)    Physical Exam  Constitutional: He is oriented to person, place, and time. He appears well-developed. No distress.  NAD  HENT:  Mouth/Throat: Oropharynx is  clear and moist.  Eyes: Conjunctivae are normal. Pupils are equal, round, and reactive to light.  Neck: Normal range of motion. No JVD present. No thyromegaly present.  Cardiovascular: Normal rate, regular rhythm, normal heart sounds and intact distal pulses. Exam reveals no gallop and no friction rub.  No murmur heard. Pulmonary/Chest: Effort normal and breath sounds normal. No respiratory distress. He has no wheezes. He has no rales. He exhibits no tenderness.  Abdominal: Soft. Bowel sounds are normal. He exhibits no distension and no mass. There is no tenderness. There is no rebound and no guarding.   Musculoskeletal: Normal range of motion. He exhibits no edema or tenderness.  Lymphadenopathy:    He has no cervical adenopathy.  Neurological: He is alert and oriented to person, place, and time. He has normal reflexes. No cranial nerve deficit. He exhibits normal muscle tone. He displays a negative Romberg sign. Coordination and gait normal.  Skin: Skin is warm and dry. No rash noted.  Psychiatric: He has a normal mood and affect. His behavior is normal. Judgment and thought content normal.  L cervical mass: submandibular, a size of a small egg...  Lab Results  Component Value Date   WBC 8.4 07/08/2015   HGB 15.4 07/08/2015   HCT 44.3 07/08/2015   PLT 195 07/08/2015   GLUCOSE 101 (H) 09/13/2015   CHOL 222 (H) 07/08/2015   TRIG 157 (H) 07/08/2015   HDL 35 (L) 07/08/2015   LDLDIRECT 151.9 01/14/2012   LDLCALC 156 (H) 07/08/2015   ALT 16 (L) 07/08/2015   AST 22 07/08/2015   NA 141 09/13/2015   K 4.8 09/13/2015   CL 107 09/13/2015   CREATININE 1.03 09/13/2015   BUN 9 09/13/2015   CO2 28 09/13/2015   TSH 1.20 07/09/2014   PSA 6.84 (H) 09/13/2015   HGBA1C 5.6 11/28/2009    Dg Chest 2 View  Result Date: 07/07/2015 CLINICAL DATA:  Chest pain EXAM: CHEST  2 VIEW COMPARISON:  04/19/2011 FINDINGS: The heart size and mediastinal contours are within normal limits. Both lungs are clear. The visualized skeletal structures are unremarkable. IMPRESSION: No active cardiopulmonary disease. Electronically Signed   By: Inez Catalina M.D.   On: 07/07/2015 14:04    Assessment & Plan:   There are no diagnoses linked to this encounter. I am having Mario Berg. Mario Berg maintain his multivitamin, Fish Oil, vitamin E, Co Q 10, Calcium Carb-Cholecalciferol (CALCIUM 600 + D PO), EYE WASH, oxymetazoline, famotidine, lisinopril, indomethacin, aspirin EC, nitroGLYCERIN, lisinopril, and isosorbide mononitrate.  No orders of the defined types were placed in this encounter.    Follow-up: No Follow-up on  file.  Walker Kehr, MD

## 2017-03-26 NOTE — Assessment & Plan Note (Signed)
Multiple - R face/nose Scar on the L cheek F/u w/Dermatology

## 2017-03-26 NOTE — Assessment & Plan Note (Signed)
ASA Isosorbide

## 2017-03-26 NOTE — Assessment & Plan Note (Addendum)
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

## 2017-03-26 NOTE — Assessment & Plan Note (Signed)
Lisinopril 

## 2017-04-05 ENCOUNTER — Other Ambulatory Visit: Payer: Self-pay | Admitting: Otolaryngology

## 2017-04-05 DIAGNOSIS — R590 Localized enlarged lymph nodes: Secondary | ICD-10-CM | POA: Diagnosis not present

## 2017-04-05 DIAGNOSIS — R896 Abnormal cytological findings in specimens from other organs, systems and tissues: Secondary | ICD-10-CM | POA: Diagnosis not present

## 2017-04-09 ENCOUNTER — Other Ambulatory Visit: Payer: Self-pay | Admitting: Otolaryngology

## 2017-04-09 DIAGNOSIS — D487 Neoplasm of uncertain behavior of other specified sites: Secondary | ICD-10-CM

## 2017-04-18 ENCOUNTER — Ambulatory Visit
Admission: RE | Admit: 2017-04-18 | Discharge: 2017-04-18 | Disposition: A | Discharge status: Home / Self Care | Payer: Medicare Other | Source: Ambulatory Visit | Attending: Otolaryngology | Admitting: Otolaryngology

## 2017-04-18 DIAGNOSIS — D487 Neoplasm of uncertain behavior of other specified sites: Secondary | ICD-10-CM

## 2017-04-18 DIAGNOSIS — R221 Localized swelling, mass and lump, neck: Secondary | ICD-10-CM | POA: Diagnosis not present

## 2017-04-18 MED ORDER — IOPAMIDOL (ISOVUE-300) INJECTION 61%
75.0000 mL | Freq: Once | INTRAVENOUS | Status: AC | PRN
  Administered 2017-04-18: 75 mL via INTRAVENOUS

## 2017-04-25 DIAGNOSIS — H6123 Impacted cerumen, bilateral: Secondary | ICD-10-CM | POA: Diagnosis not present

## 2017-04-25 DIAGNOSIS — R590 Localized enlarged lymph nodes: Secondary | ICD-10-CM | POA: Diagnosis not present

## 2017-04-26 ENCOUNTER — Telehealth: Payer: Self-pay | Admitting: Internal Medicine

## 2017-04-26 NOTE — Telephone Encounter (Signed)
Requesting call back from Dr. Quintella Baton, Tue, Thurs and Friday can be reached 1pm to 5pm or wed 9am to 12pm.   Would like to review surgery that patient needs to have done.

## 2017-04-29 ENCOUNTER — Other Ambulatory Visit (HOSPITAL_COMMUNITY): Payer: Self-pay | Admitting: Otolaryngology

## 2017-04-29 DIAGNOSIS — C770 Secondary and unspecified malignant neoplasm of lymph nodes of head, face and neck: Secondary | ICD-10-CM

## 2017-04-29 NOTE — Telephone Encounter (Signed)
I called and left a VM

## 2017-04-30 NOTE — Telephone Encounter (Signed)
Please call Dr. Lucia Gaskins back between the hours listed below back at [phone]

## 2017-05-01 ENCOUNTER — Other Ambulatory Visit: Payer: Self-pay | Admitting: *Deleted

## 2017-05-01 ENCOUNTER — Telehealth: Payer: Self-pay | Admitting: *Deleted

## 2017-05-01 ENCOUNTER — Encounter: Payer: Self-pay | Admitting: Radiation Oncology

## 2017-05-01 DIAGNOSIS — C760 Malignant neoplasm of head, face and neck: Secondary | ICD-10-CM

## 2017-05-01 NOTE — Telephone Encounter (Signed)
Oncology Nurse Navigator Documentation  Placed introductory call to new referral patient Mario Berg.  LVMM requesting call-back.  Gayleen Orem, RN, BSN Head & Neck Oncology Nurse [place] at [place] [phone]

## 2017-05-01 NOTE — Telephone Encounter (Signed)
Done

## 2017-05-02 ENCOUNTER — Telehealth: Payer: Self-pay | Admitting: *Deleted

## 2017-05-02 NOTE — Telephone Encounter (Signed)
Oncology Nurse Navigator Documentation  Pt returned call.    Introduced myself as the H&N oncology nurse navigator that works with Dr. Isidore Moos to whom he has been referred by Dr. Lucia Gaskins.  He confirmed understanding of referral; I informed him of 3/5 11:30 NE/12:00 consult with Dr. Isidore Moos.    I briefly explained my role as his navigator, indicated I would be joining him during his appt next week.  I confirmed his understanding of [place] location, explained arrival and RadOnc registration process for appt.  We discussed purpose of PET scheduled for tomorrow 1:00 with 12:30 arrival to [facility].  I reviewed NPO requirements, provided directions.  I provided my contact information, encouraged him to call with questions/concerns before next week.  He verbalized understanding of information provided, expressed appreciation for my call.  Gayleen Orem, RN, BSN Head & Neck Oncology Nurse [place] at [place] [phone]

## 2017-05-02 NOTE — Progress Notes (Addendum)
Head and Neck Cancer Location of Tumor / Histology:  04/05/17 Diagnosis ATYPICAL CELLS PRESENT  Patient presented  months ago with symptoms of: He had a "knot" over his Left neck area for over one year.   Biopsies of left neck mass revealed: atypical cells  Nutrition Status Yes No Comments  Weight changes? []  [x]    Swallowing concerns? []  [x]  He denies problems swallowing. He does report thick phlegm.   PEG? []  [x]     Referrals Yes No Comments  Social Work? []  [x]    Dentistry? []  [x]    Swallowing therapy? []  [x]    Nutrition? []  [x]    Med/Onc? []  [x]     Safety Issues Yes No Comments  Prior radiation? []  [x]    Pacemaker/ICD? []  [x]    Possible current pregnancy? []  [x]    Is the patient on methotrexate? []  [x]     Tobacco/Marijuana/Snuff/ETOH use: He is a former smoker, quitting in 24. He does smoke marijuana nightly to help him fall asleep. He reports that he drinks 15-20 beers weekly.   Past/Anticipated interventions by otolaryngology, if any:  FNA by Dr. Lucia Gaskins 04/05/17  Past/Anticipated interventions by medical oncology, if any:    Current Complaints / other details:   05/03/17 PET IMPRESSION: 1. The left neck mass just below the parotid gland and superficial to the sternocleidomastoid muscle is hypermetabolic with maximum SUV of 12.0. No other definite focus of abnormal metabolic activity is identified in the neck, or in the remainder of the head, chest, abdomen, pelvis, or extremities. 2. Other imaging findings of potential clinical significance: Aortic Atherosclerosis (ICD10-I70.0). Coronary atherosclerosis. Colonic diverticulosis. Bilateral pars defects at L5. Ossicle along the left tibial tubercle compatible with prior Osgood-Schlatter disease, with some accentuated activity compatible with active inflammation (there is also a small left knee joint effusion).   04/18/17 CT neck IMPRESSION: 1. Heterogeneous mass at the inferior tip of the left parotid  gland, superficial to the left sternocleidomastoid muscle. The mass appears separate from the parotid gland and is favored to be a metastatic lymph node from an occult head and neck lesion. A primary parotid lesion is less likely. Histologic sampling and endoscopic evaluation is recommended. 2. Asymmetrically dilated right piriform sinus with relative medialization of the right aryepiglottic fold may indicate paralysis of the right vocal cord. 3. No other cervical lymphadenopathy or soft tissue mass.  BP (!) 200/98 (BP Location: Left Arm) Comment: manual, pt would not let me pump cuff >200.  Pulse 70   Temp 97.9 F (36.6 C)   Ht 5\' 8"  (1.727 m)   Wt 169 lb 3.2 oz (76.7 kg)   SpO2 93% Comment: room air  BMI 25.73 kg/m   Wt Readings from Last 3 Encounters:  05/07/17 169 lb 3.2 oz (76.7 kg)  03/26/17 169 lb (76.7 kg)  01/16/16 171 lb (77.6 kg)

## 2017-05-03 ENCOUNTER — Ambulatory Visit (HOSPITAL_COMMUNITY)
Admission: RE | Admit: 2017-05-03 | Discharge: 2017-05-03 | Disposition: A | Discharge status: Home / Self Care | Payer: Medicare Other | Source: Ambulatory Visit | Attending: Otolaryngology | Admitting: Otolaryngology

## 2017-05-03 DIAGNOSIS — R221 Localized swelling, mass and lump, neck: Secondary | ICD-10-CM | POA: Diagnosis not present

## 2017-05-03 DIAGNOSIS — M25462 Effusion, left knee: Secondary | ICD-10-CM | POA: Insufficient documentation

## 2017-05-03 DIAGNOSIS — Z882 Allergy status to sulfonamides status: Secondary | ICD-10-CM | POA: Insufficient documentation

## 2017-05-03 DIAGNOSIS — C7A8 Other malignant neuroendocrine tumors: Secondary | ICD-10-CM | POA: Diagnosis not present

## 2017-05-03 DIAGNOSIS — C760 Malignant neoplasm of head, face and neck: Secondary | ICD-10-CM | POA: Diagnosis not present

## 2017-05-03 DIAGNOSIS — Z880 Allergy status to penicillin: Secondary | ICD-10-CM | POA: Insufficient documentation

## 2017-05-03 DIAGNOSIS — I2510 Atherosclerotic heart disease of native coronary artery without angina pectoris: Secondary | ICD-10-CM | POA: Insufficient documentation

## 2017-05-03 DIAGNOSIS — I700 Atherosclerosis of aorta: Secondary | ICD-10-CM | POA: Insufficient documentation

## 2017-05-03 DIAGNOSIS — C770 Secondary and unspecified malignant neoplasm of lymph nodes of head, face and neck: Secondary | ICD-10-CM

## 2017-05-03 LAB — GLUCOSE, CAPILLARY: GLUCOSE-CAPILLARY: 111 mg/dL — AB (ref 65–99)

## 2017-05-03 MED ORDER — FLUDEOXYGLUCOSE F - 18 (FDG) INJECTION
8.2000 | Freq: Once | INTRAVENOUS | Status: AC | PRN
  Administered 2017-05-03: 8.2 via INTRAVENOUS

## 2017-05-07 ENCOUNTER — Encounter: Payer: Self-pay | Admitting: Radiation Oncology

## 2017-05-07 ENCOUNTER — Encounter: Payer: Self-pay | Admitting: *Deleted

## 2017-05-07 ENCOUNTER — Ambulatory Visit
Admission: RE | Admit: 2017-05-07 | Discharge: 2017-05-07 | Disposition: A | Discharge status: Home / Self Care | Payer: Medicare Other | Source: Ambulatory Visit | Attending: Radiation Oncology | Admitting: Radiation Oncology

## 2017-05-07 ENCOUNTER — Encounter: Payer: Self-pay | Admitting: General Practice

## 2017-05-07 VITALS — BP 200/98 | HR 70 | Temp 97.9°F | Ht 68.0 in | Wt 169.2 lb

## 2017-05-07 DIAGNOSIS — Z7982 Long term (current) use of aspirin: Secondary | ICD-10-CM | POA: Insufficient documentation

## 2017-05-07 DIAGNOSIS — Z79899 Other long term (current) drug therapy: Secondary | ICD-10-CM | POA: Diagnosis not present

## 2017-05-07 DIAGNOSIS — Z91013 Allergy to seafood: Secondary | ICD-10-CM | POA: Diagnosis not present

## 2017-05-07 DIAGNOSIS — Z888 Allergy status to other drugs, medicaments and biological substances status: Secondary | ICD-10-CM | POA: Insufficient documentation

## 2017-05-07 DIAGNOSIS — R221 Localized swelling, mass and lump, neck: Secondary | ICD-10-CM | POA: Diagnosis not present

## 2017-05-07 DIAGNOSIS — C44310 Basal cell carcinoma of skin of unspecified parts of face: Secondary | ICD-10-CM | POA: Diagnosis not present

## 2017-05-07 DIAGNOSIS — Z880 Allergy status to penicillin: Secondary | ICD-10-CM | POA: Diagnosis not present

## 2017-05-07 DIAGNOSIS — Z882 Allergy status to sulfonamides status: Secondary | ICD-10-CM | POA: Diagnosis not present

## 2017-05-07 DIAGNOSIS — E785 Hyperlipidemia, unspecified: Secondary | ICD-10-CM | POA: Insufficient documentation

## 2017-05-07 DIAGNOSIS — C770 Secondary and unspecified malignant neoplasm of lymph nodes of head, face and neck: Secondary | ICD-10-CM

## 2017-05-07 DIAGNOSIS — Z85828 Personal history of other malignant neoplasm of skin: Secondary | ICD-10-CM | POA: Diagnosis not present

## 2017-05-07 DIAGNOSIS — Z87891 Personal history of nicotine dependence: Secondary | ICD-10-CM | POA: Insufficient documentation

## 2017-05-07 DIAGNOSIS — I10 Essential (primary) hypertension: Secondary | ICD-10-CM | POA: Diagnosis not present

## 2017-05-07 NOTE — Progress Notes (Addendum)
Radiation Oncology         [phone] ________________________________  Initial Outpatient Consultation  Name: Mario Berg MRN: [id number]  Date: 05/07/2017  DOB: [birth year]  IR:JJOACZYSA, [name], MD  Rozetta Nunnery, *   REFERRING PHYSICIAN: Rozetta Nunnery, *  DIAGNOSIS:    ICD-10-CM   1. Basal cell carcinoma, face C44.310 Ambulatory referral to Social Work  2. 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 male who initially presented to his PCP, Dr. Alain Marion, on 03/26/2017 with a lump in the left submandibular region. It does not cause pain. He first noted this well over a year ago and has gradually gotten larger.   Subsequently, the patient saw Dr. Lucia Gaskins on 04/05/2017 who performed FNA biopsy of the left neck mass which revealed atypical cells. The strong CD56 and possible weak staining with neuroendocrine markers raises the possibility of a neuroendocrine carcinoma.  Pertinent imaging thus far includes neck CT performed on 04/18/2017 revealing a heterogeneous mass at the inferior tip of the left parotid gland, superficial to the left sternocleidomastoid muscle. The mass appeared separate from the parotid gland and was favored to be a metastatic lymph node from an occult head and neck lesion. PET scan on 05/03/2017 showed the known left neck mass just below the parotid gland and superficial to the sternocleidomastoid muscle. No other definite metabolic activity in the neck, remained of head, chest, abdomen, pelvis, or extremities. I have personally reviewed these images.  Swallowing issues, if any: Denies. He does report thick phlegm.  Weight Changes: Denies  Pain status: Denies  Tobacco history, if any: Former smoker, quit  29 years ago. He does smoke marijuana every night to help him fall asleep.  ETOH abuse, if any: Reports he drinks 15-20 beers per week.  Prior cancers, if any: He's had previous basal cell carcinoma skin cancers removed by dermatology over the past few years including large skin cancers from the left cheek over 3 years ago at the skin cancer center. Dr. Lucia Gaskins noted some lesions over the patient's forehead that may represent skin cancers when he examined him last month. He reports he has had the lesions on his right cheek for 20-30 years.   The patient has been referred today for discussion of potential radiation treatment options. We are joined in consultation by Gayleen Orem, RN, our Head and Neck Navigator.  On review of systems, the patient reports severe tinnitus.  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    PAST SURGICAL HISTORY: Past Surgical History:  Procedure Laterality Date  . HEMORRHOID SURGERY  05/17/2011   Procedure: HEMORRHOIDECTOMY PROLAPSED;  Surgeon: Adin Hector, MD;  Location: [facility];  Service: General;  Laterality: N/A;  [place] Hemorrhoidal Ligation [place]   . HEMORRHOID SURGERY  2013   Dr [name]  . SKIN CANCER EXCISION  12-21-2006   Nose  .  [facility]     05/17/2011    FAMILY HISTORY: family history includes Heart failure in his father and mother; Hypertension in his father and mother.  SOCIAL HISTORY:  reports that he quit smoking about 28 years ago. His smoking use included cigarettes. He has a 44.00  pack-year smoking history. he has never used smokeless tobacco. He reports that he drinks about 12.0 oz of alcohol per week. He reports that he uses drugs. Drug: Marijuana.  ALLERGIES: Penicillins; Atorvastatin; Furosemide; Bystolic [nebivolol hcl]; Clams [shellfish allergy]; Clonidine derivatives; Gemfibrozil; Metoprolol tartrate; Sulfonamide derivatives; Tekturna [aliskiren fumarate]; Terazosin hcl; and Verapamil  MEDICATIONS:  Current Outpatient Medications  Medication Sig Dispense Refill  . aspirin EC 81 MG tablet Take 1 tablet (81 mg total) by mouth daily. 100 tablet 3  . Calcium Carb-Cholecalciferol (CALCIUM 600 + D PO) Take 1 tablet by mouth daily.    . Coenzyme Q10 (CO Q 10) 100 MG CAPS Take 100 mg by mouth daily.    . indomethacin (INDOCIN) 25 MG capsule Take 25 mg by mouth 2 (two) times daily as needed (gout flares).    . isosorbide mononitrate (IMDUR) 30 MG 24 hr tablet Take 1 tablet (30 mg total) by mouth daily. 90 tablet 3  . lisinopril (PRINIVIL,ZESTRIL) 20 MG tablet TAKE ONE TABLET BY MOUTH TWICE DAILY 180 tablet 3  . Multiple Vitamin (MULTIVITAMIN) tablet Take 1 tablet by mouth daily.     . nitroGLYCERIN (NITROSTAT) 0.4 MG SL tablet PLACE 1 TABLET UNDER THE TONGUE EVERY 5 MINUTES AS NEEDED FOR CHEST PAIN 20 tablet 3  . Omega-3 Fatty Acids (FISH OIL) 1200 MG CAPS Take 1,200 mg by mouth daily.     Marland Kitchen Ophthalmic Irrigation Solution (EYE WASH) 99.05 % SOLN Apply 1 application to eye daily.    Marland Kitchen oxymetazoline (AFRIN) 0.05 % nasal spray Place 1 spray into both nostrils 2 (two) times daily as needed for congestion.     No current facility-administered medications for this encounter.     REVIEW OF SYSTEMS:  A 10+ POINT REVIEW OF SYSTEMS WAS OBTAINED including neurology, dermatology, psychiatry, cardiac, respiratory, lymph, extremities, GI, GU, Musculoskeletal, constitutional,  HEENT.  All pertinent positives are noted in the HPI.  All others are negative.    PHYSICAL EXAM:  height is  5\' 8"  (1.727 m) and weight is 169 lb 3.2 oz (76.7 kg). His temperature is 97.9 F (36.6 C). His blood pressure is 200/98 (abnormal) and his pulse is 70. His oxygen saturation is 93%.   General: Alert and oriented, in no acute distress. HEENT: He has a raised crusted lesion over the vertex of his scalp on the right side about 1 cm in dimension. He has some erythema and crusting over the right cheek bone which may be cancerous or pre-cancerous. On the right forehead he has an erythematous draining lesion that is about 1 cm in dimension. He has a papular pearly lesion on the right nose just anterior to the nasal crease, suspicious for skin cancer.  Extraocular movements are intact. Upper and lower dentures removed for oral exam. Oropharynx is clear. Neck: Neck is notable for a mobile mass in the level II region of the left neck which is about 6 cm in greatest dimension. No other lesions in the pre- or post-auricular regions. No lesions otherwise in the cervical or supraclavicular regions. Heart: Regular in rate and rhythm with no murmurs, rubs, or gallops. Chest: Clear to auscultation bilaterally, with no rhonchi, wheezes, or rales. Abdomen: Soft, nontender,  nondistended, with no rigidity or guarding. Extremities: No cyanosis or edema. Lymphatics: see Neck Exam Skin: see HEENT Musculoskeletal: Symmetric strength and muscle tone throughout. Neurologic: Cranial nerves II through XII are grossly intact. No obvious focalities. Speech is fluent. Coordination is intact. Finger-to-nose testing grossly intact. Psychiatric: Judgment and insight are intact. Affect is appropriate.   ECOG = 1  0 - Asymptomatic (Fully active, able to carry on all predisease activities without restriction)  1 - Symptomatic but completely ambulatory (Restricted in physically strenuous activity but ambulatory and able to carry out work of a light or sedentary nature. For example, light housework, office work)  2 - Symptomatic,  <50% in bed during the day (Ambulatory and capable of all self care but unable to carry out any work activities. Up and about more than 50% of waking hours)  3 - Symptomatic, >50% in bed, but not bedbound (Capable of only limited self-care, confined to bed or chair 50% or more of waking hours)  4 - Bedbound (Completely disabled. Cannot carry on any self-care. Totally confined to bed or chair)  5 - Death   Eustace Pen MM, Creech RH, Tormey DC, et al. [phone]). "Toxicity and response criteria of the [facility]". [place] Oncol. 5 (6): 649-55   LABORATORY DATA:  Lab Results  Component Value Date   WBC 9.5 03/26/2017   HGB 16.8 03/26/2017   HCT 48.7 03/26/2017   MCV 93.4 03/26/2017   PLT 206.0 03/26/2017   CMP     Component Value Date/Time   NA 141 03/26/2017 1605   K 4.2 03/26/2017 1605   CL 107 03/26/2017 1605   CO2 24 03/26/2017 1605   GLUCOSE 97 03/26/2017 1605   BUN 14 03/26/2017 1605   CREATININE 0.85 03/26/2017 1605   CALCIUM 9.6 03/26/2017 1605   PROT 7.1 03/26/2017 1605   ALBUMIN 4.5 03/26/2017 1605   AST 18 03/26/2017 1605   ALT 10 03/26/2017 1605   ALKPHOS 95 03/26/2017 1605   BILITOT 1.0 03/26/2017 1605   GFRNONAA >60 07/08/2015 0102   GFRAA >60 07/08/2015 0102         RADIOGRAPHY: I personally reviewed the images from his Neck CT and PET.  Ct Soft Tissue Neck W Contrast  Result Date: 04/18/2017 CLINICAL DATA:  Left-sided neck lump for 1 year. Hoarseness. Former smoker. EXAM: CT NECK WITH CONTRAST TECHNIQUE: Multidetector CT imaging of the neck was performed using the standard protocol following the bolus administration of intravenous contrast. CONTRAST:  49mL ISOVUE-300 IOPAMIDOL (ISOVUE-300) INJECTION 61% COMPARISON:  None. FINDINGS: Pharynx and larynx: --Nasopharynx: Fossae of Rosenmuller are clear. Normal adenoid tonsils for age. --Oral cavity and oropharynx: The palatine and lingual tonsils are normal. The visible oral cavity and floor of  mouth are normal. --Hypopharynx: Normal vallecula and pyriform sinuses. --Larynx: Normal epiglottis and pre-epiglottic space. There is asymmetric widening of the right piriform sinus with medialization of the right aryepiglottic fold. --Retropharyngeal space: No abscess, effusion or lymphadenopathy. Salivary glands: --Parotid: The left parotid gland abuts the mass described below. The gland appears to be separate from the mass. The right parotid gland is normal. --Submandibular: Symmetric without inflammation. No sialolithiasis or ductal dilatation. --Sublingual: Normal. No ranula or other visible lesion of the base of tongue and floor of mouth. Thyroid: Normal. Lymph nodes: Superficial to the left sternocleidomastoid muscle and at the inferior margin of the left parotid gland, there is a heterogeneous mass that measures approximately 3.7 x 3.0 cm. There are no other  cervical soft tissue masses or enlarged lymph nodes. Vascular: There is calcific aortic atherosclerosis. There is a normal variant aortic arch branching pattern with the brachiocephalic and left common carotid arteries sharing a common origin. Major cervical vessels are patent. Limited intracranial: Normal. Visualized orbits: Normal. Mastoids and visualized paranasal sinuses: No fluid levels or advanced mucosal thickening. No mastoid effusion. Skeleton: No bony spinal canal stenosis. No lytic or blastic lesions. Upper chest: Clear. Other: None. IMPRESSION: 1. Heterogeneous mass at the inferior tip of the left parotid gland, superficial to the left sternocleidomastoid muscle. The mass appears separate from the parotid gland and is favored to be a metastatic lymph node from an occult head and neck lesion. A primary parotid lesion is less likely. Histologic sampling and endoscopic evaluation is recommended. 2. Asymmetrically dilated right piriform sinus with relative medialization of the right aryepiglottic fold may indicate paralysis of the right vocal  cord. 3. No other cervical lymphadenopathy or soft tissue mass. Electronically Signed   By: Ulyses Jarred M.D.   On: 04/18/2017 19:33   Nm Pet Image Initial (pi) Whole Body  Result Date: 05/03/2017 CLINICAL DATA:  Initial treatment strategy for head and neck cancer. Mass along the left parotid gland. EXAM: NUCLEAR MEDICINE PET WHOLE BODY TECHNIQUE: 8.2 mCi F-18 FDG was injected intravenously. Full-ring PET imaging was performed from the skull base to thigh after the radiotracer. CT data was obtained and used for attenuation correction and anatomic localization. Fasting blood glucose: 111 mg/dl Mediastinal blood pool activity: SUV max 2.3 COMPARISON:  04/18/2017 FINDINGS: HEAD/NECK: The left neck mass or enlarged lateral cervical node extending just below the left parotid gland and superficial to the sternocleidomastoid measures 3.7 by 2.7 cm on image 52/4 and has a maximum standard uptake value of 12.0 Incidental CT findings: Chronic ethmoid and right sphenoid sinusitis. It is possible that this is arising from the lower margin of the parotid gland although the epicenter of the lesion is outside of the parotid. As on the prior CT there is some accentuation of the right piriform sinus compared to the left along with medial deviation of the right aryepiglottic fold, query vocal cord paralysis. No significant abnormal laryngeal activity is identified. If there is a separate primary in the neck, it is not exhibiting accentuated metabolic activity at this time. CHEST: No significant abnormal hypermetabolic activity in the chest. Incidental CT findings: No worrisome pulmonary nodule. Coronary, aortic arch, and branch vessel atherosclerotic vascular disease. Mild cardiomegaly. ABDOMEN/PELVIS: No significant hypermetabolic activity in the liver, spleen, pancreas, or adrenal glands. No hypermetabolic adenopathy or other significant hypermetabolic lesion in the abdomen. Incidental CT findings: Colonic diverticulosis most  concentrated in the sigmoid colon. Aortoiliac atherosclerotic vascular disease. Mild prostatomegaly. SKELETON: Mildly accentuated activity along accessory ossicles in the distal patellar tendon, believed to be inflammatory, maximum SUV 4.2. Appearance suggests prior Osgood-Schlatter disease potentially with some active inflammation. There is also a small left knee effusion. Incidental CT findings: L3 vertebral hemangioma. Bilateral pars defects at L5 EXTREMITIES: No significant findings. Incidental CT findings: none IMPRESSION: 1. The left neck mass just below the parotid gland and superficial to the sternocleidomastoid muscle is hypermetabolic with maximum SUV of 12.0. No other definite focus of abnormal metabolic activity is identified in the neck, or in the remainder of the head, chest, abdomen, pelvis, or extremities. 2. Other imaging findings of potential clinical significance: Aortic Atherosclerosis (ICD10-I70.0). Coronary atherosclerosis. Colonic diverticulosis. Bilateral pars defects at L5. Ossicle along the left tibial tubercle compatible with prior  Osgood-Schlatter disease, with some accentuated activity compatible with active inflammation (there is also a small left knee joint effusion). Electronically Signed   By: Van Clines M.D.   On: 05/03/2017 15:21      IMPRESSION/PLAN: Left neck mass, suspect metastatic from skin cancer, Merkel Cell is in the differential.  Will discuss tomorrow at tumor board and recommend Dr Lucia Gaskins see patient again to discuss surgical resection.  This is a delightful patient with head and neck cancer and he needs the neck mass to be resected for the best prognosis. I do recommend about 6 weeks of adjuvant radiotherapy to the neck following a resection of the mass. He will be discussed tomorrow at tumor board. I recommended he discuss treatment options for the lesions on his face with his dermatologist, but he is reluctant to do so. At this point, he is hesitant about  receiving any treatment at all. If he does decides to forego surgery, I recommend 7 weeks of radiotherapy to the neck. No guarantees of treatment were given. A consent form was signed and placed in the patient's medical record.   We discussed the potential risks, benefits, and side effects of radiotherapy. We talked in detail about acute and late effects. We discussed that some of the most bothersome acute effects may be mucositis, dysgeusia, salivary changes, skin irritation, hair loss, dehydration, weight loss and fatigue. We talked about late effects which include but are not necessarily limited to dysphagia, hypothyroidism, nerve injury, spinal cord injury, xerostomia,  and neck edema.   I will await his referral back by Dr. Lucia Gaskins.    Chronic high BP - asymptomatic:  I defer to patient's PCP to manage this. __________________________________________   Eppie Gibson, MD  This document serves as a record of services personally performed by Eppie Gibson, MD. It was created on her behalf by Rae Lips, a trained medical scribe. The creation of this record is based on the scribe's personal observations and the provider's statements to them. This document has been checked and approved by the attending provider.

## 2017-05-07 NOTE — Progress Notes (Signed)
[place] Psychosocial Distress Screening Clinical Social Work  Clinical Social Work was referred by distress screening protocol.  The patient scored a 5 on the Psychosocial Distress Thermometer which indicates moderate distress. Clinical Social Worker Edwyna Shell to assess for distress and other psychosocial needs.   CSW and patient discussed common feeling and emotions when being diagnosed with cancer, and the importance of support during treatment. CSW informed patient of the support team and support services at [facility]. CSW provided contact information and encouraged patient to call with any questions or concerns.  Patient uncertain about what he will need - lives alone, has concerns about maintaining adequate nutrition while undergoing treatment. "I eat two meals/day and keep my life simple.  I know I may need to change some things while Im in treatment and don't know exactly what to do."  CSW will notify nurse navigator.  Patient uncertain about what his insurance will pay and whether he will have financial difficulties while in treatment.  Uncertain about appointment schedule and treatment plan.  CSW indicated that patient will likely meet ancillary treatment team during upcoming clinic and will have opportunity to ask questions as needed.  Will make information packet about Support Center.  Encouraged patient to contact CSW as needed.      ONCBCN DISTRESS SCREENING 05/07/2017  Screening Type Initial Screening  Distress experienced in past week (1-10) 6  Practical problem type Food  Emotional problem type Nervousness/Anxiety;Isolation/feeling alone  Spiritual/Religous concerns type Loss of sense of purpose  Physical Problem type Bathing/dressing;Mouth sores/swallowing;Loss of appetitie;Constipation/diarrhea;Swollen arms/legs    Clinical Social Worker follow up needed: Yes.    If yes, follow up plan:  Will be seen by CSW  In Head and Neck Clinic

## 2017-05-08 ENCOUNTER — Encounter: Payer: Self-pay | Admitting: Radiation Oncology

## 2017-05-08 DIAGNOSIS — C770 Secondary and unspecified malignant neoplasm of lymph nodes of head, face and neck: Secondary | ICD-10-CM | POA: Insufficient documentation

## 2017-05-11 NOTE — Progress Notes (Signed)
Oncology Nurse Navigator Documentation  Met with Mario Berg during initial consult with Dr. Isidore Moos.  He was unaccompanied.     Further introduced myself as his Navigator, explained my role as a member of the Care Team.    Provided introductory explanation of radiation treatment including SIM planning and purpose of Aquaplast head and shoulder mask, showed him example.    He voiced understanding surgery followed by adjuvant RT proposed plan.  He indicated undecided if he wants to proceed with surgery or any tmt.  Dr. Isidore Moos encouraged consideration of tmt.  He indicated he will consider pending further discussion with Dr. Lucia Gaskins.   I encouraged him to contact me with questions/concerns as treatments/procedures begin.  He verbalized understanding of information provided.    Gayleen Orem, RN, BSN Head & Neck Oncology Nurse [facility] [place] [phone]

## 2017-05-17 ENCOUNTER — Other Ambulatory Visit: Payer: Self-pay | Admitting: Cardiovascular Disease

## 2017-05-17 NOTE — Telephone Encounter (Signed)
Should this be deferred to patients pcp? Please advise. Thanks, MI 

## 2017-05-20 ENCOUNTER — Ambulatory Visit: Payer: Self-pay

## 2017-05-20 NOTE — Telephone Encounter (Signed)
Pt. Called to report coughing and wheezing for the past 12 days.  Stated the cough is mostly nonproductive, but occasionally coughs up "dark yellow phlegm."   Reported his cough and wheezing was severe, but is mild to moderate now.  Stated also has nasal congestion. Denied chest discomfort or shortness of breath.  Denied fever.  Also, c/o night sweats, where he wakes up "drenched in sweat."  Pt. stated he has "extreme fatigue."  Appt. scheduled 3/19 with PCP office.  Care advice given per protocol.  Verb. Understanding.      Reason for Disposition . SEVERE coughing spells (e.g., whooping sound after coughing, vomiting after coughing)    Reported the cough and wheezing were severe, but now is mild to moderate; c/o night sweats; denied fever.  Answer Assessment - Initial Assessment Questions 1. ONSET: "When did the cough begin?"      12 days ago  2. SEVERITY: "How bad is the cough today?"      Has decreased in severity; mild to moderate now 3. RESPIRATORY DISTRESS: "Describe your breathing."      Denied shortness of breath 4. FEVER: "Do you have a fever?" If so, ask: "What is your temperature, how was it measured, and when did it start?"     no 5. HEMOPTYSIS: "Are you coughing up any blood?" If so ask: "How much?" (flecks, streaks, tablespoons, etc.)     Dark yellow; no red streaks since stopped using a red cough drop 6. TREATMENT: "What have you done so far to treat the cough?" (e.g., meds, fluids, humidifier)     Tried Mucinex 7. CARDIAC HISTORY: "Do you have any history of heart disease?" (e.g., heart attack, congestive heart failure)      Angina 8. LUNG HISTORY: "Do you have any history of lung disease?"  (e.g., pulmonary embolus, asthma, emphysema)     No hx asthma, or COPD 9. PE RISK FACTORS: "Do you have a history of blood clots?" (or: recent major surgery, recent prolonged travel, bedridden )    n/a 10. OTHER SYMPTOMS: "Do you have any other symptoms? (e.g., runny nose, wheezing, chest  pain)       Extreme fatigue; night sweats; wheezing; nasal congestion  11. PREGNANCY: "Is there any chance you are pregnant?" "When was your last menstrual period?"       N/A 12. TRAVEL: "Have you traveled out of the country in the last month?" (e.g., travel history, exposures)       no  Protocols used: COUGH - ACUTE NON-PRODUCTIVE-A-AH

## 2017-05-21 ENCOUNTER — Ambulatory Visit (INDEPENDENT_AMBULATORY_CARE_PROVIDER_SITE_OTHER)
Admission: RE | Admit: 2017-05-21 | Discharge: 2017-05-21 | Disposition: A | Discharge status: Home / Self Care | Payer: Medicare Other | Source: Ambulatory Visit | Attending: Family Medicine | Admitting: Family Medicine

## 2017-05-21 ENCOUNTER — Ambulatory Visit (INDEPENDENT_AMBULATORY_CARE_PROVIDER_SITE_OTHER): Payer: Medicare Other | Admitting: Family Medicine

## 2017-05-21 ENCOUNTER — Other Ambulatory Visit: Payer: Self-pay | Admitting: Cardiovascular Disease

## 2017-05-21 ENCOUNTER — Encounter: Payer: Self-pay | Admitting: Family Medicine

## 2017-05-21 VITALS — BP 206/110 | HR 73 | Temp 98.3°F | Ht 68.0 in | Wt 166.0 lb

## 2017-05-21 DIAGNOSIS — J069 Acute upper respiratory infection, unspecified: Secondary | ICD-10-CM | POA: Diagnosis not present

## 2017-05-21 DIAGNOSIS — I10 Essential (primary) hypertension: Secondary | ICD-10-CM | POA: Diagnosis not present

## 2017-05-21 DIAGNOSIS — R05 Cough: Secondary | ICD-10-CM

## 2017-05-21 DIAGNOSIS — R059 Cough, unspecified: Secondary | ICD-10-CM

## 2017-05-21 NOTE — Progress Notes (Signed)
Mario Berg - 77 y.o. male MRN [id number]  Date of birth: 11/08/1940  SUBJECTIVE:  Including CC & ROS.  Chief Complaint  Patient presents with  . Cough    Mario Berg is a 77 y.o. male that is presenting with a cough. Ongoing for two weeks. Admits to productive cough and green/brownish mucous. Denies body aches or fevers. Denies shortness of breath. Admits to wheezing. Worse at night. He has been taking mucinex and cough drops with no improvement. Feels like his symptoms have gotten worse.   HTN: long history of uncontrolled and intolerant to medications. Denies any numbness or trouble speaking. Takes lisinopril   Review of Systems  Constitutional: Negative for fever.  HENT: Positive for congestion.   Respiratory: Positive for cough.   Cardiovascular: Negative for chest pain.  Gastrointestinal: Negative for abdominal pain.  Neurological: Negative for facial asymmetry, weakness and headaches.  Hematological: Negative for adenopathy.  Psychiatric/Behavioral: Negative for agitation.    HISTORY: Past Medical, Surgical, Social, and Family History Reviewed & Updated per EMR.   Pertinent Historical Findings include:  Past Medical History:  Diagnosis Date  . Adenomatous polyp 04/2007  . BPH (benign prostatic hyperplasia)   . DIVERTICULITIS OF COLON 05/06/2007  . Diverticulitis, colon   . Diverticulosis of colon   . Gout   . Gout   . Hemorrhoids   . Hyperlipidemia   . Hypertension   . IBS (irritable bowel syndrome)    Dr. Fuller Plan  C/D  . LBP (low back pain)   . Merkel cell skin cancer of cheek ([place])   . Nasal congestion   . OA (osteoarthritis)   . Rectal bleeding   . Shortness of breath    [place]  . Squamous cell cancer of skin of left cheek   . Umbilical hernia    NO PAIN-BUT PT WORRIES ABOUT IT--WEARS A BACK BRACE FOR SUPPORT AT TIMES    Past Surgical History:  Procedure Laterality Date  . HEMORRHOID SURGERY  05/17/2011   Procedure: HEMORRHOIDECTOMY PROLAPSED;   Surgeon: Adin Hector, MD;  Location: [facility];  Service: General;  Laterality: N/A;  [place] Hemorrhoidal Ligation Pexy   . HEMORRHOID SURGERY  2013   Dr [name]  . SKIN CANCER EXCISION  12-21-2006   Nose  . [facility]     05/17/2011    Allergies  Allergen Reactions  . Penicillins Hives and Swelling    Swelling of the hands  Has patient had a PCN reaction causing immediate rash, facial/tongue/throat swelling, SOB or lightheadedness with hypotension: Yes Has patient had a PCN reaction causing severe rash involving mucus membranes or skin necrosis: Yes Has patient had a PCN reaction that required hospitalization No Has patient had a PCN reaction occurring within the last 10 years: No If all of the above answers are "NO", then may proceed with Cephalosporin use.   . Atorvastatin Other (See Comments)    Diverticulosis per patient  . Furosemide Other (See Comments)    Diverticulosis per patient  . Bystolic [Nebivolol Hcl] Other (See Comments)    Bladder burning  . Clams [employer] Allergy] Other (See Comments)    Poison the body   . Clonidine Derivatives Other (See Comments)    weak  . Gemfibrozil Other (See Comments)    Diverticulitis  . Metoprolol Tartrate     REACTION: urinating too much  . Sulfonamide Derivatives Hives and Swelling  . Tekturna [Aliskiren Fumarate] Other (See Comments)    Gout   .  Terazosin Hcl     REACTION: ringing in the ears  . Verapamil     REACTION: Wt gain    Family History  Problem Relation Age of Onset  . Heart failure Mother   . Hypertension Mother   . Hypertension Father   . Heart failure Father   . Colon cancer Neg Hx      Social History   Socioeconomic History  . Marital status: Single    Spouse name: Not on file  . Number of children: Not on file  . Years of education: Not on file  . Highest education level: Not on file  Social Needs  . Financial resource strain: Not on file  . Food insecurity - worry: Not on file  . Food insecurity -  inability: Not on file  . Transportation needs - medical: Not on file  . Transportation needs - non-medical: Not on file  Occupational History  . 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 Smoking status: Former Smoker    Packs/day: 2.00    Years: 22.00    Pack years: 44.00    Types: Cigarettes    Last attempt to quit: 06/03/1988    Years since quitting: 28.9  . Smokeless tobacco: Never Used  Substance and Sexual Activity  . Alcohol use: Yes    Alcohol/week: 12.0 oz    Types: 20 Cans of beer per week    Comment: he drinks between 15-20 beers weekly  . Drug use: Yes    Types: Marijuana    Comment: MOST EVERY NIGHT FOR SLEEP  . Sexual activity: No  Other Topics Concern  . Not on file  Social History Narrative  . Not on file     PHYSICAL EXAM:  VS: BP (!) 206/110 (BP Location: Left Arm, Patient Position: Sitting, Cuff Size: Normal)   Pulse 73   Temp 98.3 F (36.8 C) (Oral)   Ht 5\' 8"  (1.727 m)   Wt 166 lb (75.3 kg)   SpO2 97%   BMI 25.24 kg/m  Physical Exam Gen: NAD, alert, cooperative with exam,  ENT: normal lips, normal nasal mucosa, tympanic membranes clear and intact bilaterally, normal oropharynx, no cervical lymphadenopathy Eye: normal EOM, normal conjunctiva and lids CV:  no edema, +2 pedal pulses, regular rate and rhythm, S1-S2   Resp: no accessory muscle use, non-labored, clear to auscultation bilaterally, no crackles or wheezes  Skin: no rashes, no areas of induration  Neuro: normal tone, normal sensation to touch, CN 2-12 intact  Psych:  normal insight, alert and oriented MSK: Normal gait, normal strength       ASSESSMENT & PLAN:   Essential hypertension Discussed on his elevated blood pressure today. He reports having intolerances to several medications and being seen by cardiology  - counseled on the risks of his blood pressure  - given indications to seek immediate care  - advised to have close follow up  with his PCP.   Acute upper respiratory infection Symptoms are likely viral in nature.  - chest xray  - counseled on supportive care

## 2017-05-21 NOTE — Assessment & Plan Note (Signed)
Discussed on his elevated blood pressure today. He reports having intolerances to several medications and being seen by cardiology  - counseled on the risks of his blood pressure  - given indications to seek immediate care  - advised to have close follow up with his PCP.

## 2017-05-21 NOTE — Telephone Encounter (Signed)
pt seeing schmitz today

## 2017-05-21 NOTE — Patient Instructions (Signed)
We will call you with the results from today  Please follow up in order to discuss your blood pressure. Please seek immediate care if you have numbness on one side of your body or you have trouble speaking.

## 2017-05-21 NOTE — Assessment & Plan Note (Signed)
Symptoms are likely viral in nature.  - chest xray  - counseled on supportive care

## 2017-05-24 ENCOUNTER — Telehealth: Payer: Self-pay | Admitting: Internal Medicine

## 2017-05-24 ENCOUNTER — Other Ambulatory Visit: Payer: Self-pay | Admitting: Internal Medicine

## 2017-05-24 DIAGNOSIS — M1000 Idiopathic gout, unspecified site: Secondary | ICD-10-CM

## 2017-05-24 MED ORDER — NITROGLYCERIN 0.4 MG SL SUBL: SUBLINGUAL_TABLET | SUBLINGUAL | 0 refills | Status: DC

## 2017-05-24 MED ORDER — INDOMETHACIN 25 MG PO CAPS: 25.0000 mg | ORAL_CAPSULE | Freq: Two times a day (BID) | ORAL | 0 refills | Status: DC | PRN

## 2017-05-24 NOTE — Telephone Encounter (Signed)
Copied from [place] [id number]. Topic: Quick Communication - Rx Refill/Question >> May 24, 2017  2:00 PM [place], [place] C wrote: Medication: nitroGLYCERIN (NITROSTAT) 0.4 MG SL tablet  and also indomethacin (INDOCIN) 25 MG capsule  Has the patient contacted their pharmacy? Yes    (Agent: If no, request that the patient contact the pharmacy for the refill.)  Preferred Pharmacy (with phone number or street name): [place], [place] - [address]  Agent: Please be advised that RX refills may take up to 3 business days. We ask that you follow-up with your pharmacy.

## 2017-05-24 NOTE — Telephone Encounter (Signed)
Pt  Requesting  Refills   On Nitrostat   0.4  Sl  Tab  Last   Filled   03-26-2017   30  Tabs   3  Refills                                                Indocin   25  Mg   Last   Filled  08-25-2015    By  Historical  Provider    Pharmacy  Of  Choice  Is   [facility]   [place]   [place]  [place]

## 2017-05-27 NOTE — Telephone Encounter (Signed)
Refill was sent on Friday.Marland KitchenJohny Chess

## 2017-06-13 ENCOUNTER — Telehealth: Payer: Self-pay | Admitting: *Deleted

## 2017-06-13 NOTE — Telephone Encounter (Signed)
Oncology Nurse Navigator Documentation  Spoke with Dr. Lucia Gaskins re patient status.  He indicated his office has been unable to contact him for follow-up s/p pt's 3/5 consult with Dr. Isidore Moos.  I called pt, LVMM asking for call-back.  Gayleen Orem, RN, BSN Head & Neck Oncology Nurse [place] at [place] [phone]

## 2017-06-18 ENCOUNTER — Telehealth: Payer: Self-pay | Admitting: *Deleted

## 2017-06-18 NOTE — Telephone Encounter (Signed)
Oncology Nurse Navigator Documentation  Rec'd return call from Mr. Pettijohn.    He indicated he was unaware he was to see Dr. Lucia Gaskins s/p is 3/5 consult with Dr. Isidore Moos to discuss surgical tmt option.  I noted I had talked with Dr. Lucia Gaskins last week who indicated his office has tried to reach him several times to schedule appt.  He acknowledged he has been have problems with his phone the past weeks.  I reviewed with him the tmt options per his discussion with Dr. Isidore Moos (resection followed by 6 wks adjuvant RT vs 7 wks RT).  I corrected his misunderstanding of actual tmt time and reminded him an open face mask is available.  He stated he will call Dr. Pollie Friar office tomorrow morning to schedule appt.  Gayleen Orem, RN, BSN Head & Neck Oncology Nurse [place] at [place] [phone]

## 2017-06-20 ENCOUNTER — Telehealth: Payer: Self-pay | Admitting: *Deleted

## 2017-06-21 NOTE — Telephone Encounter (Signed)
Oncology Nurse Navigator Documentation  Received VMM from Mr. Asbridge indicating he has 4/19 1600 appt with Dr. Lucia Gaskins.  Gayleen Orem, RN, BSN Head & Neck Oncology Nurse [place] at [place] [phone]

## 2017-06-24 ENCOUNTER — Telehealth: Payer: Self-pay | Admitting: Cardiovascular Disease

## 2017-06-24 ENCOUNTER — Ambulatory Visit: Payer: Medicare Other | Admitting: Internal Medicine

## 2017-06-24 ENCOUNTER — Telehealth: Payer: Self-pay | Admitting: Physician Assistant

## 2017-06-24 DIAGNOSIS — C770 Secondary and unspecified malignant neoplasm of lymph nodes of head, face and neck: Secondary | ICD-10-CM | POA: Diagnosis not present

## 2017-06-24 NOTE — Telephone Encounter (Signed)
error 

## 2017-06-24 NOTE — Telephone Encounter (Signed)
Received called personally for Dr. Lucia Gaskins for stat cardiac clearance for L neck dissection for cancer. Last seen by Dr. Burt Knack in 2017. He recommended cath for anginal symptoms however never followed up.   No availability for any PA for 2 weeks. Will see Dr. Curt Bears (DOD) tomorrow on 24 hours held slot.  Likely he will need diagnostic cath or angioplasty only  prior to cardiac clearance.

## 2017-06-25 ENCOUNTER — Ambulatory Visit (INDEPENDENT_AMBULATORY_CARE_PROVIDER_SITE_OTHER): Payer: Medicare Other | Admitting: Cardiology

## 2017-06-25 ENCOUNTER — Encounter: Payer: Self-pay | Admitting: Cardiology

## 2017-06-25 VITALS — BP 178/80 | HR 73 | Ht 68.0 in | Wt 161.8 lb

## 2017-06-25 DIAGNOSIS — I10 Essential (primary) hypertension: Secondary | ICD-10-CM

## 2017-06-25 DIAGNOSIS — E785 Hyperlipidemia, unspecified: Secondary | ICD-10-CM | POA: Diagnosis not present

## 2017-06-25 DIAGNOSIS — Z01810 Encounter for preprocedural cardiovascular examination: Secondary | ICD-10-CM

## 2017-06-25 DIAGNOSIS — I208 Other forms of angina pectoris: Secondary | ICD-10-CM | POA: Diagnosis not present

## 2017-06-25 DIAGNOSIS — I2089 Other forms of angina pectoris: Secondary | ICD-10-CM

## 2017-06-25 NOTE — Progress Notes (Signed)
Electrophysiology Office Note   Date:  06/25/2017   ID:  Mario Berg, DOB [birth year], MRN [id number]  PCP:  Cassandria Anger, MD  Cardiologist:  Burt Knack    Chief Complaint  Patient presents with  . Follow-up    Cardiac clearance     History of Present Illness: Mario Berg is a 77 y.o. male who is being seen today for the evaluation of preop clearance at the request of [name], [name], MD. Presenting today for electrophysiology evaluation.  Patient has a history of unstable angina.  He was recommended cardiac catheterization in 2017 but declined.  He was started on isosorbide and aspirin.  He refused cholesterol medications.  His angina improved after taking the isosorbide.  He has plan for left neck dissection due to cancer.  Today, he denies symptoms of palpitations, shortness of breath, orthopnea, PND, lower extremity edema, claudication, dizziness, presyncope, syncope, bleeding, or neurologic sequela. The patient is tolerating medications without difficulties.  Continue to have chest pain.  The pain is in the center of his chest.  Mainly occurs when he exerts himself.  He does take isosorbide and has some short acting nitroglycerin that greatly improves his discomfort.  His pain multiple times a week.   Past Medical History:  Diagnosis Date  . Adenomatous polyp 04/2007  . BPH (benign prostatic hyperplasia)   . DIVERTICULITIS OF COLON 05/06/2007  . Diverticulitis, colon   . Diverticulosis of colon   . Gout   . Gout   . Hemorrhoids   . Hyperlipidemia   . Hypertension   . IBS (irritable bowel syndrome)    Dr. Fuller Plan  C/D  . LBP (low back pain)   . Merkel cell skin cancer of cheek ([name])   . Nasal congestion   . OA (osteoarthritis)   . Rectal bleeding   . Shortness of breath    [place]  . Squamous cell cancer of skin of left cheek   . Umbilical hernia    NO PAIN-BUT PT WORRIES ABOUT IT--WEARS A BACK BRACE FOR SUPPORT AT TIMES   Past Surgical History:    Procedure Laterality Date  . HEMORRHOID SURGERY  05/17/2011   Procedure: HEMORRHOIDECTOMY PROLAPSED;  Surgeon: Adin Hector, MD;  Location: [facility];  Service: General;  Laterality: N/A;  [place] Hemorrhoidal Ligation Pexy   . HEMORRHOID SURGERY  2013   Dr [name]  . SKIN CANCER EXCISION  12-21-2006   Nose  . [facility]     05/17/2011     Current Outpatient Medications  Medication Sig Dispense Refill  . aspirin EC 81 MG tablet Take 1 tablet (81 mg total) by mouth daily. 100 tablet 3  . Coenzyme Q10 (CO Q 10) 100 MG CAPS Take 100 mg by mouth daily.    . indomethacin (INDOCIN) 25 MG capsule TAKE ONE CAPSULE BY MOUTH TWICE DAILY AS NEEDED FOR GOUT. PATIENT [name] TAKE FIRST BEFORE THE 50 MG CAPSULE 60 capsule 3  . indomethacin (INDOCIN) 25 MG capsule Take 1 capsule (25 mg total) by mouth 2 (two) times daily as needed (gout flares). 30 capsule 0  . isosorbide mononitrate (IMDUR) 30 MG 24 hr tablet Take 1 tablet (30 mg total) by mouth daily. 90 tablet 3  . lisinopril (PRINIVIL,ZESTRIL) 20 MG tablet TAKE ONE TABLET BY MOUTH TWICE DAILY 180 tablet 3  . Multiple Vitamin (MULTIVITAMIN) tablet Take 1 tablet by mouth daily.     . nitroGLYCERIN (NITROSTAT) 0.4 MG SL tablet PLACE 1 TABLET  UNDER THE TONGUE EVERY 5 MINUTES AS NEEDED FOR CHEST PAIN 20 tablet 0  . Omega-3 Fatty Acids (FISH OIL) 1200 MG CAPS Take 1,200 mg by mouth daily.     Marland Kitchen Ophthalmic Irrigation Solution (EYE WASH) 99.05 % SOLN Apply 1 application to eye daily.    Marland Kitchen oxymetazoline (AFRIN) 0.05 % nasal spray Place 1 spray into both nostrils 2 (two) times daily as needed for congestion.     No current facility-administered medications for this visit.     Allergies:   Penicillins; Atorvastatin; Furosemide; Bystolic [nebivolol hcl]; Clams [shellfish allergy]; Clonidine derivatives; Gemfibrozil; Metoprolol tartrate; Sulfonamide derivatives; Tekturna [aliskiren fumarate]; Terazosin hcl; and Verapamil   Social History:  The patient  reports that he quit  smoking about 29 years ago. His smoking use included cigarettes. He has a 44.00 pack-year smoking history. He has never used smokeless tobacco. He reports that he drinks about 12.0 oz of alcohol per week. He reports that he has current or past drug history. Drug: Marijuana.   Family History:  The patient's family history includes Heart failure in his father and mother; Hypertension in his father and mother.    ROS:  Please see the history of present illness.   Otherwise, review of systems is positive for sweats, fatigue, chest pain, cough, wheezing, constipation, diarrhea, anxiety, balance problems, back pain.   All other systems are reviewed and negative.    PHYSICAL EXAM: VS:  BP (!) 178/80   Pulse 73   Ht 5\' 8"  (1.727 m)   Wt 161 lb 12.8 oz (73.4 kg)   SpO2 96%   BMI 24.60 kg/m  , BMI Body mass index is 24.6 kg/m. GEN: Well nourished, well developed, in no acute distress  HEENT: normal  Neck: no JVD, carotid bruits, or masses Cardiac: RRR; no murmurs, rubs, or gallops,no edema  Respiratory:  clear to auscultation bilaterally, normal work of breathing GI: soft, nontender, nondistended, + BS MS: no deformity or atrophy  Skin: warm and dry Neuro:  Strength and sensation are intact Psych: euthymic mood, full affect  EKG:  EKG is ordered today. Personal review of the ekg ordered shows SR, PVC, rate 73  Recent Labs: 03/26/2017: ALT 10; BUN 14; Creatinine, Ser 0.85; Hemoglobin 16.8; Platelets 206.0; Potassium 4.2; Sodium 141; TSH 1.83    Lipid Panel     Component Value Date/Time   CHOL 222 (H) 07/08/2015 0102   TRIG 157 (H) 07/08/2015 0102   HDL 35 (L) 07/08/2015 0102   CHOLHDL 6.3 07/08/2015 0102   VLDL 31 07/08/2015 0102   LDLCALC 156 (H) 07/08/2015 0102   LDLDIRECT 151.9 01/14/2012 1301     Wt Readings from Last 3 Encounters:  06/25/17 161 lb 12.8 oz (73.4 kg)  05/21/17 166 lb (75.3 kg)  05/07/17 169 lb 3.2 oz (76.7 kg)      Other studies Reviewed: Additional  studies/ records that were reviewed today include: TTE 2017  Review of the above records today demonstrates:  - Left ventricle: The cavity size was normal. There was moderate   focal basal hypertrophy. Systolic function was normal. The   estimated ejection fraction was in the range of 60% to 65%. Wall   motion was normal; there were no regional wall motion   abnormalities. There was an increased relative contribution of   atrial contraction to ventricular filling. Doppler parameters are   consistent with abnormal left ventricular relaxation (grade 1   diastolic dysfunction). - Aortic valve: Trileaflet; normal thickness, mildly calcified  leaflets.   ASSESSMENT AND PLAN:  1.  Class II-III stable angina: Symptoms better on isosorbide.  He does have a strong aversion to medications and thus medical therapy has been limited.  Not willing to take aspirin.  He was offered catheterization in the past but declined.  He missed his prior follow-up.  I did discuss with him the possibility of heart catheterization due to his upcoming surgery and better assessing his risk.  He has agreed to left heart catheterization.  2.  Hypertension, uncontrolled: Has multiple medication intolerances.  These include beta-blockers, calcium channel blockers, and other classes of antihypertensives.  This time, he refuses further antihypertensive medications.  He is currently on lisinopril and is taking an herbal medication.  3.  Hyperlipidemia: Unwilling to take his lipid-lowering medications.  4.  Preoperative evaluation: Is planned for left neck dissection on 07/05/2017.  At this point, it is difficult to assess his risk due to his ongoing coronary disease.  We Aedyn Kempfer plan for left heart catheterization to further determine risk.    Current medicines are reviewed at length with the patient today.   The patient does not have concerns regarding his medicines.  The following changes were made today:  none  Labs/ tests  ordered today include:  Orders Placed This Encounter  Procedures  . EKG 12-Lead   Case discussed with referring  Disposition:   FU with Gerrit Halls 3 months  Signed, Vallorie Niccoli Meredith Leeds, MD  06/25/2017 4:28 PM     [place] [address] [phone] (office) [phone] (fax)

## 2017-06-25 NOTE — Patient Instructions (Addendum)
Medication Instructions:  Your physician recommends that you continue on your current medications as directed. Please refer to the Current Medication list given to you today.  Labwork: None ordered  Testing/Procedures: Your physician has requested that you have a cardiac catheterization. Cardiac catheterization is used to diagnose and/or treat various heart conditions. Doctors may recommend this procedure for a number of different reasons. The most common reason is to evaluate chest pain. Chest pain can be a symptom of coronary artery disease (CAD), and cardiac catheterization can show whether plaque is narrowing or blocking your heart's arteries. This procedure is also used to evaluate the valves, as well as measure the blood flow and oxygen levels in different parts of your heart. For further information please visit [email or web address].   Nurse will call you to arrange this procedure.  Please follow instruction sheet below under special instructions   Follow-Up: Your physician recommends that you schedule a follow-up appointment in: 3 months with Dr. Burt Knack.   * If you need a refill on your cardiac medications before your next appointment, please call your pharmacy.   *Please note that any paperwork needing to be filled out by the provider will need to be addressed at the front desk prior to seeing the provider. Please note that any FMLA, disability or other documents regarding health condition is subject to a $25.00 charge that must be received prior to completion of paperwork in the form of a money order or check.  Thank you for choosing [facility]!!   Trinidad Curet, RN [phone]  Any Other Special Instructions Will Be Listed Below (If Applicable).   Coronary Angiogram A coronary angiogram is an X-ray procedure that is used to examine the arteries in the heart. In this procedure, a dye (contrast dye) is injected through a long, thin tube (catheter). The catheter is inserted  through the groin, wrist, or arm. The dye is injected into each artery, then X-rays are taken to show if there is a blockage in the arteries of the heart. This procedure can also show if you have valve disease or a disease of the aorta, and it can be used to check the overall function of your heart muscle. You may have a coronary angiogram if:  You are having chest pain, or other symptoms of angina, and you are at risk for heart disease.  You have an abnormal electrocardiogram (ECG) or stress test.  You have chest pain and heart failure.  You are having irregular heart rhythms.  You and your health care provider determine that the benefits of the test information outweigh the risks of the procedure.  Let your health care provider know about:  Any allergies you have, including allergies to contrast dye.  All medicines you are taking, including vitamins, herbs, eye drops, creams, and over-the-counter medicines.  Any problems you or family members have had with anesthetic medicines.  Any blood disorders you have.  Any surgeries you have had.  History of kidney problems or kidney failure.  Any medical conditions you have.  Whether you are pregnant or may be pregnant. What are the risks? Generally, this is a safe procedure. However, problems may occur, including:  Infection.  Allergic reaction to medicines or dyes that are used.  Bleeding from the access site or other locations.  Kidney injury, especially in people with impaired kidney function.  Stroke (rare).  Heart attack (rare).  Damage to other structures or organs.  What happens before the procedure? Staying hydrated Follow instructions from  your health care provider about hydration, which may include:  Up to 2 hours before the procedure - you may continue to drink clear liquids, such as water, clear fruit juice, black coffee, and plain tea.  Eating and drinking restrictions Follow instructions from your health  care provider about eating and drinking, which may include:  8 hours before the procedure - stop eating heavy meals or foods such as meat, fried foods, or fatty foods.  6 hours before the procedure - stop eating light meals or foods, such as toast or cereal.  2 hours before the procedure - stop drinking clear liquids.  General instructions  Ask your health care provider about: ? Changing or stopping your regular medicines. This is especially important if you are taking diabetes medicines or blood thinners. ? Taking medicines such as ibuprofen. These medicines can thin your blood. Do not take these medicines before your procedure if your health care provider instructs you not to, though aspirin may be recommended prior to coronary angiograms.  Plan to have someone take you home from the hospital or clinic.  You may need to have blood tests or X-rays done. What happens during the procedure?  An IV tube will be inserted into one of your veins.  You will be given one or more of the following: ? A medicine to help you relax (sedative). ? A medicine to numb the area where the catheter will be inserted into an artery (local anesthetic).  To reduce your risk of infection: ? Your health care team will wash or sanitize their hands. ? Your skin will be washed with soap. ? Hair may be removed from the area where the catheter will be inserted.  You will be connected to a continuous ECG monitor.  The catheter will be inserted into an artery. The location may be in your groin, in your wrist, or in the fold of your arm (near your elbow).  A type of X-ray (fluoroscopy) will be used to help guide the catheter to the opening of the blood vessel that is being examined.  A dye will be injected into the catheter, and X-rays will be taken. The dye will help to show where any narrowing or blockages are located in the heart arteries.  Tell your health care provider if you have any chest pain or trouble  breathing during the procedure.  If blockages are found, your health care provider may perform another procedure, such as inserting a coronary stent. The procedure may vary among health care providers and hospitals. What happens after the procedure?  After the procedure, you will need to keep the area still for a few hours, or for as long as told by your health care provider. If the procedure is done through the groin, you will be instructed to not bend and not cross your legs.  The insertion site will be checked frequently.  The pulse in your foot or wrist will be checked frequently.  You may have additional blood tests, X-rays, and a test that records the electrical activity of your heart (ECG).  Do not drive for 24 hours if you were given a sedative. Summary  A coronary angiogram is an X-ray procedure that is used to look into the arteries in the heart.  During the procedure, a dye (contrast dye) is injected through a long, thin tube (catheter). The catheter is inserted through the groin, wrist, or arm.  Tell your health care provider about any allergies you have, including allergies to  contrast dye.  After the procedure, you will need to keep the area still for a few hours, or for as long as told by your health care provider. This information is not intended to replace advice given to you by your health care provider. Make sure you discuss any questions you have with your health care provider. Document Released: 08/26/2002 Document Revised: 12/02/2015 Document Reviewed: 12/02/2015 Elsevier Interactive Patient Education  2018 [facility] OFF[facility] [address] [place] [address] Dept: [phone] Loc: [phone]  DATHAN ATTIA  06/25/2017  You are scheduled for a Cardiac Catheterization on ____________________  1. Please arrive at the [facility] (Main  Entrance A) at [facility]: [address] [place], [place] [address] at ______________ (two hours before your procedure to ensure your preparation). Free valet parking service is available.   Special note: Every effort is made to have your procedure done on time. Please understand that emergencies sometimes delay scheduled procedures.  2. Diet: _____________  3. Labs: ______________  4. Medication instructions in preparation for your procedure:  On the morning of your procedure, take your Aspirin and any morning medicines NOT listed above.  You may use sips of water.  5. Plan for one night stay--bring personal belongings. 6. Bring a current list of your medications and current insurance cards. 7. You MUST have a responsible person to drive you home. 8. Someone MUST be with you the first 24 hours after you arrive home or your discharge will be delayed. 9. Please wear clothes that are easy to get on and off and wear slip-on shoes.  Thank you for allowing [place] to care for you!   -- [facility]

## 2017-06-25 NOTE — H&P (View-Only) (Signed)
Electrophysiology Office Note   Date:  06/25/2017   ID:  Mario Berg, DOB [birth year], MRN [id number]  PCP:  Cassandria Anger, MD  Cardiologist:  Burt Knack    Chief Complaint  Patient presents with  . Follow-up    Cardiac clearance     History of Present Illness: Mario Berg is a 77 y.o. male who is being seen today for the evaluation of preop clearance at the request of [name], [name], MD. Presenting today for electrophysiology evaluation.  Patient has a history of unstable angina.  He was recommended cardiac catheterization in 2017 but declined.  He was started on isosorbide and aspirin.  He refused cholesterol medications.  His angina improved after taking the isosorbide.  He has plan for left neck dissection due to cancer.  Today, he denies symptoms of palpitations, shortness of breath, orthopnea, PND, lower extremity edema, claudication, dizziness, presyncope, syncope, bleeding, or neurologic sequela. The patient is tolerating medications without difficulties.  Continue to have chest pain.  The pain is in the center of his chest.  Mainly occurs when he exerts himself.  He does take isosorbide and has some short acting nitroglycerin that greatly improves his discomfort.  His pain multiple times a week.   Past Medical History:  Diagnosis Date  . Adenomatous polyp 04/2007  . BPH (benign prostatic hyperplasia)   . DIVERTICULITIS OF COLON 05/06/2007  . Diverticulitis, colon   . Diverticulosis of colon   . Gout   . Gout   . Hemorrhoids   . Hyperlipidemia   . Hypertension   . IBS (irritable bowel syndrome)    Dr. Fuller Plan  C/D  . LBP (low back pain)   . 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 Nasal congestion   . OA (osteoarthritis)   . Rectal bleeding   . Shortness of breath    [place]  . Squamous cell cancer of skin of left cheek   . Umbilical hernia    NO PAIN-BUT PT WORRIES ABOUT IT--WEARS A BACK BRACE FOR SUPPORT AT TIMES   Past Surgical History:    Procedure Laterality Date  . HEMORRHOID SURGERY  05/17/2011   Procedure: HEMORRHOIDECTOMY PROLAPSED;  Surgeon: Adin Hector, MD;  Location: [facility];  Service: General;  Laterality: N/A;  [place] Hemorrhoidal Ligation [place]   . HEMORRHOID SURGERY  2013   Dr [name]  . SKIN CANCER EXCISION  12-21-2006   Nose  . [facility]     05/17/2011     Current Outpatient Medications  Medication Sig Dispense Refill  . aspirin EC 81 MG tablet Take 1 tablet (81 mg total) by mouth daily. 100 tablet 3  . Coenzyme Q10 (CO Q 10) 100 MG CAPS Take 100 mg by mouth daily.    . indomethacin (INDOCIN) 25 MG capsule TAKE ONE CAPSULE BY MOUTH TWICE DAILY AS NEEDED FOR GOUT. PATIENT Mariette Cowley TAKE FIRST BEFORE THE 50 MG CAPSULE 60 capsule 3  . indomethacin (INDOCIN) 25 MG capsule Take 1 capsule (25 mg total) by mouth 2 (two) times daily as needed (gout flares). 30 capsule 0  . isosorbide mononitrate (IMDUR) 30 MG 24 hr tablet Take 1 tablet (30 mg total) by mouth daily. 90 tablet 3  . lisinopril (PRINIVIL,ZESTRIL) 20 MG tablet TAKE ONE TABLET BY MOUTH TWICE DAILY 180 tablet 3  . Multiple Vitamin (MULTIVITAMIN) tablet Take 1 tablet by mouth daily.     . nitroGLYCERIN (NITROSTAT) 0.4 MG SL tablet PLACE 1 TABLET  UNDER THE TONGUE EVERY 5 MINUTES AS NEEDED FOR CHEST PAIN 20 tablet 0  . Omega-3 Fatty Acids (FISH OIL) 1200 MG CAPS Take 1,200 mg by mouth daily.     Marland Kitchen Ophthalmic Irrigation Solution (EYE WASH) 99.05 % SOLN Apply 1 application to eye daily.    Marland Kitchen oxymetazoline (AFRIN) 0.05 % nasal spray Place 1 spray into both nostrils 2 (two) times daily as needed for congestion.     No current facility-administered medications for this visit.     Allergies:   Penicillins; Atorvastatin; Furosemide; Bystolic [nebivolol hcl]; Clams [shellfish allergy]; Clonidine derivatives; Gemfibrozil; Metoprolol tartrate; Sulfonamide derivatives; Tekturna [aliskiren fumarate]; Terazosin hcl; and Verapamil   Social History:  The patient  reports that he quit  smoking about 29 years ago. His smoking use included cigarettes. He has a 44.00 pack-year smoking history. He has never used smokeless tobacco. He reports that he drinks about 12.0 oz of alcohol per week. He reports that he has current or past drug history. Drug: Marijuana.   Family History:  The patient's family history includes Heart failure in his father and mother; Hypertension in his father and mother.    ROS:  Please see the history of present illness.   Otherwise, review of systems is positive for sweats, fatigue, chest pain, cough, wheezing, constipation, diarrhea, anxiety, balance problems, back pain.   All other systems are reviewed and negative.    PHYSICAL EXAM: VS:  BP (!) 178/80   Pulse 73   Ht 5\' 8"  (1.727 m)   Wt 161 lb 12.8 oz (73.4 kg)   SpO2 96%   BMI 24.60 kg/m  , BMI Body mass index is 24.6 kg/m. GEN: Well nourished, well developed, in no acute distress  HEENT: normal  Neck: no JVD, carotid bruits, or masses Cardiac: RRR; no murmurs, rubs, or gallops,no edema  Respiratory:  clear to auscultation bilaterally, normal work of breathing GI: soft, nontender, nondistended, + BS MS: no deformity or atrophy  Skin: warm and dry Neuro:  Strength and sensation are intact Psych: euthymic mood, full affect  EKG:  EKG is ordered today. Personal review of the ekg ordered shows SR, PVC, rate 73  Recent Labs: 03/26/2017: ALT 10; BUN 14; Creatinine, Ser 0.85; Hemoglobin 16.8; Platelets 206.0; Potassium 4.2; Sodium 141; TSH 1.83    Lipid Panel     Component Value Date/Time   CHOL 222 (H) 07/08/2015 0102   TRIG 157 (H) 07/08/2015 0102   HDL 35 (L) 07/08/2015 0102   CHOLHDL 6.3 07/08/2015 0102   VLDL 31 07/08/2015 0102   LDLCALC 156 (H) 07/08/2015 0102   LDLDIRECT 151.9 01/14/2012 1301     Wt Readings from Last 3 Encounters:  06/25/17 161 lb 12.8 oz (73.4 kg)  05/21/17 166 lb (75.3 kg)  05/07/17 169 lb 3.2 oz (76.7 kg)      Other studies Reviewed: Additional  studies/ records that were reviewed today include: TTE 2017  Review of the above records today demonstrates:  - Left ventricle: The cavity size was normal. There was moderate   focal basal hypertrophy. Systolic function was normal. The   estimated ejection fraction was in the range of 60% to 65%. Wall   motion was normal; there were no regional wall motion   abnormalities. There was an increased relative contribution of   atrial contraction to ventricular filling. Doppler parameters are   consistent with abnormal left ventricular relaxation (grade 1   diastolic dysfunction). - Aortic valve: Trileaflet; normal thickness, mildly calcified  leaflets.   ASSESSMENT AND PLAN:  1.  Class II-III stable angina: Symptoms better on isosorbide.  He does have a strong aversion to medications and thus medical therapy has been limited.  Not willing to take aspirin.  He was offered catheterization in the past but declined.  He missed his prior follow-up.  I did discuss with him the possibility of heart catheterization due to his upcoming surgery and better assessing his risk.  He has agreed to left heart catheterization.  2.  Hypertension, uncontrolled: Has multiple medication intolerances.  These include beta-blockers, calcium channel blockers, and other classes of antihypertensives.  This time, he refuses further antihypertensive medications.  He is currently on lisinopril and is taking an herbal medication.  3.  Hyperlipidemia: Unwilling to take his lipid-lowering medications.  4.  Preoperative evaluation: Is planned for left neck dissection on 07/05/2017.  At this point, it is difficult to assess his risk due to his ongoing coronary disease.  We Chanay Nugent plan for left heart catheterization to further determine risk.    Current medicines are reviewed at length with the patient today.   The patient does not have concerns regarding his medicines.  The following changes were made today:  none  Labs/ tests  ordered today include:  Orders Placed This Encounter  Procedures  . EKG 12-Lead   Case discussed with referring  Disposition:   FU with Gerrit Halls 3 months  Signed, Montie Gelardi Meredith Leeds, MD  06/25/2017 4:28 PM     [address] [place] [address] [phone] (office) [phone] (fax)

## 2017-06-26 ENCOUNTER — Telehealth: Payer: Self-pay | Admitting: Cardiology

## 2017-06-26 ENCOUNTER — Other Ambulatory Visit: Payer: Self-pay | Admitting: Cardiology

## 2017-06-26 DIAGNOSIS — I209 Angina pectoris, unspecified: Secondary | ICD-10-CM

## 2017-06-26 NOTE — Telephone Encounter (Signed)
Cath instructions reviewed w/ pt.  Letter reviewed yesterday. Advised NPO after 7am tomorrow morning. NPO after that. Labs will be performed at the hospital prior to cath. He understands he needs a driver home and someone to stay w/ him post cath. Pt already has scheduled follow up with Dr. Burt Knack. Patient verbalized understanding and agreeable to plan.

## 2017-06-26 NOTE — Telephone Encounter (Signed)
Pt would like to go this week if possible.  He can only go in the afternoon. Pt aware I will check availability and call him back.

## 2017-06-26 NOTE — Telephone Encounter (Signed)
Follow up ° °Pt returning call for nurse °

## 2017-06-26 NOTE — Telephone Encounter (Signed)
New Message   Pt states he was suppose to have a cath scheduled for today but hasn't heard anything yet. Please call

## 2017-06-27 ENCOUNTER — Other Ambulatory Visit: Payer: Self-pay

## 2017-06-27 ENCOUNTER — Encounter (HOSPITAL_COMMUNITY)
Admission: RE | Discharge status: Against Medical Advice | Payer: Self-pay | Source: Ambulatory Visit | Attending: Cardiovascular Disease

## 2017-06-27 ENCOUNTER — Inpatient Hospital Stay (HOSPITAL_COMMUNITY)
Admission: RE | Admit: 2017-06-27 | Discharge: 2017-06-28 | DRG: 287 | Discharge status: Against Medical Advice | Payer: Medicare Other | Source: Ambulatory Visit | Attending: Cardiovascular Disease | Admitting: Cardiovascular Disease

## 2017-06-27 ENCOUNTER — Encounter (HOSPITAL_COMMUNITY): Payer: Self-pay | Admitting: General Practice

## 2017-06-27 DIAGNOSIS — Z79899 Other long term (current) drug therapy: Secondary | ICD-10-CM | POA: Diagnosis not present

## 2017-06-27 DIAGNOSIS — Z882 Allergy status to sulfonamides status: Secondary | ICD-10-CM

## 2017-06-27 DIAGNOSIS — E785 Hyperlipidemia, unspecified: Secondary | ICD-10-CM | POA: Diagnosis present

## 2017-06-27 DIAGNOSIS — Z9114 Patient's other noncompliance with medication regimen: Secondary | ICD-10-CM | POA: Diagnosis not present

## 2017-06-27 DIAGNOSIS — C770 Secondary and unspecified malignant neoplasm of lymph nodes of head, face and neck: Secondary | ICD-10-CM | POA: Diagnosis present

## 2017-06-27 DIAGNOSIS — I209 Angina pectoris, unspecified: Secondary | ICD-10-CM

## 2017-06-27 DIAGNOSIS — I25110 Atherosclerotic heart disease of native coronary artery with unstable angina pectoris: Principal | ICD-10-CM | POA: Diagnosis present

## 2017-06-27 DIAGNOSIS — Z791 Long term (current) use of non-steroidal anti-inflammatories (NSAID): Secondary | ICD-10-CM

## 2017-06-27 DIAGNOSIS — I2584 Coronary atherosclerosis due to calcified coronary lesion: Secondary | ICD-10-CM | POA: Diagnosis present

## 2017-06-27 DIAGNOSIS — I2582 Chronic total occlusion of coronary artery: Secondary | ICD-10-CM | POA: Diagnosis present

## 2017-06-27 DIAGNOSIS — Z91013 Allergy to seafood: Secondary | ICD-10-CM | POA: Diagnosis not present

## 2017-06-27 DIAGNOSIS — I200 Unstable angina: Secondary | ICD-10-CM | POA: Diagnosis not present

## 2017-06-27 DIAGNOSIS — Z8249 Family history of ischemic heart disease and other diseases of the circulatory system: Secondary | ICD-10-CM | POA: Diagnosis not present

## 2017-06-27 DIAGNOSIS — Z85828 Personal history of other malignant neoplasm of skin: Secondary | ICD-10-CM

## 2017-06-27 DIAGNOSIS — Z888 Allergy status to other drugs, medicaments and biological substances status: Secondary | ICD-10-CM

## 2017-06-27 DIAGNOSIS — R079 Chest pain, unspecified: Secondary | ICD-10-CM | POA: Diagnosis not present

## 2017-06-27 DIAGNOSIS — Z880 Allergy status to penicillin: Secondary | ICD-10-CM

## 2017-06-27 DIAGNOSIS — I10 Essential (primary) hypertension: Secondary | ICD-10-CM | POA: Diagnosis present

## 2017-06-27 DIAGNOSIS — Z87891 Personal history of nicotine dependence: Secondary | ICD-10-CM | POA: Diagnosis not present

## 2017-06-27 DIAGNOSIS — Z7982 Long term (current) use of aspirin: Secondary | ICD-10-CM | POA: Diagnosis not present

## 2017-06-27 DIAGNOSIS — E7800 Pure hypercholesterolemia, unspecified: Secondary | ICD-10-CM | POA: Diagnosis not present

## 2017-06-27 DIAGNOSIS — Z9119 Patient's noncompliance with other medical treatment and regimen: Secondary | ICD-10-CM | POA: Diagnosis not present

## 2017-06-27 DIAGNOSIS — Z91199 Patient's noncompliance with other medical treatment and regimen due to unspecified reason: Secondary | ICD-10-CM

## 2017-06-27 DIAGNOSIS — I2510 Atherosclerotic heart disease of native coronary artery without angina pectoris: Secondary | ICD-10-CM | POA: Diagnosis present

## 2017-06-27 HISTORY — DX: Gout, unspecified: M10.9

## 2017-06-27 HISTORY — PX: CARDIAC CATHETERIZATION: SHX172

## 2017-06-27 HISTORY — PX: LEFT HEART CATH AND CORONARY ANGIOGRAPHY: CATH118249

## 2017-06-27 HISTORY — DX: Basal cell carcinoma of skin of nose: C44.311

## 2017-06-27 HISTORY — DX: Tinnitus, bilateral: H93.13

## 2017-06-27 LAB — BASIC METABOLIC PANEL
ANION GAP: 10 (ref 5–15)
BUN: 8 mg/dL (ref 6–20)
CALCIUM: 9.2 mg/dL (ref 8.9–10.3)
CO2: 22 mmol/L (ref 22–32)
CREATININE: 0.88 mg/dL (ref 0.61–1.24)
Chloride: 109 mmol/L (ref 101–111)
GFR calc non Af Amer: >60 mL/min (ref >60)
Glucose, Bld: 106 mg/dL — ABNORMAL HIGH (ref 65–99)
Potassium: 3.9 mmol/L (ref 3.5–5.1)
SODIUM: 141 mmol/L (ref 135–145)

## 2017-06-27 LAB — CBC
HCT: 41.7 % (ref 39.0–52.0)
Hemoglobin: 14 g/dL (ref 13.0–17.0)
MCH: 31.3 pg (ref 26.0–34.0)
MCHC: 33.6 g/dL (ref 30.0–36.0)
MCV: 93.1 fL (ref 78.0–100.0)
Platelets: 200 10*3/uL (ref 150–400)
RBC: 4.48 MIL/uL (ref 4.22–5.81)
RDW: 13.8 % (ref 11.5–15.5)
WBC: 8.5 10*3/uL (ref 4.0–10.5)

## 2017-06-27 SURGERY — LEFT HEART CATH AND CORONARY ANGIOGRAPHY
Anesthesia: LOCAL

## 2017-06-27 MED ORDER — MIDAZOLAM HCL 2 MG/2ML IJ SOLN
INTRAMUSCULAR | Status: AC
  Filled 2017-06-27: qty 2

## 2017-06-27 MED ORDER — FENTANYL CITRATE (PF) 100 MCG/2ML IJ SOLN
INTRAMUSCULAR | Status: AC
  Filled 2017-06-27: qty 2

## 2017-06-27 MED ORDER — NITROGLYCERIN IN D5W 200-5 MCG/ML-% IV SOLN
INTRAVENOUS | Status: AC
  Filled 2017-06-27: qty 250

## 2017-06-27 MED ORDER — CO Q 10 100 MG PO CAPS: 100.0000 mg | ORAL_CAPSULE | Freq: Every day | ORAL | Status: DC

## 2017-06-27 MED ORDER — ACETAMINOPHEN 325 MG PO TABS: 650.0000 mg | ORAL_TABLET | ORAL | Status: DC | PRN

## 2017-06-27 MED ORDER — SODIUM CHLORIDE 0.9% FLUSH: 3.0000 mL | Freq: Two times a day (BID) | INTRAVENOUS | Status: DC

## 2017-06-27 MED ORDER — METOPROLOL TARTRATE 5 MG/5ML IV SOLN
INTRAVENOUS | Status: DC | PRN
  Administered 2017-06-27: 5 mg via INTRAVENOUS

## 2017-06-27 MED ORDER — ASPIRIN 81 MG PO CHEW
81.0000 mg | CHEWABLE_TABLET | ORAL | Status: AC
  Administered 2017-06-28: 81 mg via ORAL
  Filled 2017-06-27: qty 1

## 2017-06-27 MED ORDER — HEPARIN SODIUM (PORCINE) 1000 UNIT/ML IJ SOLN
INTRAMUSCULAR | Status: DC | PRN
  Administered 2017-06-27: 4000 [IU] via INTRAVENOUS

## 2017-06-27 MED ORDER — SALINE SPRAY 0.65 % NA SOLN
1.0000 | NASAL | Status: DC | PRN
  Administered 2017-06-27: 1 via NASAL
  Filled 2017-06-27: qty 44

## 2017-06-27 MED ORDER — HEPARIN (PORCINE) IN NACL 1000-0.9 UT/500ML-% IV SOLN
INTRAVENOUS | Status: AC
  Filled 2017-06-27: qty 500

## 2017-06-27 MED ORDER — FENTANYL CITRATE (PF) 100 MCG/2ML IJ SOLN
INTRAMUSCULAR | Status: DC | PRN
  Administered 2017-06-27 (×2): 25 ug via INTRAVENOUS

## 2017-06-27 MED ORDER — SODIUM CHLORIDE 0.9% FLUSH: 3.0000 mL | INTRAVENOUS | Status: DC | PRN

## 2017-06-27 MED ORDER — ASPIRIN 81 MG PO CHEW: 81.0000 mg | CHEWABLE_TABLET | ORAL | Status: DC

## 2017-06-27 MED ORDER — SODIUM CHLORIDE 0.9 % IV SOLN: 250.0000 mL | INTRAVENOUS | Status: DC | PRN

## 2017-06-27 MED ORDER — SODIUM CHLORIDE 0.9 % IV SOLN
INTRAVENOUS | Status: AC
  Administered 2017-06-27: 17:00:00 via INTRAVENOUS

## 2017-06-27 MED ORDER — LISINOPRIL 40 MG PO TABS
40.0000 mg | ORAL_TABLET | Freq: Two times a day (BID) | ORAL | Status: DC
  Administered 2017-06-27: 40 mg via ORAL
  Filled 2017-06-27: qty 1

## 2017-06-27 MED ORDER — LIDOCAINE HCL (PF) 1 % IJ SOLN
INTRAMUSCULAR | Status: DC | PRN
  Administered 2017-06-27: 2 mL

## 2017-06-27 MED ORDER — HYDRALAZINE HCL 20 MG/ML IJ SOLN
INTRAMUSCULAR | Status: DC | PRN
  Administered 2017-06-27: 10 mg via INTRAVENOUS

## 2017-06-27 MED ORDER — CLOPIDOGREL BISULFATE 75 MG PO TABS
600.0000 mg | ORAL_TABLET | Freq: Once | ORAL | Status: AC
  Administered 2017-06-27: 600 mg via ORAL
  Filled 2017-06-27: qty 8

## 2017-06-27 MED ORDER — VERAPAMIL HCL 2.5 MG/ML IV SOLN
INTRAVENOUS | Status: DC | PRN
  Administered 2017-06-27: 10 mL via INTRA_ARTERIAL

## 2017-06-27 MED ORDER — ASPIRIN EC 81 MG PO TBEC
81.0000 mg | DELAYED_RELEASE_TABLET | Freq: Every day | ORAL | Status: DC
  Filled 2017-06-27: qty 1

## 2017-06-27 MED ORDER — HEPARIN SODIUM (PORCINE) 1000 UNIT/ML IJ SOLN
INTRAMUSCULAR | Status: AC
  Filled 2017-06-27: qty 1

## 2017-06-27 MED ORDER — LIDOCAINE HCL (PF) 1 % IJ SOLN
INTRAMUSCULAR | Status: AC
  Filled 2017-06-27: qty 30

## 2017-06-27 MED ORDER — METOPROLOL TARTRATE 5 MG/5ML IV SOLN
INTRAVENOUS | Status: AC
  Filled 2017-06-27: qty 5

## 2017-06-27 MED ORDER — HEPARIN (PORCINE) IN NACL 2-0.9 UNITS/ML
INTRAMUSCULAR | Status: AC | PRN
  Administered 2017-06-27 (×2): 500 mL

## 2017-06-27 MED ORDER — IOPAMIDOL (ISOVUE-370) INJECTION 76%
INTRAVENOUS | Status: AC
  Filled 2017-06-27: qty 100

## 2017-06-27 MED ORDER — MIDAZOLAM HCL 2 MG/2ML IJ SOLN
INTRAMUSCULAR | Status: DC | PRN
  Administered 2017-06-27 (×2): 1 mg via INTRAVENOUS

## 2017-06-27 MED ORDER — SODIUM CHLORIDE 0.9% FLUSH
3.0000 mL | Freq: Two times a day (BID) | INTRAVENOUS | Status: DC
  Administered 2017-06-27: 21:00:00 3 mL via INTRAVENOUS

## 2017-06-27 MED ORDER — NITROGLYCERIN IN D5W 200-5 MCG/ML-% IV SOLN
INTRAVENOUS | Status: AC | PRN
  Administered 2017-06-27: 20 ug/min via INTRAVENOUS

## 2017-06-27 MED ORDER — SODIUM CHLORIDE 0.9 % WEIGHT BASED INFUSION
3.0000 mL/kg/h | INTRAVENOUS | Status: DC
  Administered 2017-06-27: 3 mL/kg/h via INTRAVENOUS

## 2017-06-27 MED ORDER — CLOPIDOGREL BISULFATE 75 MG PO TABS
75.0000 mg | ORAL_TABLET | Freq: Every day | ORAL | Status: DC
  Filled 2017-06-27: qty 1

## 2017-06-27 MED ORDER — IOPAMIDOL (ISOVUE-370) INJECTION 76%
INTRAVENOUS | Status: DC | PRN
  Administered 2017-06-27: 80 mL via INTRAVENOUS

## 2017-06-27 MED ORDER — ONDANSETRON HCL 4 MG/2ML IJ SOLN: 4.0000 mg | Freq: Four times a day (QID) | INTRAMUSCULAR | Status: DC | PRN

## 2017-06-27 MED ORDER — SODIUM CHLORIDE 0.9 % WEIGHT BASED INFUSION: 1.0000 mL/kg/h | INTRAVENOUS | Status: DC

## 2017-06-27 MED ORDER — NITROGLYCERIN IN D5W 200-5 MCG/ML-% IV SOLN: 0.0000 ug/min | INTRAVENOUS | Status: DC

## 2017-06-27 MED ORDER — ROSUVASTATIN CALCIUM 20 MG PO TABS
20.0000 mg | ORAL_TABLET | Freq: Every day | ORAL | Status: DC
  Administered 2017-06-27: 20 mg via ORAL
  Filled 2017-06-27: qty 1

## 2017-06-27 MED ORDER — HYDRALAZINE HCL 20 MG/ML IJ SOLN
INTRAMUSCULAR | Status: AC
  Filled 2017-06-27: qty 1

## 2017-06-27 MED ORDER — SODIUM CHLORIDE 0.9 % WEIGHT BASED INFUSION
3.0000 mL/kg/h | INTRAVENOUS | Status: AC
  Administered 2017-06-28: 3 mL/kg/h via INTRAVENOUS

## 2017-06-27 SURGICAL SUPPLY — 13 items
BAND ZEPHYR COMPRESS 30 LONG (HEMOSTASIS) ×2 IMPLANT
CATH INFINITI 5 FR 3DRC (CATHETERS) ×2 IMPLANT
CATH INFINITI 5 FR JL3.5 (CATHETERS) ×2 IMPLANT
CATH INFINITI 5FR AL1 (CATHETERS) ×2 IMPLANT
CATH INFINITI JR4 5F (CATHETERS) ×2 IMPLANT
CATH VISTA GUIDE 6FR XBLAD3.5 (CATHETERS) ×2 IMPLANT
GUIDEWIRE INQWIRE 1.5J.035X260 (WIRE) ×1 IMPLANT
INQWIRE 1.5J .035X260CM (WIRE) ×2
KIT HEART LEFT (KITS) ×2 IMPLANT
PACK CARDIAC CATHETERIZATION (CUSTOM PROCEDURE TRAY) ×2 IMPLANT
SHEATH RAIN RADIAL 21G 6FR (SHEATH) ×4 IMPLANT
TRANSDUCER W/STOPCOCK (MISCELLANEOUS) ×2 IMPLANT
TUBING CIL FLEX 10 FLL-RA (TUBING) ×2 IMPLANT

## 2017-06-27 NOTE — Interval H&P Note (Signed)
History and Physical Interval Note:  06/27/2017 3:14 PM  Mario Berg  has presented today for cardiac cath with the diagnosis of unstable angina. The various methods of treatment have been discussed with the patient and family. After consideration of risks, benefits and other options for treatment, the patient has consented to  Procedure(s): LEFT HEART CATH AND CORONARY ANGIOGRAPHY (N/A) as a surgical intervention .  The patient's history has been reviewed, patient examined, no change in status, stable for surgery.  I have reviewed the patient's chart and labs.  Questions were answered to the patient's satisfaction.    Cath Lab Visit (complete for each Cath Lab visit)  Clinical Evaluation Leading to the Procedure:   ACS: No.  Non-ACS:    Anginal Classification: CCS III  Anti-ischemic medical therapy: Minimal Therapy (1 class of medications)  Non-Invasive Test Results: No non-invasive testing performed  Prior CABG: No previous CABG         Lauree Chandler

## 2017-06-27 NOTE — Progress Notes (Signed)
Patient reported nicking his penis on the urinal, and was quite distraught.  He showed me several tiny specks of blood on a tissue.  I could not ascertain the source despite a thorough physical exam.  Examination of the urinal did reveal a small irregularity in the plastic at the seam near the opening.

## 2017-06-28 ENCOUNTER — Encounter (HOSPITAL_COMMUNITY): Payer: Self-pay | Admitting: Cardiovascular Disease

## 2017-06-28 ENCOUNTER — Telehealth: Payer: Self-pay | Admitting: *Deleted

## 2017-06-28 ENCOUNTER — Encounter (HOSPITAL_COMMUNITY)
Admission: RE | Discharge status: Against Medical Advice | Payer: Self-pay | Source: Ambulatory Visit | Attending: Cardiovascular Disease

## 2017-06-28 ENCOUNTER — Inpatient Hospital Stay (HOSPITAL_COMMUNITY): Payer: Medicare Other

## 2017-06-28 DIAGNOSIS — I10 Essential (primary) hypertension: Secondary | ICD-10-CM

## 2017-06-28 DIAGNOSIS — Z91199 Patient's noncompliance with other medical treatment and regimen due to unspecified reason: Secondary | ICD-10-CM

## 2017-06-28 DIAGNOSIS — I2510 Atherosclerotic heart disease of native coronary artery without angina pectoris: Secondary | ICD-10-CM | POA: Diagnosis present

## 2017-06-28 DIAGNOSIS — I200 Unstable angina: Secondary | ICD-10-CM

## 2017-06-28 DIAGNOSIS — Z9119 Patient's noncompliance with other medical treatment and regimen: Secondary | ICD-10-CM

## 2017-06-28 DIAGNOSIS — E7800 Pure hypercholesterolemia, unspecified: Secondary | ICD-10-CM

## 2017-06-28 LAB — CBC
HCT: 38 % — ABNORMAL LOW (ref 39.0–52.0)
HEMOGLOBIN: 12.6 g/dL — AB (ref 13.0–17.0)
MCH: 30.9 pg (ref 26.0–34.0)
MCHC: 33.2 g/dL (ref 30.0–36.0)
MCV: 93.1 fL (ref 78.0–100.0)
Platelets: 185 10*3/uL (ref 150–400)
RBC: 4.08 MIL/uL — ABNORMAL LOW (ref 4.22–5.81)
RDW: 13.9 % (ref 11.5–15.5)
WBC: 7.7 10*3/uL (ref 4.0–10.5)

## 2017-06-28 LAB — BASIC METABOLIC PANEL
ANION GAP: 9 (ref 5–15)
BUN: 12 mg/dL (ref 6–20)
CO2: 22 mmol/L (ref 22–32)
Calcium: 8.6 mg/dL — ABNORMAL LOW (ref 8.9–10.3)
Chloride: 110 mmol/L (ref 101–111)
Creatinine, Ser: 0.95 mg/dL (ref 0.61–1.24)
GFR calc Af Amer: >60 mL/min (ref >60)
GFR calc non Af Amer: >60 mL/min (ref >60)
GLUCOSE: 105 mg/dL — AB (ref 65–99)
Potassium: 3.4 mmol/L — ABNORMAL LOW (ref 3.5–5.1)
Sodium: 141 mmol/L (ref 135–145)

## 2017-06-28 SURGERY — CORONARY ATHERECTOMY
Anesthesia: LOCAL

## 2017-06-28 MED ORDER — ACETAMINOPHEN 325 MG PO TABS: 650.0000 mg | ORAL_TABLET | Freq: Four times a day (QID) | ORAL | Status: DC | PRN

## 2017-06-28 MED ORDER — POTASSIUM CHLORIDE CRYS ER 20 MEQ PO TBCR
40.0000 meq | EXTENDED_RELEASE_TABLET | Freq: Once | ORAL | Status: AC
  Administered 2017-06-28: 40 meq via ORAL
  Filled 2017-06-28: qty 2

## 2017-06-28 MED ORDER — ALPRAZOLAM 0.25 MG PO TABS
0.2500 mg | ORAL_TABLET | Freq: Two times a day (BID) | ORAL | Status: DC | PRN
  Filled 2017-06-28: qty 1

## 2017-06-28 MED ORDER — NITROGLYCERIN 0.4 MG SL SUBL: SUBLINGUAL_TABLET | SUBLINGUAL | 2 refills | Status: DC

## 2017-06-28 MED ORDER — HYDRALAZINE HCL 20 MG/ML IJ SOLN: 10.0000 mg | Freq: Four times a day (QID) | INTRAMUSCULAR | Status: DC | PRN

## 2017-06-28 MED ORDER — LISINOPRIL 40 MG PO TABS: 40.0000 mg | ORAL_TABLET | Freq: Two times a day (BID) | ORAL | Status: DC

## 2017-06-28 MED ORDER — ROSUVASTATIN CALCIUM 20 MG PO TABS: 20.0000 mg | ORAL_TABLET | Freq: Every day | ORAL | 3 refills | Status: DC

## 2017-06-28 MED ORDER — LISINOPRIL 40 MG PO TABS: 40.0000 mg | ORAL_TABLET | Freq: Two times a day (BID) | ORAL | 11 refills | Status: DC

## 2017-06-28 MED FILL — Heparin Sod (Porcine)-NaCl IV Soln 1000 Unit/500ML-0.9%: INTRAVENOUS | Qty: 1000 | Status: AC

## 2017-06-28 MED FILL — Lidocaine HCl Local Preservative Free (PF) Inj 1%: INTRAMUSCULAR | Qty: 30 | Status: AC

## 2017-06-28 MED FILL — Nitroglycerin IV Soln 200 MCG/ML in D5W: INTRAVENOUS | Qty: 250 | Status: AC

## 2017-06-28 NOTE — Progress Notes (Signed)
Patient is refusing to wear his BP cuff and his BP has been elevated since BSR. Patient also is refusing his echo. Luke PA on floor and is made aware and goes to bedside at this time. At 0735 BP=228/84 increased nitro gtt from 34mcg to 84mcg. At [place] BP=212/66 and increased Nitro gtt to 18mcg. At 0805 patient;s BP=192/68. Patient refuses to allow a blood pressure to be checked. Luke PA states "do not give lisinopril and given IV Hydralazine, then repeat blood pressure in 45mins. Stay out of patient's room so he can calm down. Patient needs blood pressure down for procedure today. Give a dose of xanax as well." Will medicate per order.

## 2017-06-28 NOTE — Progress Notes (Addendum)
Progress Note  Patient Name: Mario Berg Date of Encounter: 06/28/2017  Primary Cardiologist: Dr Burt Knack  Subjective   Very upset- doesn't want any more interruptions. He says his B/P has been checked every 5 minutes and he thinks he is on the verge of a heart attack.  Inpatient Medications    Scheduled Meds: . aspirin EC  81 mg Oral Daily  . clopidogrel  75 mg Oral Daily  . lisinopril  40 mg Oral BID  . rosuvastatin  20 mg Oral q1800  . sodium chloride flush  3 mL Intravenous Q12H  . sodium chloride flush  3 mL Intravenous Q12H   Continuous Infusions: . sodium chloride    . sodium chloride    . sodium chloride 1 mL/kg/hr (06/28/17 0653)  . nitroGLYCERIN 50 mcg/min (06/28/17 0748)   PRN Meds: sodium chloride, sodium chloride, acetaminophen, ALPRAZolam, hydrALAZINE, ondansetron (ZOFRAN) IV, sodium chloride, sodium chloride flush, sodium chloride flush   Vital Signs    Vitals:   06/28/17 0700 06/28/17 0735 06/28/17 0745 06/28/17 0805  BP: (!) 194/71 (!) 228/84 (!) 212/66 (!) 192/68  Pulse:    64  Resp: 16 14 18 15   Temp:    97.9 F (36.6 C)  TempSrc:    Oral  SpO2: 93% 96%    Weight:      Height:        Intake/Output Summary (Last 24 hours) at 06/28/2017 0857 Last data filed at 06/28/2017 0644 Gross per 24 hour  Intake 478.91 ml  Output -  Net 478.91 ml   Filed Weights   06/27/17 1204 06/28/17 0330  Weight: 163 lb (73.9 kg) 172 lb 3.2 oz (78.1 kg)    Telemetry    NSR, PACs, PVCs - Personally Reviewed  ECG    None new - Personally Reviewed  Physical Exam  Limited exam secondary to pt anxiety GEN: No acute distress.   Neck: No JVD MS: No edema; No deformity. Neuro:  Nonfocal  Psych: Normal affect   Labs    Chemistry Recent Labs  Lab 06/27/17 1326 06/28/17 0303  NA 141 141  K 3.9 3.4*  CL 109 110  CO2 22 22  GLUCOSE 106* 105*  BUN 8 12  CREATININE 0.88 0.95  CALCIUM 9.2 8.6*  GFRNONAA >60 >60  GFRAA >60 >60  ANIONGAP 10 9      Hematology Recent Labs  Lab 06/27/17 1326 06/28/17 0303  WBC 8.5 7.7  RBC 4.48 4.08*  HGB 14.0 12.6*  HCT 41.7 38.0*  MCV 93.1 93.1  MCH 31.3 30.9  MCHC 33.6 33.2  RDW 13.8 13.9  PLT 200 185    Cardiac EnzymesNo results for input(s): TROPONINI in the last 168 hours. No results for input(s): TROPIPOC in the last 168 hours.   BNPNo results for input(s): BNP, PROBNP in the last 168 hours.   DDimer No results for input(s): DDIMER in the last 168 hours.   Radiology    No results found.  Cardiac Studies   See Cath 06/28/17  Patient Profile     77 y.o. male seen for pre op clearance prior to surgery for head and neck cancer. Cath revealed high grade LAD disease and CTO RCA with L-R collaterals. He is for LAD PCI  Assessment & Plan    Uncontrolled HTN- H/O difficult to control B/P and multiple drug intolerances. IV NTG increased, will add a Xanax now and IV hydralazine PRN  Anxiety- the pt is upset and making his HTN  worse, long discussion with patient explaining why we are watching his B/P so closely. Will try and limit room visits by staff as much as possible.  CAD- as above for PCI then one month of DAPT therapy  Head and neck cancer- surgery to follow a month of DAPT  Plan: Will try and get his B/P under control so he can get his PCI today.    For questions or updates, please contact [place] Please consult [email or web address] for contact info under Cardiology/STEMI.      Angelena Form, PA-C  06/28/2017, 8:57 AM

## 2017-06-28 NOTE — Telephone Encounter (Signed)
Pt was on TCM report admitted 06/27/17 for unstable Angina. Ptneeds resection of head and neck cancer and was admitted 06/27/17 for diagnostic cath. Cath showed CTO of his RCA and high grade LAD disease. The plan was for LAD PCI with DES followed by one month of DAPT, then neck surgery. The morning of 4/26- the day he was scheduled to have his PCI- the pt was upset saying he hadn't rested well secondary to multiple interruptions by staff. His B/P had been markedly elevated and IV NTG was being titrated to try and get this dow.Pt decided he was leaving against medical advice. Dr [place] came and spoke with the patient but the patient insited on leaving and was notified this would be against medical advice...Mario Berg

## 2017-06-28 NOTE — Progress Notes (Signed)
Patient states he wants to leave and go home and rest. Patient refuses to take his medication and refuses to have procedure performed. [name] PA notified and states Dr Martinique is coming up to see him. Patient has been informed and states :I want these things out so I can go." Things are IVs. Patient has been educated on the importance of taking his medicine and having procedure performed, teach back is effective, patient understands and just wants to go home.

## 2017-06-28 NOTE — Discharge Summary (Signed)
Discharge Summary    Patient ID: Mario Berg,  MRN: [id number], DOB/AGE: Sep 01, 1940 77 y.o.  Admit date: 06/27/2017 Discharge date: 06/28/2017  Primary Care Provider: Cassandria Anger Primary Cardiologist: Dr Julianne Handler  Discharge Diagnoses    Principal Problem:   Unstable angina [facility]) Active Problems:   Dyslipidemia   Accelerated hypertension   Secondary and unspecified malignant neoplasm of lymph nodes of head, face and neck (HCC)   CAD (coronary artery disease)   Non-compliance with treatment   Allergies Allergies  Allergen Reactions  . Penicillins Hives and Swelling    Swelling of the hands  Has patient had a PCN reaction causing immediate rash, facial/tongue/throat swelling, SOB or lightheadedness with hypotension: Yes Has patient had a PCN reaction causing severe rash involving mucus membranes or skin necrosis: Yes Has patient had a PCN reaction that required hospitalization No Has patient had a PCN reaction occurring within the last 10 years: No If all of the above answers are "NO", then may proceed with Cephalosporin use.   . Atorvastatin Other (See Comments)    Diverticulosis per patient  . Furosemide Other (See Comments)    Diverticulosis per patient  . Bystolic [Nebivolol Hcl] Other (See Comments)    Bladder burning  . Clams [employer] Allergy] Other (See Comments)    Poison the body   . Clonidine Derivatives Other (See Comments)    weak  . Gemfibrozil Other (See Comments)    Diverticulitis  . Metoprolol Tartrate     REACTION: urinating too much  . Sulfonamide Derivatives Hives and Swelling  . Tekturna [Aliskiren Fumarate] Other (See Comments)    Gout   . Terazosin Hcl     REACTION: ringing in the ears  . Verapamil     REACTION: Wt gain    Diagnostic Studies/Procedures    Cath 06/27/17 _____________   History of Present Illness     77 y.o. male who is being seen for preop clearance 06/25/17 at the request of [name], [name], MD.   Patient has a history of unstable angina.  He was seen in 2017 and cath was recomended but he declined.  He was started on isosorbide and aspirin.  He has refused cholesterol medications.  His angina improved after taking the isosorbide. He now needs resection of head and neck cancer and was admitted 06/27/17 for diagnostic cath. Cath showed CTO of his RCA and high grade LAD disease. The plan was for LAD PCI with DES followed by one month of DAPT, then neck surgery.   The morning of 4/26- the day he was scheduled to have his PCI- the pt was upset saying he hadn't rested well secondary to multiple interruptions by staff. His B/P had been markedly elevated and IV NTG was being titrated to try and get this dow. I sqw the patient and had a long discussion, promising to limit staff visits and explained why we needed to get his B/P under control. He initially seemed to accept this but the decided he was leaving against medical advice. Dr Martinique came and spoke with the patient but the patient insited on leaving and was notified this would be against medical advice.     Hospital Course       _____________  Discharge Vitals Blood pressure (!) 172/82, pulse 68, temperature 97.9 F (36.6 C), temperature source Oral, resp. rate 15, height 5\' 8"  (1.727 m), weight 172 lb 3.2 oz (78.1 kg), SpO2 96 %.  Filed Weights   06/27/17  1204 06/28/17 0330  Weight: 163 lb (73.9 kg) 172 lb 3.2 oz (78.1 kg)    Labs & Radiologic Studies    CBC Recent Labs    06/27/17 1326 06/28/17 0303  WBC 8.5 7.7  HGB 14.0 12.6*  HCT 41.7 38.0*  MCV 93.1 93.1  PLT 200 938   Basic Metabolic Panel Recent Labs    06/27/17 1326 06/28/17 0303  NA 141 141  K 3.9 3.4*  CL 109 110  CO2 22 22  GLUCOSE 106* 105*  BUN 8 12  CREATININE 0.88 0.95  CALCIUM 9.2 8.6*   Liver Function Tests No results for input(s): AST, ALT, ALKPHOS, BILITOT, PROT, ALBUMIN in the last 72 hours. No results for input(s): LIPASE, AMYLASE in the last  72 hours. Cardiac Enzymes No results for input(s): CKTOTAL, CKMB, CKMBINDEX, TROPONINI in the last 72 hours. BNP Invalid input(s): POCBNP D-Dimer No results for input(s): DDIMER in the last 72 hours. Hemoglobin A1C No results for input(s): HGBA1C in the last 72 hours. Fasting Lipid Panel No results for input(s): CHOL, HDL, LDLCALC, TRIG, CHOLHDL, LDLDIRECT in the last 72 hours. Thyroid Function Tests No results for input(s): TSH, T4TOTAL, T3FREE, THYROIDAB in the last 72 hours.  Invalid input(s): FREET3 _____________  No results found. Disposition   Pt is being discharged home today in good condition.  Follow-up Plans & Appointments    Follow-up Information    Burnell Blanks, MD Follow up.   Specialty:  Cardiology Why:  Call the office for an appointment if you wish follow up Contact information: [place]. [address] [place] [address] [phone]            Discharge Medications   Allergies as of 06/28/2017      Reactions   Penicillins Hives, Swelling   Swelling of the hands  Has patient had a PCN reaction causing immediate rash, facial/tongue/throat swelling, SOB or lightheadedness with hypotension: Yes Has patient had a PCN reaction causing severe rash involving mucus membranes or skin necrosis: Yes Has patient had a PCN reaction that required hospitalization No Has patient had a PCN reaction occurring within the last 10 years: No If all of the above answers are "NO", then may proceed with Cephalosporin use.   Atorvastatin Other (See Comments)   Diverticulosis per patient   Furosemide Other (See Comments)   Diverticulosis per patient   Bystolic [nebivolol Hcl] Other (See Comments)   Bladder burning   Clams [shellfish Allergy] Other (See Comments)   Poison the body   Clonidine Derivatives Other (See Comments)   weak   Gemfibrozil Other (See Comments)   Diverticulitis   Metoprolol Tartrate    REACTION: urinating too much    Sulfonamide Derivatives Hives, Swelling   Tekturna [aliskiren Fumarate] Other (See Comments)   Gout   Terazosin Hcl    REACTION: ringing in the ears   Verapamil    REACTION: Wt gain      Medication List    STOP taking these medications   indomethacin 25 MG capsule Commonly known as:  INDOCIN     TAKE these medications   acetaminophen 325 MG tablet Commonly known as:  TYLENOL Take 2 tablets (650 mg total) by mouth every 6 (six) hours as needed for mild pain or headache.   aspirin EC 81 MG tablet Take 1 tablet (81 mg total) by mouth daily.   Co Q 10 100 MG Caps Take 100 mg by mouth daily.   EYE WASH 99.05 %  Soln Apply 1 application to eye daily.   Fish Oil 1200 MG Caps Take 1,200 mg by mouth daily.   isosorbide mononitrate 30 MG 24 hr tablet Commonly known as:  IMDUR Take 1 tablet (30 mg total) by mouth daily.   lisinopril 40 MG tablet Commonly known as:  PRINIVIL,ZESTRIL Take 1 tablet (40 mg total) by mouth 2 (two) times daily. What changed:    medication strength  how much to take   multivitamin tablet Take 1 tablet by mouth daily.   nitroGLYCERIN 0.4 MG SL tablet Commonly known as:  NITROSTAT PLACE 1 TABLET UNDER THE TONGUE EVERY 5 MINUTES AS NEEDED FOR CHEST PAIN   oxymetazoline 0.05 % nasal spray Commonly known as:  AFRIN Place 1 spray into both nostrils 2 (two) times daily as needed for congestion.   rosuvastatin 20 MG tablet Commonly known as:  CRESTOR Take 1 tablet (20 mg total) by mouth daily at 6 PM.         Outstanding Labs/Studies     Duration of Discharge Encounter   Greater than 30 minutes including physician time.  Signed, [address], [place] 06/28/2017, 10:18 AM

## 2017-06-28 NOTE — Progress Notes (Signed)
[facility] BAND REMOVAL  LOCATION:    right radial  DEFLATED PER PROTOCOL:    Yes.    TIME BAND OFF / DRESSING APPLIED:    1930   SITE UPON ARRIVAL:    Level 0  SITE AFTER BAND REMOVAL:    Level 0  CIRCULATION SENSATION AND MOVEMENT:    Within Normal Limits   Yes.    COMMENTS:   Pt. Tolerated  well

## 2017-06-28 NOTE — Progress Notes (Signed)
At Mario Berg, [name] PA obtained patient's BP=172/82 and states "Do not give Hydralazine at this time, given Xanax and check BP before patient goes to cath lab."

## 2017-06-28 NOTE — Discharge Instructions (Signed)
Angina Pectoris Angina pectoris is a very bad feeling in the chest, neck, or arm. Your doctor may call it angina. There are four types of angina. Angina is caused by a lack of blood in the middle and thickest layer of the heart wall (myocardium). Angina may feel like a crushing or squeezing pain in the chest. It may feel like tightness or heavy pressure in the chest. Some people say it feels like gas, heartburn, or indigestion. Some people have symptoms other than pain. These include:  Shortness of breath.  Cold sweats.  Feeling sick to your stomach (nausea).  Feeling light-headed.  Many women have chest discomfort and some of the other symptoms. However, women often have different symptoms, such as:  Feeling tired (fatigue).  Feeling nervous for no reason.  Feeling weak for no reason.  Dizziness or fainting.  Women may have angina without any symptoms. Follow these instructions at home:  Take medicines only as told by your doctor.  Take care of other health issues as told by your doctor. These include: ? High blood pressure (hypertension). ? Diabetes.  Follow a heart-healthy diet. Your doctor can help you to choose healthy food options and make changes.  Talk to your doctor to learn more about healthy cooking methods and use them. These include: ? Roasting. ? Grilling. ? Broiling. ? Baking. ? Poaching. ? Steaming. ? Stir-frying.  Follow an exercise program approved by your doctor.  Keep a healthy weight. Lose weight as told by your doctor.  Rest when you are tired.  Learn to manage stress.  Do not use any tobacco, such as cigarettes, chewing tobacco, or electronic cigarettes. If you need help quitting, ask your doctor.  If you drink alcohol, and your doctor says it is okay, limit yourself to no more than 1 drink per day. One drink equals 12 ounces of beer, 5 ounces of wine, or 1 ounces of hard liquor.  Stop illegal drug use.  Keep all follow-up visits as told  by your doctor. This is important. Do not take these medicines unless your doctor says that you can:  Nonsteroidal anti-inflammatory drugs (NSAIDs). These include: ? Ibuprofen. ? Naproxen. ? Celecoxib.  Vitamin supplements that have vitamin A, vitamin E, or both.  Hormone therapy that contains estrogen with or without progestin.  Get help right away if:  You have pain in your chest, neck, arm, jaw, stomach, or back that: ? Lasts more than a few minutes. ? Comes back. ? Does not get better after you take medicine under your tongue (sublingual nitroglycerin).  You have any of these symptoms for no reason: ? Gas, heartburn, or indigestion. ? Sweating a lot. ? Shortness of breath or trouble breathing. ? Feeling sick to your stomach or throwing up. ? Feeling more tired than usual. ? Feeling nervous or worrying more than usual. ? Feeling weak. ? Diarrhea.  You are suddenly dizzy or light-headed.  You faint or pass out. These symptoms may be an emergency. Do not wait to see if the symptoms will go away. Get medical help right away. Call your local emergency services (911 in the [place] Do not drive yourself to the hospital. This information is not intended to replace advice given to you by your health care provider. Make sure you discuss any questions you have with your health care provider. Document Released: 08/08/2007 Document Revised: 07/28/2015 Document Reviewed: 06/23/2013 Elsevier Interactive Patient Education  2017 Elsevier Inc.  

## 2017-07-02 ENCOUNTER — Telehealth: Payer: Self-pay | Admitting: *Deleted

## 2017-07-02 NOTE — Telephone Encounter (Signed)
Oncology Nurse Navigator Documentation  Spoke with Mr. Dutch who acknowledged difficulty with BP monitoring last week during hospitalization for stent placement which was cancelled d/t his DC AMA.  He agreed to return to see Dr. Isidore Moos to discuss RT since surgery no longer a treatment option.  I later called him to provide 9:00 appt this Friday.  He voiced understanding I will coordinate CT SIM in conjunction with this appt.  Gayleen Orem, RN, BSN Head & Neck Oncology Nurse [place] at [place] [phone]

## 2017-07-03 NOTE — Progress Notes (Signed)
Head and Neck Cancer Location of Tumor / Histology:  04/05/17 Diagnosis ATYPICAL CELLS PRESENT  Patient presented  months ago with symptoms of: He had a "knot" over his Left neck area for over one year.   Biopsies of left neck mass revealed: atypical cells  Nutrition Status Yes No Comments  Weight changes? [x]  []  He reports losing some weight recently. He unsure of the exact amount.   Swallowing concerns? []  [x]  He denies problems swallowing. He does report thick phlegm.   PEG? []  [x]     Referrals Yes No Comments  Social Work? []  [x]    Dentistry? []  [x]    Swallowing therapy? []  [x]    Nutrition? []  [x]    Med/Onc? []  [x]     Safety Issues Yes No Comments  Prior radiation? []  [x]    Pacemaker/ICD? []  [x]    Possible current pregnancy? []  [x]    Is the patient on methotrexate? []  [x]     Tobacco/Marijuana/Snuff/ETOH use: He is a former smoker, quitting in 62. He does smoke marijuana nightly to help him fall asleep. He reports that he drinks 15-20 beers weekly.   Past/Anticipated interventions by otolaryngology, if any:  FNA by Dr. Lucia Gaskins 04/05/17  Past/Anticipated interventions by medical oncology, if any:  None   Current Complaints / other details:   05/03/17 PET IMPRESSION: 1. The left neck mass just below the parotid gland and superficial to the sternocleidomastoid muscle is hypermetabolic with maximum SUV of 12.0. No other definite focus of abnormal metabolic activity is identified in the neck, or in the remainder of the head, chest, abdomen, pelvis, or extremities. 2. Other imaging findings of potential clinical significance: Aortic Atherosclerosis (ICD10-I70.0). Coronary atherosclerosis. Colonic diverticulosis. Bilateral pars defects at L5. [place] along the left tibial tubercle compatible with prior Osgood-Schlatter disease, with some accentuated activity compatible with active inflammation (there is also a small left knee joint  effusion).  Pt will no longer have surgery, and will need to discuss radiation again today with Dr. Isidore Moos.   BP (!) 160/76   Pulse 61   Temp 97.8 F (36.6 C)   Resp 20   Ht 5\' 8"  (1.727 m)   Wt 163 lb 3.2 oz (74 kg)   SpO2 100% Comment: room air  BMI 24.81 kg/m    Wt Readings from Last 3 Encounters:  07/05/17 163 lb 3.2 oz (74 kg)  06/28/17 172 lb 3.2 oz (78.1 kg)  06/25/17 161 lb 12.8 oz (73.4 kg)

## 2017-07-03 NOTE — Progress Notes (Signed)
Error, duplicate

## 2017-07-04 ENCOUNTER — Telehealth: Payer: Self-pay | Admitting: *Deleted

## 2017-07-04 NOTE — Telephone Encounter (Signed)
Oncology Nurse Navigator Documentation  LVMM for Mr. Baugh informing of 11:00 CT [facility] appt tomorrow following appt with Dr. Isidore Moos and his decision to move forward with RT.  Encouraged call-back if questions.  Gayleen Orem, RN, BSN Head & Neck Oncology Nurse [place] at [place] [phone]

## 2017-07-05 ENCOUNTER — Encounter: Payer: Self-pay | Admitting: Radiation Oncology

## 2017-07-05 ENCOUNTER — Ambulatory Visit
Admission: RE | Admit: 2017-07-05 | Discharge: 2017-07-05 | Disposition: A | Discharge status: Home / Self Care | Payer: Medicare Other | Source: Ambulatory Visit | Attending: Radiation Oncology | Admitting: Radiation Oncology

## 2017-07-05 ENCOUNTER — Other Ambulatory Visit: Payer: Self-pay

## 2017-07-05 ENCOUNTER — Encounter (HOSPITAL_COMMUNITY): Admission: RE | Payer: Self-pay | Source: Ambulatory Visit

## 2017-07-05 ENCOUNTER — Inpatient Hospital Stay (HOSPITAL_COMMUNITY): Admission: RE | Admit: 2017-07-05 | Payer: Medicare Other | Source: Ambulatory Visit | Admitting: Otolaryngology

## 2017-07-05 ENCOUNTER — Encounter: Payer: Self-pay | Admitting: *Deleted

## 2017-07-05 VITALS — BP 160/76 | HR 61 | Temp 97.8°F | Resp 20 | Ht 68.0 in | Wt 163.2 lb

## 2017-07-05 DIAGNOSIS — C44310 Basal cell carcinoma of skin of unspecified parts of face: Secondary | ICD-10-CM | POA: Diagnosis not present

## 2017-07-05 DIAGNOSIS — C770 Secondary and unspecified malignant neoplasm of lymph nodes of head, face and neck: Secondary | ICD-10-CM | POA: Insufficient documentation

## 2017-07-05 DIAGNOSIS — R634 Abnormal weight loss: Secondary | ICD-10-CM | POA: Insufficient documentation

## 2017-07-05 DIAGNOSIS — Z7982 Long term (current) use of aspirin: Secondary | ICD-10-CM | POA: Diagnosis not present

## 2017-07-05 DIAGNOSIS — C801 Malignant (primary) neoplasm, unspecified: Secondary | ICD-10-CM | POA: Diagnosis not present

## 2017-07-05 DIAGNOSIS — R630 Anorexia: Secondary | ICD-10-CM | POA: Diagnosis not present

## 2017-07-05 DIAGNOSIS — Z510 Encounter for antineoplastic radiation therapy: Secondary | ICD-10-CM | POA: Diagnosis not present

## 2017-07-05 DIAGNOSIS — Z79899 Other long term (current) drug therapy: Secondary | ICD-10-CM | POA: Diagnosis not present

## 2017-07-05 SURGERY — EXCISION, MASS, NECK
Anesthesia: General | Laterality: Left

## 2017-07-05 MED ORDER — SODIUM CHLORIDE 0.9% FLUSH
10.0000 mL | Freq: Once | INTRAVENOUS | Status: AC
  Administered 2017-07-05: 10 mL via INTRAVENOUS

## 2017-07-05 NOTE — Progress Notes (Signed)
Radiation Oncology         [phone] ________________________________  Name: Mario Berg MRN: [id number]  Date: 07/05/2017  DOB: [birth year]  Follow-Up Visit Note  CC: [name], [name], MD  Rozetta Nunnery, *  Diagnosis:      ICD-10-CM   1. Secondary and unspecified malignant neoplasm of lymph nodes of head, face and neck (HCC) C77.0     Skin cancer metastatic to neck  Cancer Staging Secondary and unspecified malignant neoplasm of lymph nodes of head, face and neck (HCC) Staging form: Cutaneous Carcinoma of the Head and Neck, AJCC 8th Edition - Clinical: Stage Unknown (cTX, cN2a, cM0) - Signed by Eppie Gibson, MD on 07/07/2017    CHIEF COMPLAINT:  Here for follow-up and surveillance of head and neck cancer  Narrative:  The patient returns today for follow-up to further discuss radiation therapy as he will no longer undergo surgery for his neck mass. He was going to undergo neck surgery by Dr Lucia Gaskins recently.  Dr Lucia Gaskins called me and reported that upon pre-op cardiology workup, it was determined he needed stenting for severe CAD.  Patient left AMA before this could be done.  Given that a month recovery would be needed from stenting before neck surgery would be feasible/safe, Dr Lucia Gaskins anticipates surgery wouldn't be possible until mid June at the earliest.  It was determined that urgent RT was best, given the patient's circumstances and rapid tumor progression.  On review of systems, the patient reports loss of appetite, baseline taste changes, and some weight loss recently. He denies dysphagia but does report thick phlegm that started about 6 months ago.     ALLERGIES:  is allergic to penicillins; atorvastatin; furosemide; bystolic [nebivolol hcl]; clams [shellfish allergy]; clonidine derivatives; gemfibrozil; metoprolol tartrate; sulfonamide derivatives; tekturna [aliskiren fumarate]; terazosin hcl; and verapamil.  Meds: Current Outpatient Medications  Medication Sig  Dispense Refill  . acetaminophen (TYLENOL) 325 MG tablet Take 2 tablets (650 mg total) by mouth every 6 (six) hours as needed for mild pain or headache.    Marland Kitchen aspirin EC 81 MG tablet Take 1 tablet (81 mg total) by mouth daily. 100 tablet 3  . Cholecalciferol (VITAMIN D3) 1000 units CAPS Take by mouth.    . Coenzyme Q10 (CO Q 10) 100 MG CAPS Take 100 mg by mouth daily.    . indomethacin (INDOCIN) 25 MG capsule Take 25 mg by mouth 3 (three) times daily as needed.    . isosorbide mononitrate (IMDUR) 30 MG 24 hr tablet Take 1 tablet (30 mg total) by mouth daily. 90 tablet 3  . lisinopril (PRINIVIL,ZESTRIL) 40 MG tablet Take 1 tablet (40 mg total) by mouth 2 (two) times daily. 60 tablet 11  . Multiple Vitamin (MULTIVITAMIN) tablet Take 1 tablet by mouth daily.     . nitroGLYCERIN (NITROSTAT) 0.4 MG SL tablet PLACE 1 TABLET UNDER THE TONGUE EVERY 5 MINUTES AS NEEDED FOR CHEST PAIN 20 tablet 2  . Omega-3 Fatty Acids (FISH OIL) 1200 MG CAPS Take 1,200 mg by mouth daily.     Marland Kitchen Ophthalmic Irrigation Solution (EYE WASH) 99.05 % SOLN Apply 1 application to eye daily.    Marland Kitchen oxymetazoline (AFRIN) 0.05 % nasal spray Place 1 spray into both nostrils 2 (two) times daily as needed for congestion.    . rosuvastatin (CRESTOR) 20 MG tablet Take 1 tablet (20 mg total) by mouth daily at 6 PM. (Patient not taking: Reported on 07/05/2017) 90 tablet 3   No current  facility-administered medications for this encounter.     Physical Findings: Wt Readings from Last 3 Encounters:  07/05/17 163 lb 3.2 oz (74 kg)  06/28/17 172 lb 3.2 oz (78.1 kg)  06/25/17 161 lb 12.8 oz (73.4 kg)    height is 5\' 8"  (1.727 m) and weight is 163 lb 3.2 oz (74 kg). His temperature is 97.8 F (36.6 C). His blood pressure is 160/76 (abnormal) and his pulse is 61. His respiration is 20 and oxygen saturation is 100%.  General: Alert and oriented, in no acute distress HEENT: Oropharynx is clear. Tongue is midline. Neck: Neck is notable for a mobile  mass in the level II/III region of his left neck which is somewhat triangular in shape. It is about 4.5 fingerbreadths in greatest dimension. No skin erosion. No masses palpated in the contralateral neck on the right. No other massses appreciated in the rest of the neck.   Lab Findings: Lab Results  Component Value Date   WBC 7.7 06/28/2017   HGB 12.6 (L) 06/28/2017   HCT 38.0 (L) 06/28/2017   MCV 93.1 06/28/2017   PLT 185 06/28/2017    Lab Results  Component Value Date   TSH 1.83 03/26/2017    Radiographic Findings: No results found.  Impression/Plan:    1) Head and Neck Cancer Status: progressive disease in left neck.  Still without definitive histology.  Was going to have neck dissection by ENT.  Left AMA before pre-op cardiac stenting could be done  Today, I talked to the patient about the findings and work-up thus far. We discussed the patient's diagnosis of neck tumor and general treatment for this, highlighting the role of radiotherapy in the management. We discussed the available radiation techniques, and focused on the details of logistics and delivery.    We discussed that SURGERY is the standard up front treatment, but now this cannot be done in a timely manner for his aggressive disease.  We discussed the alternative of 4-7 weeks of radiotherapy at this point.  While 7 weeks is more standard, a hypofractionated 4 week course might be a more realistic course for him. I am concerned he would quit his treatment prematurely, especially if it exceeds 7mo. He agrees.  We discussed the risks, benefits, and side effects of radiotherapy. No guarantees of treatment were given. A consent form was signed and placed in the patient's medical record. He is agreeable to proceed with CT simulation/planning today with treatment to begin in approximately 1 week. We discussed a 4 week course of hypofractionated radiation therapy to the neck mass and surrounding lymph nodes for disease control -  he likes this proposal  2) Nutritional Status: The patient has loss of appetite, baseline taste changes, and weight loss. Will refer him to nutritionist  3) swallowing: No issues with swallowing. Does have baseline thick phlegm.   4) Thyroid function: WNL Lab Results  Component Value Date   TSH 1.83 03/26/2017    5) Other: Transportation issues - refer to SW  I spent 30 minutes face to face with the patient and more than 50% of that time was spent in counseling and/or coordination of care. _____________________________________   Eppie Gibson, MD  This document serves as a record of services personally performed by Eppie Gibson, MD. It was created on her behalf by Rae Lips, a trained medical scribe. The creation of this record is based on the scribe's personal observations and the provider's statements to them. This document has been checked and  approved by the attending provider.

## 2017-07-05 NOTE — Addendum Note (Signed)
Encounter addended by: Heywood Footman, RN on: 07/05/2017 5:37 PM  Actions taken: MAR administration accepted

## 2017-07-05 NOTE — Addendum Note (Signed)
Encounter addended by: Waleed Dettman, Stephani Police, RN on: 07/05/2017 5:26 PM  Actions taken: LDA properties accepted

## 2017-07-05 NOTE — Addendum Note (Signed)
Encounter addended by: Jorian Willhoite, Stephani Police, RN on: 07/05/2017 5:28 PM  Actions taken: LDA properties accepted

## 2017-07-05 NOTE — Addendum Note (Signed)
Encounter addended by: Mandalyn Pasqua, Stephani Police, RN on: 07/05/2017 5:37 PM  Actions taken: Order list changed, Diagnosis association updated

## 2017-07-05 NOTE — Progress Notes (Signed)
Head and Neck Cancer Simulation, IMRT treatment planning note   Outpatient  Diagnosis:    ICD-10-CM   1. Secondary and unspecified malignant neoplasm of lymph nodes of head, face and neck (HCC) C77.0     The patient was taken to the CT simulator and laid in the supine position on the table. An Aquaplast head and shoulder mask was custom fitted to the patient's anatomy. High-resolution CT axial imaging was obtained of the head and neck with contrast. I verified that the quality of the imaging is good for treatment planning. 1 Medically Necessary Treatment Device was fabricated and supervised by me: Aquaplast mask.   Treatment planning note I plan to treat the patient with IMRT. I plan to treat the patient's left neck nodes. I plan to treat to a total dose of 52 Gray in 20  fractions. Dose calculation was ordered from dosimetry.  IMRT planning Note  IMRT is medically necessary and an important modality to deliver adequate dose to the patient's at risk tissues while sparing the patient's normal structures, including the: esophagus, parotid tissue, mandible, brain stem, spinal cord, oral cavity, brachial plexus.  This justifies the use of IMRT in the patient's treatment.    -----------------------------------  Eppie Gibson, MD

## 2017-07-07 ENCOUNTER — Encounter: Payer: Self-pay | Admitting: Radiation Oncology

## 2017-07-07 ENCOUNTER — Other Ambulatory Visit: Payer: Self-pay | Admitting: Radiation Oncology

## 2017-07-07 DIAGNOSIS — C770 Secondary and unspecified malignant neoplasm of lymph nodes of head, face and neck: Secondary | ICD-10-CM

## 2017-07-08 ENCOUNTER — Telehealth: Payer: Self-pay | Admitting: *Deleted

## 2017-07-08 NOTE — Telephone Encounter (Signed)
Oncology Nurse Navigator Documentation  Mr. Hanner called with a couple of concerns: 1.  Cost of Rx issued by Dr. Isidore Moos.  He is not enrolled in Medicare Part D, requests future Rx prescribed/related to [facility] tmt be prescribed accordingly.  I indicated I would ask Financial Counseling to call him to see if he qualifies for [place]. 2.  Length of time on table when receiving RT and his ability to clear throat of secretions.  I explained he may be wearing mask for up to10 minutes, suggested he gargle/rinse right before tmt to manage.  Suggested he try this technique at home (rinse/gargle, lie flat on back for 10 minutes) to gauge efficacy.  Suggested another option may be to take mild sedative prior to tmt so he is better able to relax during tmt. He expressed appreciation for information.  Gayleen Orem, RN, BSN Head & Neck Oncology Nurse [place] at [place] [phone]

## 2017-07-08 NOTE — Progress Notes (Signed)
Oncology Nurse Navigator Documentation  Pt arrived today for further discussion with Dr Isidore Moos re RT for L neck tumors. He reported cardiac stent not placed last week d/t elevated BP, surgical removal of LN no longer an option. He agreed to plan for 4 wks RT. I accompanied him to [facility] for RT planning with open-face mask.  D/t IV difficulty, he was unable to have contrast as planned. Following SIM, I showed him LINAC 2 treatment area, explained arrival/preparation procedures. He was encouraged to call with questions/concerns prior to 5/14 [place].  Gayleen Orem, RN, BSN Head & Neck Oncology Nurse [place] at [place] [phone]

## 2017-07-09 ENCOUNTER — Encounter: Payer: Self-pay | Admitting: Radiation Oncology

## 2017-07-09 NOTE — Progress Notes (Signed)
Financial Counseling--contacted patient today--gave him information about our grant--he will bring in his income verification on 5/14 and apply for [place]

## 2017-07-12 DIAGNOSIS — C770 Secondary and unspecified malignant neoplasm of lymph nodes of head, face and neck: Secondary | ICD-10-CM | POA: Diagnosis not present

## 2017-07-12 DIAGNOSIS — Z510 Encounter for antineoplastic radiation therapy: Secondary | ICD-10-CM | POA: Diagnosis not present

## 2017-07-16 ENCOUNTER — Ambulatory Visit
Admission: RE | Admit: 2017-07-16 | Discharge: 2017-07-16 | Disposition: A | Discharge status: Home / Self Care | Payer: Medicare Other | Source: Ambulatory Visit | Attending: Radiation Oncology | Admitting: Radiation Oncology

## 2017-07-16 ENCOUNTER — Encounter: Payer: Self-pay | Admitting: *Deleted

## 2017-07-16 DIAGNOSIS — C44310 Basal cell carcinoma of skin of unspecified parts of face: Secondary | ICD-10-CM

## 2017-07-16 DIAGNOSIS — C770 Secondary and unspecified malignant neoplasm of lymph nodes of head, face and neck: Secondary | ICD-10-CM | POA: Diagnosis not present

## 2017-07-16 DIAGNOSIS — Z510 Encounter for antineoplastic radiation therapy: Secondary | ICD-10-CM | POA: Diagnosis not present

## 2017-07-16 MED ORDER — RADIAPLEXRX EX GEL
Freq: Once | CUTANEOUS | Status: AC
  Administered 2017-07-16: 17:00:00 via TOPICAL

## 2017-07-16 NOTE — Progress Notes (Signed)
Oncology Nurse Navigator Documentation  To provide support, encouragement and care continuity, met with Mario Berg for his initial  RT.  He was unaccompanied.  I reviewed the 2-step treatment process, answered questions.   He completed treatment without difficulty, denied questions/concerns.  I reviewed the registration/arrival procedure for subsequent treatments.  I provided him Epic appt calendar.  I escorted him to RadOnc for Post-SIM Ed with Rn Jennifer Malmfelt.  I encouraged him to call me with questions/concerns as tmts proceed.  Gayleen Orem, RN, BSN Head & Neck Oncology Nurse [place] at [place] [phone]

## 2017-07-16 NOTE — Progress Notes (Signed)

## 2017-07-17 ENCOUNTER — Ambulatory Visit
Admission: RE | Admit: 2017-07-17 | Discharge: 2017-07-17 | Disposition: A | Discharge status: Home / Self Care | Payer: Medicare Other | Source: Ambulatory Visit | Attending: Radiation Oncology | Admitting: Radiation Oncology

## 2017-07-17 DIAGNOSIS — Z510 Encounter for antineoplastic radiation therapy: Secondary | ICD-10-CM | POA: Diagnosis not present

## 2017-07-17 DIAGNOSIS — C770 Secondary and unspecified malignant neoplasm of lymph nodes of head, face and neck: Secondary | ICD-10-CM | POA: Diagnosis not present

## 2017-07-18 ENCOUNTER — Ambulatory Visit
Admission: RE | Admit: 2017-07-18 | Discharge: 2017-07-18 | Disposition: A | Discharge status: Home / Self Care | Payer: Medicare Other | Source: Ambulatory Visit | Attending: Radiation Oncology | Admitting: Radiation Oncology

## 2017-07-18 DIAGNOSIS — C770 Secondary and unspecified malignant neoplasm of lymph nodes of head, face and neck: Secondary | ICD-10-CM | POA: Diagnosis not present

## 2017-07-18 DIAGNOSIS — Z510 Encounter for antineoplastic radiation therapy: Secondary | ICD-10-CM | POA: Diagnosis not present

## 2017-07-19 ENCOUNTER — Ambulatory Visit
Admission: RE | Admit: 2017-07-19 | Discharge: 2017-07-19 | Disposition: A | Discharge status: Home / Self Care | Payer: Medicare Other | Source: Ambulatory Visit | Attending: Radiation Oncology | Admitting: Radiation Oncology

## 2017-07-19 DIAGNOSIS — Z510 Encounter for antineoplastic radiation therapy: Secondary | ICD-10-CM | POA: Diagnosis not present

## 2017-07-19 DIAGNOSIS — C770 Secondary and unspecified malignant neoplasm of lymph nodes of head, face and neck: Secondary | ICD-10-CM | POA: Diagnosis not present

## 2017-07-22 ENCOUNTER — Ambulatory Visit
Admission: RE | Admit: 2017-07-22 | Discharge: 2017-07-22 | Disposition: A | Discharge status: Home / Self Care | Payer: Medicare Other | Source: Ambulatory Visit | Attending: Radiation Oncology | Admitting: Radiation Oncology

## 2017-07-22 DIAGNOSIS — Z510 Encounter for antineoplastic radiation therapy: Secondary | ICD-10-CM | POA: Diagnosis not present

## 2017-07-22 DIAGNOSIS — C770 Secondary and unspecified malignant neoplasm of lymph nodes of head, face and neck: Secondary | ICD-10-CM | POA: Diagnosis not present

## 2017-07-23 ENCOUNTER — Ambulatory Visit
Admission: RE | Admit: 2017-07-23 | Discharge: 2017-07-23 | Disposition: A | Discharge status: Home / Self Care | Payer: Medicare Other | Source: Ambulatory Visit | Attending: Radiation Oncology | Admitting: Radiation Oncology

## 2017-07-23 DIAGNOSIS — Z510 Encounter for antineoplastic radiation therapy: Secondary | ICD-10-CM | POA: Diagnosis not present

## 2017-07-23 DIAGNOSIS — C770 Secondary and unspecified malignant neoplasm of lymph nodes of head, face and neck: Secondary | ICD-10-CM | POA: Diagnosis not present

## 2017-07-24 ENCOUNTER — Encounter: Payer: Self-pay | Admitting: *Deleted

## 2017-07-24 ENCOUNTER — Ambulatory Visit
Admission: RE | Admit: 2017-07-24 | Discharge: 2017-07-24 | Disposition: A | Discharge status: Home / Self Care | Payer: Medicare Other | Source: Ambulatory Visit | Attending: Radiation Oncology | Admitting: Radiation Oncology

## 2017-07-24 ENCOUNTER — Ambulatory Visit: Payer: Medicare Other | Admitting: Cardiology

## 2017-07-24 DIAGNOSIS — Z510 Encounter for antineoplastic radiation therapy: Secondary | ICD-10-CM | POA: Diagnosis not present

## 2017-07-24 DIAGNOSIS — C770 Secondary and unspecified malignant neoplasm of lymph nodes of head, face and neck: Secondary | ICD-10-CM | POA: Diagnosis not present

## 2017-07-24 NOTE — Progress Notes (Signed)
Oncology Nurse Navigator Documentation  To provide support, encouragement and care continuity, met with Mario Berg following RT. He stated progress with tmts wo/ issues, denied needs/concerns. I encouraged him to contact me as needed.  Gayleen Orem, RN, BSN Head & Neck Oncology Nurse [place] at [place] [phone]

## 2017-07-25 ENCOUNTER — Ambulatory Visit
Admission: RE | Admit: 2017-07-25 | Discharge: 2017-07-25 | Disposition: A | Discharge status: Home / Self Care | Payer: Medicare Other | Source: Ambulatory Visit | Attending: Radiation Oncology | Admitting: Radiation Oncology

## 2017-07-25 DIAGNOSIS — Z510 Encounter for antineoplastic radiation therapy: Secondary | ICD-10-CM | POA: Diagnosis not present

## 2017-07-25 DIAGNOSIS — C770 Secondary and unspecified malignant neoplasm of lymph nodes of head, face and neck: Secondary | ICD-10-CM | POA: Diagnosis not present

## 2017-07-26 ENCOUNTER — Ambulatory Visit
Admission: RE | Admit: 2017-07-26 | Discharge: 2017-07-26 | Disposition: A | Discharge status: Home / Self Care | Payer: Medicare Other | Source: Ambulatory Visit | Attending: Radiation Oncology | Admitting: Radiation Oncology

## 2017-07-26 DIAGNOSIS — Z510 Encounter for antineoplastic radiation therapy: Secondary | ICD-10-CM | POA: Diagnosis not present

## 2017-07-26 DIAGNOSIS — C770 Secondary and unspecified malignant neoplasm of lymph nodes of head, face and neck: Secondary | ICD-10-CM | POA: Diagnosis not present

## 2017-07-30 ENCOUNTER — Other Ambulatory Visit: Payer: Self-pay | Admitting: Radiation Oncology

## 2017-07-30 ENCOUNTER — Ambulatory Visit
Admission: RE | Admit: 2017-07-30 | Discharge: 2017-07-30 | Disposition: A | Discharge status: Home / Self Care | Payer: Medicare Other | Source: Ambulatory Visit | Attending: Radiation Oncology | Admitting: Radiation Oncology

## 2017-07-30 ENCOUNTER — Inpatient Hospital Stay: Payer: Medicare Other | Attending: Radiation Oncology

## 2017-07-30 DIAGNOSIS — C44310 Basal cell carcinoma of skin of unspecified parts of face: Secondary | ICD-10-CM

## 2017-07-30 DIAGNOSIS — C770 Secondary and unspecified malignant neoplasm of lymph nodes of head, face and neck: Secondary | ICD-10-CM

## 2017-07-30 DIAGNOSIS — Z510 Encounter for antineoplastic radiation therapy: Secondary | ICD-10-CM | POA: Diagnosis not present

## 2017-07-30 MED ORDER — LIDOCAINE VISCOUS HCL 2 % MT SOLN: OROMUCOSAL | 5 refills | Status: AC

## 2017-07-30 NOTE — Progress Notes (Signed)
Nutrition  Patient was a no show for nutrition appointment today.    Nakiyah Beverley B. Toula Miyasaki, RD, LDN Registered Dietitian [phone] (pager)   

## 2017-07-31 ENCOUNTER — Ambulatory Visit
Admission: RE | Admit: 2017-07-31 | Discharge: 2017-07-31 | Disposition: A | Discharge status: Home / Self Care | Payer: Medicare Other | Source: Ambulatory Visit | Attending: Radiation Oncology | Admitting: Radiation Oncology

## 2017-07-31 DIAGNOSIS — C770 Secondary and unspecified malignant neoplasm of lymph nodes of head, face and neck: Secondary | ICD-10-CM | POA: Diagnosis not present

## 2017-07-31 DIAGNOSIS — Z510 Encounter for antineoplastic radiation therapy: Secondary | ICD-10-CM | POA: Diagnosis not present

## 2017-07-31 NOTE — Progress Notes (Signed)
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  Noted surgery was planned but during pre-op cardiology workup determined patient needed stenting due to severe CAD but patient left AMA before this could be done.  Patient currently undergoing radiation therapy.    Patient came to clinic this afternoon to be seen by RD.  Patient missed appointment yesterday afternoon. Patient reports that he drinks 2 beer every night due to it's soothing effect on throat and dry mouth.  Reports that he eats chocolate or banana nut muffin every morning with 2 pieces of fruit (orange or canteloupe). For lunch drinks ensure or equate shake (not sure calorie amount).  Around 6:30-7pm snacks on potato chips or other type of chips and dip.  Around 8:30-9 usually has frozen dinner.  Reports that ensure causing upset stomach sometimes.    Reports that it feels like he has a candy wrapper in his throat.  Also reports that he has dry mouth.    Nutrition Focused Physical Exam: deferred  Medications: reviewed  Labs: reviewed  Anthropometrics:   Height: 68 inches Weight: 163 lb 3.2 oz UBW: 178 lb 10 years ago.  Noted weight of 169 lb Mar 26, 2017 BMI: 24  4% weight loss in 4 months   Estimated Energy Needs  Kcals: 2200-2500 calories/d Protein: 110-125 g/d Fluid: 2.5 L/d  NUTRITION DIAGNOSIS: Unintentional weight loss related to treatment side effects as evidenced by 4% weight loss in 4 months   INTERVENTION:  Reviewed 24 hour recall and offered suggestions on ways patient can add additional calories and protein into current eating pattern.  Patient not receptive to RD's suggestions on ways to improved nutrition.   Discussed strategies to help with dry mouth and fact sheet given to patient. Not willing to accept suggestions.   Discussed soft moist protein foods and list of foods  given to patient.  Discussed different oral nutrition supplements and encouraged patient to utilize shakes for extra nutrition.  Reports that they are to hard to open.  Offered carnation instant breakfast powder (sample) to mix with milk and reports milk will spoil before he uses it.   Samples given of ensure enlive and boost plus products. RD did not endorse/encourage use of alcohol to help sooth throat and dry mouth. Contact information given.      MONITORING, EVALUATION, GOAL: weight trends, intake   NEXT VISIT: as needed. Patient to contact RD with questions.  No follow-up planned as patient verbalized that he has a very set schedule and not willing to change.  RD would be happy to see patient if patient wanting suggestions for nutrition.   Mario Berg, [place], [place] Registered Dietitian [phone] (pager)

## 2017-08-01 ENCOUNTER — Ambulatory Visit
Admission: RE | Admit: 2017-08-01 | Discharge: 2017-08-01 | Disposition: A | Discharge status: Home / Self Care | Payer: Medicare Other | Source: Ambulatory Visit | Attending: Radiation Oncology | Admitting: Radiation Oncology

## 2017-08-01 DIAGNOSIS — C770 Secondary and unspecified malignant neoplasm of lymph nodes of head, face and neck: Secondary | ICD-10-CM | POA: Diagnosis not present

## 2017-08-01 DIAGNOSIS — Z510 Encounter for antineoplastic radiation therapy: Secondary | ICD-10-CM | POA: Diagnosis not present

## 2017-08-02 ENCOUNTER — Ambulatory Visit
Admission: RE | Admit: 2017-08-02 | Discharge: 2017-08-02 | Disposition: A | Discharge status: Home / Self Care | Payer: Medicare Other | Source: Ambulatory Visit | Attending: Radiation Oncology | Admitting: Radiation Oncology

## 2017-08-02 DIAGNOSIS — Z510 Encounter for antineoplastic radiation therapy: Secondary | ICD-10-CM | POA: Diagnosis not present

## 2017-08-02 DIAGNOSIS — C770 Secondary and unspecified malignant neoplasm of lymph nodes of head, face and neck: Secondary | ICD-10-CM | POA: Diagnosis not present

## 2017-08-05 ENCOUNTER — Ambulatory Visit
Admission: RE | Admit: 2017-08-05 | Discharge: 2017-08-05 | Disposition: A | Discharge status: Home / Self Care | Payer: Medicare Other | Source: Ambulatory Visit | Attending: Radiation Oncology | Admitting: Radiation Oncology

## 2017-08-05 DIAGNOSIS — C770 Secondary and unspecified malignant neoplasm of lymph nodes of head, face and neck: Secondary | ICD-10-CM | POA: Insufficient documentation

## 2017-08-05 DIAGNOSIS — Z510 Encounter for antineoplastic radiation therapy: Secondary | ICD-10-CM | POA: Insufficient documentation

## 2017-08-06 ENCOUNTER — Ambulatory Visit
Admission: RE | Admit: 2017-08-06 | Discharge: 2017-08-06 | Disposition: A | Discharge status: Home / Self Care | Payer: Medicare Other | Source: Ambulatory Visit | Attending: Radiation Oncology | Admitting: Radiation Oncology

## 2017-08-06 DIAGNOSIS — C770 Secondary and unspecified malignant neoplasm of lymph nodes of head, face and neck: Secondary | ICD-10-CM | POA: Diagnosis not present

## 2017-08-06 DIAGNOSIS — Z510 Encounter for antineoplastic radiation therapy: Secondary | ICD-10-CM | POA: Diagnosis not present

## 2017-08-07 ENCOUNTER — Ambulatory Visit
Admission: RE | Admit: 2017-08-07 | Discharge: 2017-08-07 | Disposition: A | Discharge status: Home / Self Care | Payer: Medicare Other | Source: Ambulatory Visit | Attending: Radiation Oncology | Admitting: Radiation Oncology

## 2017-08-07 DIAGNOSIS — Z510 Encounter for antineoplastic radiation therapy: Secondary | ICD-10-CM | POA: Diagnosis not present

## 2017-08-07 DIAGNOSIS — C770 Secondary and unspecified malignant neoplasm of lymph nodes of head, face and neck: Secondary | ICD-10-CM | POA: Diagnosis not present

## 2017-08-08 ENCOUNTER — Ambulatory Visit
Admission: RE | Admit: 2017-08-08 | Discharge: 2017-08-08 | Disposition: A | Discharge status: Home / Self Care | Payer: Medicare Other | Source: Ambulatory Visit | Attending: Radiation Oncology | Admitting: Radiation Oncology

## 2017-08-08 DIAGNOSIS — Z510 Encounter for antineoplastic radiation therapy: Secondary | ICD-10-CM | POA: Diagnosis not present

## 2017-08-08 DIAGNOSIS — C770 Secondary and unspecified malignant neoplasm of lymph nodes of head, face and neck: Secondary | ICD-10-CM | POA: Diagnosis not present

## 2017-08-09 ENCOUNTER — Ambulatory Visit
Admission: RE | Admit: 2017-08-09 | Discharge: 2017-08-09 | Disposition: A | Discharge status: Home / Self Care | Payer: Medicare Other | Source: Ambulatory Visit | Attending: Radiation Oncology | Admitting: Radiation Oncology

## 2017-08-09 DIAGNOSIS — C770 Secondary and unspecified malignant neoplasm of lymph nodes of head, face and neck: Secondary | ICD-10-CM | POA: Diagnosis not present

## 2017-08-09 DIAGNOSIS — Z510 Encounter for antineoplastic radiation therapy: Secondary | ICD-10-CM | POA: Diagnosis not present

## 2017-08-12 ENCOUNTER — Ambulatory Visit
Admission: RE | Admit: 2017-08-12 | Discharge: 2017-08-12 | Disposition: A | Discharge status: Home / Self Care | Payer: Medicare Other | Source: Ambulatory Visit | Attending: Radiation Oncology | Admitting: Radiation Oncology

## 2017-08-12 ENCOUNTER — Telehealth: Payer: Self-pay | Admitting: *Deleted

## 2017-08-12 DIAGNOSIS — Z510 Encounter for antineoplastic radiation therapy: Secondary | ICD-10-CM | POA: Diagnosis not present

## 2017-08-12 DIAGNOSIS — C770 Secondary and unspecified malignant neoplasm of lymph nodes of head, face and neck: Secondary | ICD-10-CM | POA: Diagnosis not present

## 2017-08-12 NOTE — Telephone Encounter (Signed)
[facility] to check on this referral, faxed additional notes to this facility and I am now waiting for a call with the appt. date and time.

## 2017-08-13 ENCOUNTER — Encounter: Payer: Self-pay | Admitting: Radiation Oncology

## 2017-08-13 ENCOUNTER — Encounter: Payer: Self-pay | Admitting: *Deleted

## 2017-08-13 ENCOUNTER — Ambulatory Visit
Admission: RE | Admit: 2017-08-13 | Discharge: 2017-08-13 | Disposition: A | Discharge status: Home / Self Care | Payer: Medicare Other | Source: Ambulatory Visit | Attending: Radiation Oncology | Admitting: Radiation Oncology

## 2017-08-13 DIAGNOSIS — Z510 Encounter for antineoplastic radiation therapy: Secondary | ICD-10-CM | POA: Diagnosis not present

## 2017-08-13 DIAGNOSIS — C770 Secondary and unspecified malignant neoplasm of lymph nodes of head, face and neck: Secondary | ICD-10-CM

## 2017-08-13 MED ORDER — RADIAPLEXRX EX GEL
Freq: Once | CUTANEOUS | Status: AC
  Administered 2017-08-13: 15:00:00 via TOPICAL

## 2017-08-15 NOTE — Progress Notes (Signed)
Oncology Nurse Navigator Documentation  Met with Mr Nolde during final RT to offer support and to celebrate end of radiation treatment.   Provided verbal/written post-RT guidance:  Importance of keeping all follow-up appts, especially those with Nutrition and SLP.  Importance of protecting treatment area from sun.  Continuation of Radioplex application 2-3 times daily until supply exhausted after which transition to OTC lotion with vitamin E. Explained my role as navigator will continue for several more months and that I will be calling and/or joining him during follow-up visits.   I encouraged him to call me with needs/concerns.   He verbalized understanding of information provided.  Rick Diehl, RN, BSN, CHPN Head & Neck Oncology Navigator [facility] at [place] [phone]  

## 2017-08-15 NOTE — Progress Notes (Signed)
Mr Rockers presents for follow up of radiation completed 08/13/17 to his left neck.   Pain issues, if any: He denies pain Using a feeding tube?: N/A Weight changes, if any:  07/30/17 163.2 lb 08/05/17 164.0 lb 08/12/17 160.8 lb 08/27/17 159.2 lb Swallowing issues, if any: He reports a dry throat and mouth. He has difficulty swallowing because of this. He is eating softer foods and using gingerale to help swallow food easier.  Smoking or chewing tobacco? No.  Using fluoride trays daily?  Last ENT visit was on: Not since diagnosis.  Other notable issues, if any:  The skin to his left face/neck is red with peeling skin. He was advised to use neosporin and sonafine twice daily to the peeling and red areas. He has only used sonafine to his radiation site.   BP (!) 159/79 (BP Location: Right Arm, Patient Position: Sitting, Cuff Size: Normal)   Pulse 67   Temp 97.6 F (36.4 C) (Oral)   Resp 20   Wt 159 lb 3.2 oz (72.2 kg)   SpO2 99%   BMI 24.21 kg/m

## 2017-08-16 ENCOUNTER — Telehealth: Payer: Self-pay | Admitting: *Deleted

## 2017-08-16 NOTE — Telephone Encounter (Signed)
CALLED PATIENT TO INFORM THAT [facility] IS TRYING TO REACH HIM TO ARRANGE AN APPT., LVM FOR A RETURN CALL

## 2017-08-16 NOTE — Progress Notes (Signed)
  Radiation Oncology         [phone] ________________________________  Name: Mario Berg MRN: [id number]  Date: 08/13/2017  DOB: [birth year]  End of Treatment Note  Diagnosis:   Skin cancer metastatic to neck  Cancer Staging Secondary and unspecified malignant neoplasm of lymph nodes of head, face and neck ([place]) Staging form: Cutaneous Carcinoma of the Head and Neck, AJCC 8th Edition - Clinical: Stage Unknown (cTX, cN2a, cM0) - Signed by Eppie Gibson, MD on 07/07/2017     Indication for treatment:  Curative       Radiation treatment dates:   07/16/17 - 08/13/17  Site/dose:   Total dose of 53 Gy directed to the Left Neck delivered in 20 fractions (lower doses to empiric areas without gross disease).  Beams/energy:   IMRT// photon// 6X  Narrative: The patient tolerated radiation treatment relatively well.  He reports having fatigue, decreased appetite and taste changes. He is drinking ensure daily. He had an episode of throat pain and started to use lidocaine. His throat was dry and scratchy but has improved. He does have thick mucous present and reports that ginger ale has helped with this. The skin to his neck is red and dry. He is using sonafine to this area as directed.  Plan: The patient has completed radiation treatment. The patient will return to radiation oncology clinic for routine followup in one month. I advised them to call or return sooner if they have any questions or concerns related to their recovery or treatment.  -----------------------------------  Eppie Gibson, MD  This document serves as a record of services personally performed by Eppie Gibson MD. It was created on her behalf by Delton Coombes, a trained medical scribe. The creation of this record is based on the scribe's personal observations and the provider's statements to them.

## 2017-08-22 ENCOUNTER — Telehealth: Payer: Self-pay | Admitting: *Deleted

## 2017-08-22 NOTE — Telephone Encounter (Signed)
Oncology Nurse Navigator Documentation  Returned call to Mario Berg.  He expressed concern about dry sloughing skin and some areas of wet desquamation in areas of recent RT (completed 6/18).  I assured him this is an expected treatment SE, encouraged him to:  Gently wash areas with warm water, pat with wet washcloth.  Continue applying Radioplex to areas of unbroken skin, abx ointment to broken skin. He voiced understanding of 6/25 2:20 follow-up appt with Dr. Isidore Moos during which he will received further guidance as needed.  Gayleen Orem, RN, BSN Head & Neck Oncology Nurse [place] at [place] [phone]

## 2017-08-26 ENCOUNTER — Encounter: Payer: Self-pay | Admitting: Radiation Oncology

## 2017-08-26 ENCOUNTER — Telehealth: Payer: Self-pay | Admitting: *Deleted

## 2017-08-26 NOTE — Telephone Encounter (Signed)
Oncology Nurse Navigator Documentation  Patient called to confirm tomorrow's appt time and registration/arrival procedures.  Gayleen Orem, RN, BSN Head & Neck Oncology Nurse [place] at [place] [phone]

## 2017-08-27 ENCOUNTER — Other Ambulatory Visit: Payer: Self-pay

## 2017-08-27 ENCOUNTER — Encounter: Payer: Self-pay | Admitting: Radiation Oncology

## 2017-08-27 ENCOUNTER — Ambulatory Visit
Admission: RE | Admit: 2017-08-27 | Discharge: 2017-08-27 | Disposition: A | Discharge status: Home / Self Care | Payer: Medicare Other | Source: Ambulatory Visit | Attending: Radiation Oncology | Admitting: Radiation Oncology

## 2017-08-27 VITALS — BP 159/79 | HR 67 | Temp 97.6°F | Resp 20 | Wt 159.2 lb

## 2017-08-27 DIAGNOSIS — Z08 Encounter for follow-up examination after completed treatment for malignant neoplasm: Secondary | ICD-10-CM | POA: Diagnosis not present

## 2017-08-27 DIAGNOSIS — C770 Secondary and unspecified malignant neoplasm of lymph nodes of head, face and neck: Secondary | ICD-10-CM | POA: Insufficient documentation

## 2017-08-27 DIAGNOSIS — C44310 Basal cell carcinoma of skin of unspecified parts of face: Secondary | ICD-10-CM | POA: Diagnosis not present

## 2017-08-27 DIAGNOSIS — Z923 Personal history of irradiation: Secondary | ICD-10-CM | POA: Diagnosis not present

## 2017-08-27 DIAGNOSIS — Z888 Allergy status to other drugs, medicaments and biological substances status: Secondary | ICD-10-CM | POA: Diagnosis not present

## 2017-08-27 DIAGNOSIS — Z7982 Long term (current) use of aspirin: Secondary | ICD-10-CM | POA: Insufficient documentation

## 2017-08-27 DIAGNOSIS — Z79899 Other long term (current) drug therapy: Secondary | ICD-10-CM | POA: Insufficient documentation

## 2017-08-27 DIAGNOSIS — Z882 Allergy status to sulfonamides status: Secondary | ICD-10-CM | POA: Diagnosis not present

## 2017-08-27 DIAGNOSIS — Z880 Allergy status to penicillin: Secondary | ICD-10-CM | POA: Insufficient documentation

## 2017-08-27 HISTORY — DX: Personal history of irradiation: Z92.3

## 2017-08-27 NOTE — Progress Notes (Signed)
Radiation Oncology         [phone] ________________________________  Name: Mario Berg MRN: [id number]  Date: 08/27/2017  DOB: April 27, 1940  Follow-Up Visit Note  CC: [name], [name], MD  Rozetta Nunnery, *  Diagnosis and Prior Radiotherapy:       ICD-10-CM   1. Basal cell carcinoma, face C44.310   2. 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  He was concerned about the color of skin at the end of treatment, but after talking to [facility] he was reassured that the erythema was normal. He has not gone to a Dermatologist for the mass to the right side of his nose - cancelled the consult despite my urging that he be seen ASAP.   ALLERGIES:  is allergic to penicillins; atorvastatin; furosemide; bystolic [nebivolol hcl]; clams [shellfish allergy]; clonidine derivatives; gemfibrozil; metoprolol tartrate; sulfonamide derivatives; tekturna [aliskiren fumarate]; terazosin hcl; and verapamil.  Meds: Current Outpatient Medications  Medication Sig Dispense Refill  . acetaminophen (TYLENOL) 325 MG tablet Take 2 tablets (650 mg total) by mouth every 6 (six) hours as needed for mild pain or headache.    Marland Kitchen aspirin EC 81 MG tablet Take 1 tablet (81 mg total) by mouth daily. 100 tablet 3  . Cholecalciferol (VITAMIN D3) 1000 units CAPS Take by mouth.    . Coenzyme Q10 (CO Q 10) 100 MG CAPS Take 100 mg by mouth daily.    . indomethacin (INDOCIN) 25 MG capsule Take 25 mg by mouth 3 (three) times daily as needed.    . isosorbide mononitrate (IMDUR) 30 MG 24 hr tablet Take 1 tablet (30 mg total) by mouth daily. 90 tablet 3  . lisinopril (PRINIVIL,ZESTRIL) 40 MG tablet Take 1 tablet (40 mg total) by mouth 2 (two) times daily. 60 tablet 11  . Multiple Vitamin (MULTIVITAMIN) tablet Take 1 tablet by  mouth daily.     . nitroGLYCERIN (NITROSTAT) 0.4 MG SL tablet PLACE 1 TABLET UNDER THE TONGUE EVERY 5 MINUTES AS NEEDED FOR CHEST PAIN 20 tablet 2  . Omega-3 Fatty Acids (FISH OIL) 1200 MG CAPS Take 1,200 mg by mouth daily.     Marland Kitchen Ophthalmic Irrigation Solution (EYE WASH) 99.05 % SOLN Apply 1 application to eye daily.    Marland Kitchen oxymetazoline (AFRIN) 0.05 % nasal spray Place 1 spray into both nostrils 2 (two) times daily as needed for congestion.    . lidocaine (XYLOCAINE) 2 % solution Patient: Mix 1part 2% viscous lidocaine, 1part H20. Swish &swallow 5mL of diluted mixture, 43min before meals and at bedtime, up to QID (Patient not taking: Reported on 08/27/2017) 100 mL 5  . rosuvastatin (CRESTOR) 20 MG tablet Take 1 tablet (20 mg total) by mouth daily at 6 PM. (Patient not taking: Reported on 07/05/2017) 90 tablet 3   No current facility-administered medications for this encounter.     Physical Findings: The patient is in no acute distress. Patient is alert and oriented. Wt Readings from Last 3 Encounters:  08/27/17 159 lb 3.2 oz (72.2 kg)  07/05/17 163 lb 3.2 oz (74 kg)  06/28/17 172 lb 3.2 oz (78.1 kg)    weight is 159 lb 3.2 oz (72.2 kg). His oral temperature is 97.6 F (36.4 C). His blood pressure is 159/79 (abnormal) and his pulse is  67. His respiration is 20 and oxygen saturation is 99%. .  General: Alert and oriented, in no acute distress HEENT: Head is normocephalic. Extraocular movements are intact. mucous membranes are slightly dry Neck: very subtle flattened mass in the left cervical neck, which is really hard to appreciate. demonstrating a great response to treatment.  Skin: Skin in treatment fields shows satisfactory healing, erythematous in the radiation field,  2 cm scab on left neck [place], raised mass remaining over the right lateral nose at the supra alar crease.  Lymphatics: see Neck Exam   Lab Findings: Lab Results  Component Value Date   WBC 7.7 06/28/2017   HGB 12.6 (L)  06/28/2017   HCT 38.0 (L) 06/28/2017   MCV 93.1 06/28/2017   PLT 185 06/28/2017    Lab Results  Component Value Date   TSH 1.83 03/26/2017    Radiographic Findings: No results found.  Impression/Plan:    1) Head and Neck Cancer Status: healing from radiotherapy  2) Nutritional Status: slight weight loss, patient was provided with written information on maximizing his nutrition PEG tube: N/A  3) Risk Factors: use good sun hygiene  4) Swallowing: functional, and taking all nutrition by mouth  5) Thyroid function:  Lab Results  Component Value Date   TSH 1.83 03/26/2017    6) Other:Today I rec'd RT to the nose for progressive lesion (presume BCC) since pt did not see dermatology for surgical option.  He changed his mind, wants to see derm. Will make a referral to [facility] for likely skin cancer on his nose since he missed the original consultation.    7) Follow-up in 3 months. He would like to find out how much the PET will cost. If the cost is reasonable, he will undergo a PET at least 24 hours prior to his follow-up. The patient was encouraged to call with any issues or questions before then.  Patient was given an informational document that our care team curated for head and neck cancer survivors to help with symptom management,and overall post-treatment wellness. Phone number of Gayleen Orem, RN, our Head and Neck Oncology Navigator was provided in case any questions arise.   I spent 25 minutes face to face with the patient and more than 50% of that time was spent in counseling and/or coordination of care.  _____________________________________   Eppie Gibson, MD  This document serves as a record of services personally performed by Eppie Gibson, MD. It was created on her behalf by Margit Banda, a trained medical scribe. The creation of this record is based on the scribe's personal observations and the provider's statements to them. This  document has been checked and approved by the attending provider.

## 2017-08-29 ENCOUNTER — Encounter: Payer: Self-pay | Admitting: *Deleted

## 2017-08-29 NOTE — Progress Notes (Signed)
Oncology Nurse Navigator Documentation  Met with patient during scheduled follow-up appt with Dr. Isidore Moos to provide support and maintain care continuity.  He completed RT 08/13/17 for L neck malignancy.  He reported improved skin condition in area of tmt.  He refused Dr. Pearlie Oyster suggestion of RT for R lateral nostril basal cell mass, indicated he wd prefer surgery.  He indicated he wd contact [facility] "in a couple of weeks".  In context of pending re-staging PET to be scheduled in mid-November he stated he wished to know estimate of his out-of-pocket cost before agreeing to scan.  I indicated I relay his request to [facility]. I encouraged him to contact me with needs/concerns.  Gayleen Orem, RN, BSN Head & Neck Oncology Nurse [place] at [place] [phone]

## 2017-08-30 ENCOUNTER — Other Ambulatory Visit: Payer: Self-pay | Admitting: Radiation Oncology

## 2017-08-30 ENCOUNTER — Encounter: Payer: Self-pay | Admitting: Radiation Oncology

## 2017-08-30 DIAGNOSIS — C770 Secondary and unspecified malignant neoplasm of lymph nodes of head, face and neck: Secondary | ICD-10-CM

## 2017-09-03 ENCOUNTER — Telehealth: Payer: Self-pay | Admitting: *Deleted

## 2017-09-03 DIAGNOSIS — H6123 Impacted cerumen, bilateral: Secondary | ICD-10-CM | POA: Diagnosis not present

## 2017-09-03 DIAGNOSIS — H60392 Other infective otitis externa, left ear: Secondary | ICD-10-CM | POA: Diagnosis not present

## 2017-09-03 DIAGNOSIS — H903 Sensorineural hearing loss, bilateral: Secondary | ICD-10-CM | POA: Diagnosis not present

## 2017-09-03 DIAGNOSIS — L589 Radiodermatitis, unspecified: Secondary | ICD-10-CM | POA: Diagnosis not present

## 2017-09-03 NOTE — Telephone Encounter (Signed)
Oncology Nurse Navigator Documentation  Mr. Haste called to report:  Saw ENT Dr. Thornell Mule today for ear cleaning.  Was determined he had L ear infection for which he rec'd abx ear drops.  Has appt 7/22 with [facility] to assess R nostril lesion. I informed him RadOnc Financial Counseling advises he call [facility] to determine his out-of-pocket cost for PET to be scheduled late September.  He indicated he has phone # on recent bill.  Gayleen Orem, RN, BSN Head & Neck Oncology Nurse [place] at [place] [phone]

## 2017-09-10 DIAGNOSIS — L589 Radiodermatitis, unspecified: Secondary | ICD-10-CM | POA: Diagnosis not present

## 2017-09-10 DIAGNOSIS — H903 Sensorineural hearing loss, bilateral: Secondary | ICD-10-CM | POA: Diagnosis not present

## 2017-09-23 DIAGNOSIS — D485 Neoplasm of uncertain behavior of skin: Secondary | ICD-10-CM | POA: Diagnosis not present

## 2017-09-23 DIAGNOSIS — C44311 Basal cell carcinoma of skin of nose: Secondary | ICD-10-CM | POA: Diagnosis not present

## 2017-10-28 ENCOUNTER — Encounter: Payer: Self-pay | Admitting: Cardiovascular Disease

## 2017-10-28 ENCOUNTER — Ambulatory Visit (INDEPENDENT_AMBULATORY_CARE_PROVIDER_SITE_OTHER): Payer: Medicare Other | Admitting: Cardiovascular Disease

## 2017-10-28 VITALS — BP 170/88 | HR 82 | Ht 68.0 in | Wt 154.8 lb

## 2017-10-28 DIAGNOSIS — I25119 Atherosclerotic heart disease of native coronary artery with unspecified angina pectoris: Secondary | ICD-10-CM

## 2017-10-28 DIAGNOSIS — I208 Other forms of angina pectoris: Secondary | ICD-10-CM | POA: Diagnosis not present

## 2017-10-28 NOTE — Patient Instructions (Signed)
Medication Instructions:  Your provider recommends that you continue on your current medications as directed. Please refer to the Current Medication list given to you today.    Labwork: None  Testing/Procedures: None  Follow-Up: Please call us when you are ready to schedule your heart catheterization.   Any Other Special Instructions Will Be Listed Below (If Applicable).     If you need a refill on your cardiac medications before your next appointment, please call your pharmacy.

## 2017-10-28 NOTE — Progress Notes (Signed)
Cardiology Office Note Date:  10/28/2017   ID:  CLEVELAND YARBRO, DOB 20-Apr-1940, MRN [id number]  PCP:  Cassandria Anger, MD  Cardiologist:  Sherren Mocha, MD    Chief Complaint  Patient presents with  . Coronary Artery Disease     History of Present Illness: ANTONE SUMMONS is a 77 y.o. male who presents for follow-up of coronary artery disease and chronic angina.   The patient initially presented in 2017 with symptoms typical of unstable angina.  At that time he declined invasive evaluation.  He was treated with isosorbide and aspirin.  He has had intolerances to a multitude of medicines and has declined taking statin drugs.  He was evaluated in April 2019 preoperatively for planned neck dissection for treatment of cancer.  Cardiac catheterization was performed and demonstrated two-vessel coronary artery disease with total occlusion of the RCA collateralized from the left coronary artery, and severe calcific stenosis of the proximal LAD.  After review of treatment options, plans were made to treat him with atherectomy and PCI, but the patient left the hospital and declined intervention.  He had a very hard time in the hospital because of frequent blood pressure checks, multiple interruptions overnight, and difficulty urinating.  He is still upset about his experience at the hospital and is not eager to go back for any procedure at this time.  The patient has not had any recent chest discomfort or shortness of breath.  He takes his medications on most days.  He has no symptoms at his current activity level.  He has undergone radiation treatment for his neck cancer as surgery was not performed because of his severe CAD.  Past Medical History:  Diagnosis Date  . Adenomatous polyp 04/2007  . Basal cell carcinoma of nose   . BPH (benign prostatic hyperplasia)   . DIVERTICULITIS OF COLON 05/06/2007  . Diverticulitis, colon   . Diverticulosis of colon   . Gouty arthritis    "since the late  1990s"  . Hemorrhoids   . History of radiation therapy 07/16/17- 08/13/17   53 Gy directed to the left neck delivered in 20 fractions.   . Hyperlipidemia   . Hypertension   . IBS (irritable bowel syndrome)    Dr. Fuller Plan  C/D  . LBP (low back pain)   . 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 Nasal congestion   . OA (osteoarthritis)   . Rectal bleeding   . Shortness of breath    [place]  . Squamous cell cancer of skin of left cheek   . Tinnitus of both ears   . Umbilical hernia    NO PAIN-BUT PT WORRIES ABOUT IT--WEARS A BACK BRACE FOR SUPPORT AT TIMES    Past Surgical History:  Procedure Laterality Date  . CARDIAC CATHETERIZATION  06/27/2017  . HEMORRHOID SURGERY  05/17/2011   Procedure: HEMORRHOIDECTOMY PROLAPSED;  Surgeon: Adin Hector, MD;  Location: [facility];  Service: General;  Laterality: N/A;  [place] [facility] [place]   . LEFT HEART CATH AND CORONARY ANGIOGRAPHY N/A 06/27/2017   Procedure: LEFT HEART CATH AND CORONARY ANGIOGRAPHY;  Surgeon: Burnell Blanks, MD;  Location: [facility];  Service: Cardiovascular;  Laterality: N/A;  . SQUAMOUS CELL CARCINOMA EXCISION  12-21-2006   Nose  . [facility]     05/17/2011    Current Outpatient Medications  Medication Sig Dispense Refill  . aspirin EC 81 MG tablet Take 1 tablet (81 mg  total) by mouth daily. 100 tablet 3  . Cholecalciferol (VITAMIN D3) 1000 units CAPS Take by mouth.    . Coenzyme Q10 (CO Q 10) 100 MG CAPS Take 100 mg by mouth daily.    . indomethacin (INDOCIN) 25 MG capsule Take 25 mg by mouth 3 (three) times daily as needed.    . isosorbide mononitrate (IMDUR) 30 MG 24 hr tablet Take 1 tablet (30 mg total) by mouth daily. 90 tablet 3  . lidocaine (XYLOCAINE) 2 % solution Patient: Mix 1part 2% viscous lidocaine, 1part H20. Swish &swallow 100mL of diluted mixture, 59min before meals and at bedtime, up to QID 100 mL 5  . lisinopril (PRINIVIL,ZESTRIL) 40 MG tablet Take 1 tablet (40 mg total) by  mouth 2 (two) times daily. 60 tablet 11  . Multiple Vitamin (MULTIVITAMIN) tablet Take 1 tablet by mouth daily.     . nitroGLYCERIN (NITROSTAT) 0.4 MG SL tablet PLACE 1 TABLET UNDER THE TONGUE EVERY 5 MINUTES AS NEEDED FOR CHEST PAIN 20 tablet 2  . Omega-3 Fatty Acids (FISH OIL) 1200 MG CAPS Take 1,200 mg by mouth daily.     Marland Kitchen Ophthalmic Irrigation Solution (EYE WASH) 99.05 % SOLN Apply 1 application to eye daily.    Marland Kitchen oxymetazoline (AFRIN) 0.05 % nasal spray Place 1 spray into both nostrils 2 (two) times daily as needed for congestion.     No current facility-administered medications for this visit.     Allergies:   Penicillins; Atorvastatin; Furosemide; Bystolic [nebivolol hcl]; Clams [shellfish allergy]; Clonidine derivatives; Gemfibrozil; Metoprolol tartrate; Sulfonamide derivatives; Tekturna [aliskiren fumarate]; Terazosin hcl; and Verapamil   Social History:  The patient  reports that he quit smoking about 29 years ago. His smoking use included cigarettes. He has a 44.00 pack-year smoking history. He has never used smokeless tobacco. He reports that he drinks about 20.0 standard drinks of alcohol per week. He reports that he has current or past drug history. Drug: Marijuana.   Family History:  The patient's  family history includes Heart failure in his father and mother; Hypertension in his father and mother.    ROS:  Please see the history of present illness.  Otherwise, review of systems is positive for weight loss, cough, shortness of breath, excessive fatigue, constipation, anxiety, balance problems, joint swelling.  All other systems are reviewed and negative.    PHYSICAL EXAM: VS:  BP (!) 170/88   Pulse 82   Ht 5\' 8"  (1.727 m)   Wt 154 lb 12.8 oz (70.2 kg)   SpO2 97%   BMI 23.54 kg/m  , BMI Body mass index is 23.54 kg/m. GEN: Well nourished, well developed, in no acute distress  HEENT: normal  Neck: no JVD, no masses. No carotid bruits Cardiac: RRR without murmur or gallop                 Respiratory:  clear to auscultation bilaterally, normal work of breathing GI: soft, nontender, nondistended, + BS MS: no deformity or atrophy  Ext: no pretibial edema, pedal pulses 2+= bilaterally Skin: warm and dry, no rash Neuro:  Strength and sensation are intact Psych: euthymic mood, full affect  EKG:  EKG is not ordered today.  Recent Labs: 03/26/2017: ALT 10; TSH 1.83 06/28/2017: BUN 12; Creatinine, Ser 0.95; Hemoglobin 12.6; Platelets 185; Potassium 3.4; Sodium 141   Lipid Panel     Component Value Date/Time   CHOL 222 (H) 07/08/2015 0102   TRIG 157 (H) 07/08/2015 0102   HDL 35 (  L) 07/08/2015 0102   CHOLHDL 6.3 07/08/2015 0102   VLDL 31 07/08/2015 0102   LDLCALC 156 (H) 07/08/2015 0102   LDLDIRECT 151.9 01/14/2012 1301      Wt Readings from Last 3 Encounters:  10/28/17 154 lb 12.8 oz (70.2 kg)  08/27/17 159 lb 3.2 oz (72.2 kg)  07/05/17 163 lb 3.2 oz (74 kg)     Cardiac Studies Reviewed: Cardiac Cath 06-27-2017: Conclusion     Prox Cx lesion is 30% stenosed.  Dist Cx lesion is 30% stenosed.  Ost 3rd Mrg to 3rd Mrg lesion is 30% stenosed.  Prox LAD lesion is 95% stenosed.  Dist LAD lesion is 50% stenosed.  Mid LAD to Dist LAD lesion is 70% stenosed.  Prox RCA lesion is 100% stenosed.   1. Chronic occlusion of the ostial RCA. The proximal, mid and distal RCA fills from left to right collaterals 2. Severe, heavily calcified stenosis in the proximal LAD. There is diffuse moderate disease in the distal LAD 3. The Circumflex has mild diffuse disease  Recommendations: He has a pending surgery for his neck cancer. This was planned for next week. His LAD stenosis can be treated with orbital atherectomy and stenting. Since he will need surgery ASAP on his neck mass, I will place a DES and plan one month of dual anti-platelet therapy. Per new findings presented in recent cardiology literature, one month of DAPT with a DES is felt to be just as safe  as one month of DAPT with a bare metal stent. No bare metal stents are available in our hospital at this time. I will plan orbital atherectomy of the proximal LAD tomorrow with placement of a DES. We will then plan one month of ASA and Plavix prior to stopping for his neck surgery. I have discussed this with Dr. Lucia Gaskins his ENT surgeon. He will be loaded with Plavix tonight.    Indications   Unstable angina (HCC) [I20.0 (ICD-10-CM)]  Procedural Details/Technique   Technical Details Indication: 77 yo male with history of HTN,HLD, neck cancer and daily angina who is here today for risk stratification prior to planned neck surgery.   Procedure: The risks, benefits, complications, treatment options, and expected outcomes were discussed with the patient. The patient and/or family concurred with the proposed plan, giving informed consent. The patient was brought to the cath lab after IV hydration was given. The patient was further sedated with Versed and Fentanyl. The right wrist was prepped and draped in a sterile fashion. 1% lidocaine was used for local anesthesia. Using the modified Seldinger access technique, a 5 French sheath was placed in the right radial artery. 3 mg Verapamil was given through the sheath. 4000 units IV heparin was given. I engaged the left main with a XB LAD 3.5 guiding catheter. Selective angiography was performed. I was unable to engage the RCA. The RCA was felt to be flush occluded. The RCA filled back to the proximal vessel from left to right collaterals. LV pressures measured with the JR4 catheter. The sheath was removed from the right radial artery and a Terumo hemostasis band was applied at the arteriotomy site on the right wrist.     Estimated blood loss <50 mL.  During this procedure the patient was administered the following to achieve and maintain moderate conscious sedation: Versed 2 mg, Fentanyl 50 mcg, while the patient's heart rate, blood pressure, and oxygen  saturation were continuously monitored. The period of conscious sedation was 34 minutes, of which I was  present face-to-face 100% of this time.  Complications   Complications documented before study signed (06/27/2017 4:50 PM EDT)    LEFT HEART CATH AND CORONARY ANGIOGRAPHY   None Documented by Burnell Blanks, MD 06/27/2017 4:43 PM EDT  Time Range: Intraprocedure      Coronary Findings   Diagnostic  Dominance: Right  Left Anterior Descending  Vessel is large.  Prox LAD lesion 95% stenosed  Prox LAD lesion is 95% stenosed. The lesion is calcified.  Mid LAD to Dist LAD lesion 70% stenosed  Mid LAD to Dist LAD lesion is 70% stenosed.  Dist LAD lesion 50% stenosed  Dist LAD lesion is 50% stenosed.  First Diagonal Branch  Vessel is small in size.  Left Circumflex  Vessel is large.  Prox Cx lesion 30% stenosed  Prox Cx lesion is 30% stenosed.  Dist Cx lesion 30% stenosed  Dist Cx lesion is 30% stenosed.  First Obtuse Marginal Branch  Vessel is small in size.  Second Obtuse Marginal Branch  Vessel is large in size.  Third Obtuse Marginal Branch  Vessel is moderate in size.  Ost 3rd Mrg to 3rd Mrg lesion 30% stenosed  Ost 3rd Mrg to 3rd Mrg lesion is 30% stenosed.  Right Coronary Artery  Vessel is large.  Prox RCA lesion 100% stenosed  Prox RCA lesion is 100% stenosed. The lesion is chronically occluded.  Right Posterior Atrioventricular Branch  Collaterals  Post Atrio filled by collaterals from 3rd Sept.    Intervention   No interventions have been documented.  Coronary Diagrams   Diagnostic Diagram        ASSESSMENT AND PLAN: 1.  Coronary artery disease, native vessel, with angina.  CCS functional class II symptoms.  2.  Hypertension, uncontrolled  3.  Mixed hyperlipidemia  4.  Head and neck cancer (squamous cell)  The patient is counseled extensively regarding his coronary artery disease.  He does have severe proximal LAD stenosis supplying  collateral flow to the totally occluded RCA.  He is having minimal symptoms at present on isosorbide for antianginal therapy.  The patient is also on aspirin.  We discussed the pros and cons of PCI versus medical therapy.  I have recommended PCI because of high risk anatomy with severe proximal LAD stenosis.  I explained to the patient that this would involve continuation of aspirin and addition of clopidogrel for a period of 6 months.  Also advised him that I would likely plan on atherectomy because of heavy calcification and would attempt to treat him as a same-day PCI to avoid overnight hospitalization.  I reviewed the risks, indications, and alternatives of PCI at length with the patient today.  For now he would like to think things over.  He will continue his current medical program.  He does not want to add any medications at this time.  He will contact our office for follow-up if he desires to seek any further treatment.  Current medicines are reviewed with the patient today.  The patient does not have concerns regarding medicines.  Labs/ tests ordered today include:  No orders of the defined types were placed in this encounter.   Disposition:   FU prn  Signed, Sherren Mocha, MD  10/28/2017 3:45 PM    [facility] [address], [place], [place]  [address] Phone: [phone]; Fax: [phone]

## 2017-11-25 ENCOUNTER — Telehealth: Payer: Self-pay | Admitting: *Deleted

## 2017-11-25 NOTE — Telephone Encounter (Signed)
CALLED PATIENT TO INFORM THAT PET IS SCHEDULED FOR 12-03-17 @ [facility] AND HIS FU HAS BEEN RESCHEDULED FOR 12-04-17, LVM FOR A RETURN CALL

## 2017-11-27 NOTE — Progress Notes (Signed)
Mario Berg presents for follow up of radiation completed 08/13/17 to his left neck  Pain issues, if any: He denies pain.  Using a feeding tube?: N/A Weight changes, if any:  Wt Readings from Last 3 Encounters:  12/04/17 149 lb 6 oz (67.8 kg)  10/28/17 154 lb 12.8 oz (70.2 kg)  08/27/17 159 lb 3.2 oz (72.2 kg)   Swallowing issues, if any: He is having difficulty swallowing. He tells me that he gets choked at times on food.  Smoking or chewing tobacco? He is not smoking cigarettes. He does smoke marijuana almost daily (small amount) Using fluoride trays daily?  Last ENT visit was on: Dr. Lucia Gaskins last April 2019 Other notable issues, if any:  He has thick mucous to his throat which limits his swallowing. He has tried to use muccinex without good results. He is drinking gingerale to help with mucous control  PET 12/03/17  BP (!) 169/63 (BP Location: Left Arm, Patient Position: Sitting)   Pulse 61   Temp 98 F (36.7 C) (Oral)   Resp 18   Ht 5\' 8"  (1.727 m)   Wt 149 lb 6 oz (67.8 kg)   SpO2 100%   BMI 22.71 kg/m

## 2017-11-29 ENCOUNTER — Ambulatory Visit: Payer: Medicare Other | Admitting: Radiation Oncology

## 2017-12-03 ENCOUNTER — Ambulatory Visit (HOSPITAL_COMMUNITY)
Admission: RE | Admit: 2017-12-03 | Discharge: 2017-12-03 | Disposition: A | Discharge status: Home / Self Care | Payer: Medicare Other | Source: Ambulatory Visit | Attending: Radiation Oncology | Admitting: Radiation Oncology

## 2017-12-03 DIAGNOSIS — I700 Atherosclerosis of aorta: Secondary | ICD-10-CM | POA: Insufficient documentation

## 2017-12-03 DIAGNOSIS — M7989 Other specified soft tissue disorders: Secondary | ICD-10-CM | POA: Diagnosis not present

## 2017-12-03 DIAGNOSIS — C760 Malignant neoplasm of head, face and neck: Secondary | ICD-10-CM | POA: Diagnosis not present

## 2017-12-03 DIAGNOSIS — R221 Localized swelling, mass and lump, neck: Secondary | ICD-10-CM | POA: Diagnosis not present

## 2017-12-03 DIAGNOSIS — C770 Secondary and unspecified malignant neoplasm of lymph nodes of head, face and neck: Secondary | ICD-10-CM | POA: Insufficient documentation

## 2017-12-03 LAB — GLUCOSE, CAPILLARY: Glucose-Capillary: 112 mg/dL — ABNORMAL HIGH (ref 70–99)

## 2017-12-03 MED ORDER — FLUDEOXYGLUCOSE F - 18 (FDG) INJECTION
7.9000 | Freq: Once | INTRAVENOUS | Status: AC | PRN
  Administered 2017-12-03: 7.9 via INTRAVENOUS

## 2017-12-04 ENCOUNTER — Encounter: Payer: Self-pay | Admitting: Radiation Oncology

## 2017-12-04 ENCOUNTER — Encounter: Payer: Self-pay | Admitting: *Deleted

## 2017-12-04 ENCOUNTER — Other Ambulatory Visit: Payer: Self-pay

## 2017-12-04 ENCOUNTER — Ambulatory Visit
Admission: RE | Admit: 2017-12-04 | Discharge: 2017-12-04 | Disposition: A | Discharge status: Home / Self Care | Payer: Medicare Other | Source: Ambulatory Visit | Attending: Radiation Oncology | Admitting: Radiation Oncology

## 2017-12-04 VITALS — BP 169/63 | HR 61 | Temp 98.0°F | Resp 18 | Ht 68.0 in | Wt 149.4 lb

## 2017-12-04 DIAGNOSIS — R1310 Dysphagia, unspecified: Secondary | ICD-10-CM | POA: Diagnosis not present

## 2017-12-04 DIAGNOSIS — Z8579 Personal history of other malignant neoplasms of lymphoid, hematopoietic and related tissues: Secondary | ICD-10-CM | POA: Diagnosis not present

## 2017-12-04 DIAGNOSIS — Z880 Allergy status to penicillin: Secondary | ICD-10-CM | POA: Diagnosis not present

## 2017-12-04 DIAGNOSIS — Z79899 Other long term (current) drug therapy: Secondary | ICD-10-CM | POA: Diagnosis not present

## 2017-12-04 DIAGNOSIS — Z882 Allergy status to sulfonamides status: Secondary | ICD-10-CM | POA: Insufficient documentation

## 2017-12-04 DIAGNOSIS — Z08 Encounter for follow-up examination after completed treatment for malignant neoplasm: Secondary | ICD-10-CM | POA: Diagnosis not present

## 2017-12-04 DIAGNOSIS — Z888 Allergy status to other drugs, medicaments and biological substances status: Secondary | ICD-10-CM | POA: Diagnosis not present

## 2017-12-04 DIAGNOSIS — C770 Secondary and unspecified malignant neoplasm of lymph nodes of head, face and neck: Secondary | ICD-10-CM | POA: Insufficient documentation

## 2017-12-04 DIAGNOSIS — D490 Neoplasm of unspecified behavior of digestive system: Secondary | ICD-10-CM | POA: Diagnosis not present

## 2017-12-04 DIAGNOSIS — Z7982 Long term (current) use of aspirin: Secondary | ICD-10-CM | POA: Diagnosis not present

## 2017-12-04 DIAGNOSIS — Z859 Personal history of malignant neoplasm, unspecified: Secondary | ICD-10-CM | POA: Diagnosis not present

## 2017-12-04 NOTE — Progress Notes (Signed)
Radiation Oncology         [phone] ________________________________  Name: Mario Berg MRN: [id number]  Date: 12/04/2017  DOB: 12/13/1940  Follow-Up Visit Note  CC: [name], [name], MD  Rozetta Nunnery, *  Diagnosis and Prior Radiotherapy:       ICD-10-CM   1. Secondary and unspecified malignant neoplasm of lymph nodes of head, face and neck ([place]) C77.0     CHIEF COMPLAINT:  Here for follow-up and surveillance of his head and neck cancer  Narrative:  The patient returns today for routine follow-up.  He reports he saw a Dermatologist for the mass to the right side of his nose -  This was biopsied, and he reports it showed cancer, and treatment was recommended.  However the lesion "fell off" after biopsy and he did not return to the clinic for treatment as recommended.  PET scan was performed 10-1 or restaging of his H+N cancer.  It was reviewed this AM at ENT tumor board.  His neck disease appeared to have completely responded to RT with minimal scar tissue remaining.  His does have a new area of hypermetabolic activity in the pancreas for which work up is recommended.  Pain issues, if any: He denies pain.  Using a feeding tube?: N/A Weight changes, if any:  Wt Readings from Last 3 Encounters:  12/04/17 149 lb 6 oz (67.8 kg)  10/28/17 154 lb 12.8 oz (70.2 kg)  08/27/17 159 lb 3.2 oz (72.2 kg)   Swallowing issues, if any: He is having difficulty swallowing. He tells me that he gets choked at times on food.  Smoking or chewing tobacco? He is not smoking cigarettes. He does smoke marijuana almost daily (small amount) Using fluoride trays daily?  Last ENT visit was on: Dr. Lucia Gaskins last April 2019 Other notable issues, if any:  He has thick mucous to his throat; He has tried to use mucinex without good results. He is drinking gingerale to help with mucous control    ALLERGIES:  is allergic to penicillins; atorvastatin; furosemide; bystolic [nebivolol hcl]; clams  [shellfish allergy]; clonidine derivatives; gemfibrozil; metoprolol tartrate; sulfonamide derivatives; tekturna [aliskiren fumarate]; terazosin hcl; and verapamil.  Meds: Current Outpatient Medications  Medication Sig Dispense Refill  . aspirin EC 81 MG tablet Take 1 tablet (81 mg total) by mouth daily. 100 tablet 3  . Cholecalciferol (VITAMIN D3) 1000 units CAPS Take by mouth.    . Coenzyme Q10 (CO Q 10) 100 MG CAPS Take 100 mg by mouth daily.    . indomethacin (INDOCIN) 25 MG capsule Take 25 mg by mouth 3 (three) times daily as needed.    . isosorbide mononitrate (IMDUR) 30 MG 24 hr tablet Take 1 tablet (30 mg total) by mouth daily. 90 tablet 3  . lisinopril (PRINIVIL,ZESTRIL) 40 MG tablet Take 1 tablet (40 mg total) by mouth 2 (two) times daily. 60 tablet 11  . Multiple Vitamin (MULTIVITAMIN) tablet Take 1 tablet by mouth daily.     . Omega-3 Fatty Acids (FISH OIL) 1200 MG CAPS Take 1,200 mg by mouth daily.     Marland Kitchen Ophthalmic Irrigation Solution (EYE WASH) 99.05 % SOLN Apply 1 application to eye daily.    Marland Kitchen oxymetazoline (AFRIN) 0.05 % nasal spray Place 1 spray into both nostrils 2 (two) times daily as needed for congestion.    . lidocaine (XYLOCAINE) 2 % solution Patient: Mix 1part 2% viscous lidocaine, 1part H20. Swish &swallow 42mL of diluted mixture, 7min before meals and  at bedtime, up to QID (Patient not taking: Reported on 12/04/2017) 100 mL 5  . nitroGLYCERIN (NITROSTAT) 0.4 MG SL tablet PLACE 1 TABLET UNDER THE TONGUE EVERY 5 MINUTES AS NEEDED FOR CHEST PAIN 20 tablet 2   No current facility-administered medications for this encounter.     Physical Findings: The patient is in no acute distress. Patient is alert and oriented. Wt Readings from Last 3 Encounters:  12/04/17 149 lb 6 oz (67.8 kg)  10/28/17 154 lb 12.8 oz (70.2 kg)  08/27/17 159 lb 3.2 oz (72.2 kg)    height is 5\' 8"  (1.727 m) and weight is 149 lb 6 oz (67.8 kg). His oral temperature is 98 F (36.7 C). His blood  pressure is 169/63 (abnormal) and his pulse is 61. His respiration is 18 and oxygen saturation is 100%. .  General: Alert and oriented, in no acute distress HEENT: Head is normocephalic. Extraocular movements are intact. Neck: no masses palpated bilaterally Skin: Skin in treatment fields shows satisfactory healing. Pearly, raised mass remaining over the right lateral nose at the supra alar crease is no longer present.  Lymphatics: see Neck Exam   Lab Findings: Lab Results  Component Value Date   WBC 7.7 06/28/2017   HGB 12.6 (L) 06/28/2017   HCT 38.0 (L) 06/28/2017   MCV 93.1 06/28/2017   PLT 185 06/28/2017    Lab Results  Component Value Date   TSH 1.83 03/26/2017    Radiographic Findings: Nm Pet Image Restag (ps) Skull Base To Thigh  Result Date: 12/04/2017 CLINICAL DATA:  Subsequent treatment strategy for head and neck cancer. EXAM: NUCLEAR MEDICINE PET SKULL BASE TO THIGH TECHNIQUE: 7.9 mCi F-18 FDG was injected intravenously. Full-ring PET imaging was performed from the skull base to thigh after the radiotracer. CT data was obtained and used for attenuation correction and anatomic localization. Fasting blood glucose: 112 mg/dl COMPARISON:  None. FINDINGS: Mediastinal blood pool activity: SUV max 2.5 NECK: Left neck mass measured previously at 3.7 x 2.7 cm has decreased substantially in the interval measuring 1.3 x 0.7 cm today. Low level FDG accumulation in the lesion identified on today's study with SUV max = 2.7 compared to 12 previously. No new hypermetabolic disease in the neck. Incidental CT findings: none CHEST: No hypermetabolic mediastinal or hilar nodes. No suspicious pulmonary nodules on the CT scan. Incidental CT findings: Coronary artery calcification is evident. Atherosclerotic calcification is noted in the wall of the thoracic aorta. Mucous evident in the distal trachea. No suspicious pulmonary nodule or mass. No pleural effusion. ABDOMEN/PELVIS: Interval development of  a new 1.7 x 1.7 cm markedly hypermetabolic soft tissue nodule along the anterior margin of the pancreatic head. SUV max = 8.1. This is associated with diffuse low level uptake in the pancreatic parenchyma, increased in the interval. No dilatation of the main pancreatic duct. Incidental CT findings: There is abdominal aortic atherosclerosis without aneurysm. 7 mm interpolar cyst in the right kidney is stable. Diffuse colonic diverticulosis without diverticulitis. Prostate gland is enlarged. SKELETON: Diffusely mottled FDG uptake identified within the bony anatomy, nonspecific. Incidental CT findings: Bilateral pars interarticularis defects are seen at L5. IMPRESSION: 1. Marked interval decrease in size and hypermetabolism of the patient's known left neck mass. 2. Interval development of a hypermetabolic 1.7 cm soft tissue lesion in or adjacent to the anterior head of pancreas. This is associated with diffuse low level FDG uptake in the pancreatic parenchyma but no dilatation of the main pancreatic duct. Metastatic deposit would  be a consideration. Primary pancreatic neoplasm is considered less likely but not excluded. MRI of the abdomen without and with contrast may prove helpful to further evaluate. EUS follow-up would also be a consideration and might allow for tissue sampling. 3. Low level mottled uptake in the bone marrow, indeterminate. Attention on follow-up recommended. 4.  Aortic Atherosclerois (ICD10-170.0) Electronically Signed   By: Misty Stanley M.D.   On: 12/04/2017 08:34    Impression/Plan:    1) Head and Neck Cancer Status: NED in H+N region - small flattened neck mass remaining on PET favored to be scar tissue Patient not following all medical advice by dermatologist though mass along nose has fallen off  2) Nutritional Status: no active issues  3) Risk Factors: use good sun hygiene  4) Swallowing: functional, and taking all nutrition by mouth  5) Thyroid function:  Lab Results    Component Value Date   TSH 1.83 03/26/2017    6) Other:Today we had a long discussion about further workup for the possible pancreatic mass/tumor on PET.  He is not sure he wants to undergo more procedures.  He is concerned about the cost of appointments and procedures and is also questioning followup with Dr Lucia Gaskins for surveillance. He will think about things.  7) Follow-up with ENT in 3 months.  Gayleen Orem, RN, our Head and Neck Oncology Navigator will arrange. Followup with me in 6 mo.  ADDENDUM:  I called the patient on 12-06-17 after speaking with a surgical oncologist about his pancreatic lesion on PET; she states he would not be a surgical candidate due to his cardiac issues.  She recommended EUS with biopsy if a mass is found.  The patient would like to continue to think about this.  He doesn't want any imaging for surveillance or biopsies at this time. He understands this could be a cancer, or a benign incidental finding - it is hard to know.  He understands we will not schedule any further imaging or procedures unless he expresses interest, he knows how to call Gayleen Orem, RN, our Head and Neck Oncology Navigator with his preferences or questions.   I spent 25 minutes face to face with the patient and more than 50% of that time was spent in counseling and/or coordination of care. _____________________________________   Eppie Gibson, MD

## 2017-12-05 ENCOUNTER — Telehealth: Payer: Self-pay | Admitting: *Deleted

## 2017-12-05 NOTE — Telephone Encounter (Signed)
Oncology Nurse Navigator Documentation  Per Dr. Pearlie Oyster guidance, called ENT Dr. Pollie Friar office to arrange appt.   Spoke with Olin Hauser, requested patient be contacted and appt arranged for routine post-tmt follow-up with Dr. Lucia Gaskins in 3 months.  She verbalized understanding.  Gayleen Orem, RN, BSN Head & Neck Oncology Nurse [place] [facility] [place] [phone]

## 2017-12-05 NOTE — Progress Notes (Signed)
Oncology Nurse Navigator Documentation  To provide support, encouragement and care continuity, met with Mr. Arterberry during f/u with Dr. Isidore Moos which included discussion of re-staging PET.  He completed RT to L neck 08/13/17. He voiced understanding yesterday's PET indicated NED in area treated though showed area of concern in pancreas.  He declined Dr. Pearlie Oyster recommendation for MRI for further evaluation. He reported:  Continuing production of thick saliva which interferes with eating/drinking at pre-tmt baseline.  He indicated he is drinking ginger ale (per previous guidance) which provides some mgt of secretions.  Drinking 2-3 beer daily.  Suspected basal cell carcinoma on L nostril identified during previous appt "washed off", therefore he did not follow-up with dermatologist per Dr. Pearlie Oyster guidance. He voiced understanding of plan to see ENT Dr. Lucia Gaskins in 3 months, Dr. Isidore Moos in 6 months with CT neck if he agrees. I encouraged him to call me with needs/concerns, he confirmed he has my phone #. Navigator Interventions  To coordinate 3 month follow-up with ENT Dr. Lucia Gaskins.  Gayleen Orem, RN, BSN Head & Neck Oncology Nurse [place] [place] [phone]

## 2017-12-06 ENCOUNTER — Other Ambulatory Visit: Payer: Self-pay | Admitting: Radiation Oncology

## 2017-12-09 ENCOUNTER — Encounter: Payer: Self-pay | Admitting: Radiation Oncology

## 2018-01-16 ENCOUNTER — Telehealth: Payer: Self-pay

## 2018-01-16 NOTE — Telephone Encounter (Signed)
Per 10/28/17 office visit with Dr. Burt Knack, the patient was to call when he decided he wanted to arrange heart catheterization. He requested if he had not called in a couple months to call for an update.  Called the patient to discuss heart catheterization per his request.  He states he understands his "cardiac situation" and does not wish to proceed with any testing at this time and will call if he changes his mind.  He was grateful for call.

## 2018-01-17 NOTE — Telephone Encounter (Signed)
Dr. Burt Knack notified.

## 2018-03-13 DIAGNOSIS — C770 Secondary and unspecified malignant neoplasm of lymph nodes of head, face and neck: Secondary | ICD-10-CM | POA: Diagnosis not present

## 2018-04-17 DIAGNOSIS — C44329 Squamous cell carcinoma of skin of other parts of face: Secondary | ICD-10-CM | POA: Diagnosis not present

## 2018-04-17 DIAGNOSIS — D485 Neoplasm of uncertain behavior of skin: Secondary | ICD-10-CM | POA: Diagnosis not present

## 2018-05-06 ENCOUNTER — Other Ambulatory Visit: Payer: Self-pay | Admitting: Internal Medicine

## 2018-06-06 ENCOUNTER — Ambulatory Visit
Admission: RE | Admit: 2018-06-06 | Discharge: 2018-06-06 | Disposition: A | Discharge status: Home / Self Care | Payer: Medicare Other | Source: Ambulatory Visit | Attending: Radiation Oncology | Admitting: Radiation Oncology

## 2018-06-06 ENCOUNTER — Encounter: Payer: Self-pay | Admitting: Radiation Oncology

## 2018-06-06 DIAGNOSIS — R935 Abnormal findings on diagnostic imaging of other abdominal regions, including retroperitoneum: Secondary | ICD-10-CM | POA: Diagnosis not present

## 2018-06-06 DIAGNOSIS — Z08 Encounter for follow-up examination after completed treatment for malignant neoplasm: Secondary | ICD-10-CM | POA: Diagnosis not present

## 2018-06-06 DIAGNOSIS — C770 Secondary and unspecified malignant neoplasm of lymph nodes of head, face and neck: Secondary | ICD-10-CM

## 2018-06-06 NOTE — Progress Notes (Signed)
Radiation Oncology         [phone] ________________________________  Name: Mario Berg MRN: [id number]  Date: 06/06/2018  DOB: [birth year]  Follow-Up Telephone Note  CC: [name], [name], MD  Rozetta Nunnery, *  Diagnosis and Prior Radiotherapy:       ICD-10-CM   1. Secondary and unspecified malignant neoplasm of lymph nodes of head, face and neck ([place]) C77.0     07/16/2017 - 08/13/2017: Left Neck / 53 Gy in 20 fractions  CHIEF COMPLAINT:  Here for follow-up and surveillance of his head and neck cancer  Narrative:  The patient was contacted via telephone for routine follow-up as he cannot access Web Ex, and was glad to do this during pandemic to reduce risk of getting the coronavirus. Since his last visit, he underwent skin biopsy with shave and ED&C for a nodule on his right inferior lateral zygoma on 04/17/2018. Pathology revealed well differentiated squamous cell carcinoma.  Patient reports he has no new lumps or bumps in his neck.   He is losing weight and states this is c/w a chronic trend for 4-5 yrs.  Currently 135lb despite pushing PO intake.  Wt Readings from Last 3 Encounters:  12/04/17 149 lb 6 oz (67.8 kg)  10/28/17 154 lb 12.8 oz (70.2 kg)  08/27/17 159 lb 3.2 oz (72.2 kg)   He states he is avoiding his PCP when called to schedule wellness checks.   He is self isolating during the pandemic.   ALLERGIES:  is allergic to penicillins; atorvastatin; furosemide; bystolic [nebivolol hcl]; clams [shellfish allergy]; clonidine derivatives; gemfibrozil; metoprolol tartrate; sulfonamide derivatives; tekturna [aliskiren fumarate]; terazosin hcl; and verapamil.  Meds: Current Outpatient Medications  Medication Sig Dispense Refill  . aspirin EC 81 MG tablet Take 1 tablet (81 mg total) by mouth daily. 100 tablet 3  . Cholecalciferol (VITAMIN D3) 1000 units CAPS Take by mouth.    . Coenzyme Q10 (CO Q 10) 100 MG CAPS Take 100 mg by mouth daily.    . indomethacin  (INDOCIN) 25 MG capsule Take 25 mg by mouth 3 (three) times daily as needed.    . isosorbide mononitrate (IMDUR) 30 MG 24 hr tablet Take 1 tablet by mouth once daily 90 tablet 3  . lidocaine (XYLOCAINE) 2 % solution Patient: Mix 1part 2% viscous lidocaine, 1part H20. Swish &swallow 24mL of diluted mixture, 35min before meals and at bedtime, up to QID (Patient not taking: Reported on 12/04/2017) 100 mL 5  . lisinopril (PRINIVIL,ZESTRIL) 40 MG tablet Take 1 tablet (40 mg total) by mouth 2 (two) times daily. 60 tablet 11  . Multiple Vitamin (MULTIVITAMIN) tablet Take 1 tablet by mouth daily.     . nitroGLYCERIN (NITROSTAT) 0.4 MG SL tablet PLACE 1 TABLET UNDER THE TONGUE EVERY 5 MINUTES AS NEEDED FOR CHEST PAIN 20 tablet 2  . Omega-3 Fatty Acids (FISH OIL) 1200 MG CAPS Take 1,200 mg by mouth daily.     Marland Kitchen Ophthalmic Irrigation Solution (EYE WASH) 99.05 % SOLN Apply 1 application to eye daily.    Marland Kitchen oxymetazoline (AFRIN) 0.05 % nasal spray Place 1 spray into both nostrils 2 (two) times daily as needed for congestion.     No current facility-administered medications for this encounter.     Physical Findings: NA   Lab Findings: Lab Results  Component Value Date   WBC 7.7 06/28/2017   HGB 12.6 (L) 06/28/2017   HCT 38.0 (L) 06/28/2017   MCV 93.1 06/28/2017   PLT  185 06/28/2017    Lab Results  Component Value Date   TSH 1.83 03/26/2017    Radiographic Findings: No results found.  Impression/Plan:   Previously, at his prior followup, we had a long discussion about further workup for the possible pancreatic mass/tumor on PET.  He is not sure he wants to undergo more procedures. (He continues to wish to avoid medical appts, ie with his PCP.) I called the patient on 12-06-17 after speaking with a surgical oncologist about his pancreatic lesion on PET; she states he would not be a surgical candidate due to his cardiac issues.  She recommended EUS with biopsy if a mass is found.  The patient still  doesn't want any imaging for surveillance or biopsies at this time. He understands this could be a cancer, or a benign incidental finding - it is hard to know.  He understands we will not schedule any further imaging or procedures unless he expresses interest, he knows how to call Gayleen Orem, RN, our Head and Neck Oncology Navigator with his preferences or questions.  He still feels this way even though he is losing weight.  Therefore, I'll see him without any procedures or scans in 75mo.  This encounter was provided by telephone as he cannot access WebEx.  The patient has given verbal consent for this type of encounter and has been advised to only accept a meeting of this type in a secure network environment. The time spent during this encounter was 12 minutes. The attendants for this meeting include Eppie Gibson  and Tyrone Sage.  During the encounter, Eppie Gibson was located at [facility] Radiation Oncology Department.  KAMDIN FOLLETT was located at home.   _____________________________________   Eppie Gibson, MD  This document serves as a record of services personally performed by Eppie Gibson, MD. It was created on her behalf by Wilburn Mylar, a trained medical scribe. The creation of this record is based on the scribe's personal observations and the provider's statements to them. This document has been checked and approved by the attending provider.

## 2018-06-09 ENCOUNTER — Telehealth: Payer: Self-pay | Admitting: *Deleted

## 2018-06-09 NOTE — Telephone Encounter (Signed)
CALLED PATIENT TO INFORM OF FU FOR 09-12-18 @ 2 PM, SPOKE WITH PATIENT AND HE IS AWARE OF THIS APPT.

## 2018-08-07 ENCOUNTER — Other Ambulatory Visit: Payer: Self-pay | Admitting: Cardiology

## 2018-09-10 NOTE — Progress Notes (Signed)
error 

## 2018-09-12 ENCOUNTER — Ambulatory Visit
Admission: RE | Admit: 2018-09-12 | Discharge: 2018-09-12 | Disposition: A | Discharge status: Home / Self Care | Payer: Medicare Other | Source: Ambulatory Visit | Attending: Radiation Oncology | Admitting: Radiation Oncology

## 2018-09-12 ENCOUNTER — Encounter: Payer: Self-pay | Admitting: Radiation Oncology

## 2018-09-12 DIAGNOSIS — C770 Secondary and unspecified malignant neoplasm of lymph nodes of head, face and neck: Secondary | ICD-10-CM | POA: Diagnosis not present

## 2018-09-12 DIAGNOSIS — Z08 Encounter for follow-up examination after completed treatment for malignant neoplasm: Secondary | ICD-10-CM | POA: Diagnosis not present

## 2018-09-12 DIAGNOSIS — C801 Malignant (primary) neoplasm, unspecified: Secondary | ICD-10-CM | POA: Diagnosis not present

## 2018-09-12 NOTE — Progress Notes (Signed)
Radiation Oncology         [phone] ________________________________  Name: Mario Berg MRN: [id number]  Date: 09/12/2018  DOB: April 21, 1940  Follow-Up Telephone Note by phone due to patient preference and pandemic risks  CC: [name], [name], MD  Melony Overly E, *  Diagnosis and Prior Radiotherapy:     No diagnosis found.  07/16/2017 - 08/13/2017: Left Neck / 53 Gy in 20 fractions  CHIEF COMPLAINT:   head and neck cancer  Narrative:  The patient returns today for routine follow up. He feels "fair to lousy."  The "therapy we went through seems to have worked just fine."  No masses felt in neck, no pain in neck.  Still has a "bad heart."  Dealing with angina. Taking nitroglycerin, doesn't want to reconsider stenting, doesn't want to go to the hospital due to pandemic conditions.    He reports he is 133.4lb. He was 135lb in April.  Wt Readings from Last 3 Encounters:  12/04/17 149 lb 6 oz (67.8 kg)  10/28/17 154 lb 12.8 oz (70.2 kg)  08/27/17 159 lb 3.2 oz (72.2 kg)     ALLERGIES:  is allergic to penicillins; atorvastatin; furosemide; bystolic [nebivolol hcl]; clams [shellfish allergy]; clonidine derivatives; gemfibrozil; metoprolol tartrate; sulfonamide derivatives; tekturna [aliskiren fumarate]; terazosin hcl; and verapamil.  Meds: Current Outpatient Medications  Medication Sig Dispense Refill  . aspirin EC 81 MG tablet Take 1 tablet (81 mg total) by mouth daily. 100 tablet 3  . Cholecalciferol (VITAMIN D3) 1000 units CAPS Take by mouth.    . Coenzyme Q10 (CO Q 10) 100 MG CAPS Take 100 mg by mouth daily.    . indomethacin (INDOCIN) 25 MG capsule Take 25 mg by mouth 3 (three) times daily as needed.    . isosorbide mononitrate (IMDUR) 30 MG 24 hr tablet Take 1 tablet by mouth once daily 90 tablet 3  . lidocaine (XYLOCAINE) 2 % solution Patient: Mix 1part 2% viscous lidocaine, 1part H20. Swish &swallow 1mL of diluted mixture, 50min before meals and at bedtime, up to  QID (Patient not taking: Reported on 12/04/2017) 100 mL 5  . lisinopril (ZESTRIL) 40 MG tablet Take 1 tablet (40 mg total) by mouth 2 (two) times daily. Pt needs to call and make appt by Aug for further refills 180 tablet 0  . Multiple Vitamin (MULTIVITAMIN) tablet Take 1 tablet by mouth daily.     . nitroGLYCERIN (NITROSTAT) 0.4 MG SL tablet PLACE 1 TABLET UNDER THE TONGUE EVERY 5 MINUTES AS NEEDED FOR CHEST PAIN 25 tablet 0  . Omega-3 Fatty Acids (FISH OIL) 1200 MG CAPS Take 1,200 mg by mouth daily.     Marland Kitchen Ophthalmic Irrigation Solution (EYE WASH) 99.05 % SOLN Apply 1 application to eye daily.    Marland Kitchen oxymetazoline (AFRIN) 0.05 % nasal spray Place 1 spray into both nostrils 2 (two) times daily as needed for congestion.     No current facility-administered medications for this encounter.     Physical Findings: NA   Lab Findings: Lab Results  Component Value Date   WBC 7.7 06/28/2017   HGB 12.6 (L) 06/28/2017   HCT 38.0 (L) 06/28/2017   MCV 93.1 06/28/2017   PLT 185 06/28/2017    Lab Results  Component Value Date   TSH 1.83 03/26/2017    Radiographic Findings: No results found.  Impression/Plan:  Patient declines in-person exam / visit today due to concerns about pandemic risks. By report, he is without local/regional signs of recurrence.  He declines workup for his weight loss, and prefers not to undergo aggressive measures for his cardiac disease.  He had a pancreatic lesion on imaging in the past that he declined working up.  I will provide support as able and as he is comfortable receiving. Will f/u with him in 34mo, sooner if needed.    This encounter was provided by telemedicine platform phone.  The patient has given verbal consent for this type of encounter and has been advised to only accept a meeting of this type in a secure network environment. The time spent during this encounter was over 15 minutes. The attendants for this meeting include Eppie Gibson  and Tyrone Sage.  During the encounter, Eppie Gibson was located at [facility] Radiation Oncology Department.  Mario Berg was located at home.    _____________________________________   Eppie Gibson, MD  This document serves as a record of services personally performed by Eppie Gibson, MD. It was created on her behalf by Wilburn Mylar, a trained medical scribe. The creation of this record is based on the scribe's personal observations and the provider's statements to them. This document has been checked and approved by the attending provider.

## 2018-09-15 ENCOUNTER — Other Ambulatory Visit: Payer: Self-pay | Admitting: Cardiology

## 2018-09-15 ENCOUNTER — Telehealth: Payer: Self-pay | Admitting: *Deleted

## 2018-09-15 NOTE — Telephone Encounter (Signed)
CALLED PATIENT TO INFORM OF 6 MONTH FU WITH DR. Isidore Moos ON 03-17-19 @ 2:20 PM, LVM FOR A RETURN CALL

## 2018-10-02 ENCOUNTER — Other Ambulatory Visit: Payer: Self-pay | Admitting: Cardiology

## 2018-10-23 ENCOUNTER — Telehealth: Payer: Self-pay | Admitting: Cardiovascular Disease

## 2018-10-23 NOTE — Telephone Encounter (Signed)
Death certificate received from [employer]. Placed in box for Dr. Burt Knack to sign. 10/23/18 vlm

## 2018-11-04 DEATH — deceased

## 2019-03-17 ENCOUNTER — Ambulatory Visit: Payer: Self-pay | Admitting: Radiation Oncology
